# Patient Record
Sex: Female | Born: 1955 | Race: White | Hispanic: No | Marital: Married | Smoking: Former smoker
Health system: Southern US, Community
[De-identification: ages and names within clinical notes are randomized; demographics above are authoritative.]

## PROBLEM LIST (undated history)

## (undated) DIAGNOSIS — N898 Other specified noninflammatory disorders of vagina: Secondary | ICD-10-CM

## (undated) DIAGNOSIS — N941 Unspecified dyspareunia: Secondary | ICD-10-CM

## (undated) DIAGNOSIS — N905 Atrophy of vulva: Secondary | ICD-10-CM

## (undated) DIAGNOSIS — K5792 Diverticulitis of intestine, part unspecified, without perforation or abscess without bleeding: Secondary | ICD-10-CM

## (undated) DIAGNOSIS — M48061 Spinal stenosis, lumbar region without neurogenic claudication: Secondary | ICD-10-CM

## (undated) DIAGNOSIS — M5020 Other cervical disc displacement, unspecified cervical region: Secondary | ICD-10-CM

## (undated) DIAGNOSIS — S0300XA Dislocation of jaw, unspecified side, initial encounter: Secondary | ICD-10-CM

## (undated) HISTORY — DX: Unspecified dyspareunia: N94.10

## (undated) HISTORY — DX: Other specified noninflammatory disorders of vagina: N89.8

## (undated) HISTORY — DX: Atrophy of vulva: N90.5

## (undated) HISTORY — PX: COLONOSCOPY: SHX174

## (undated) HISTORY — DX: Other cervical disc displacement, unspecified cervical region: M50.20

## (undated) HISTORY — PX: MELANOMA EXCISION: SHX5266

## (undated) HISTORY — PX: TUBAL LIGATION: SHX77

## (undated) HISTORY — PX: BLADDER SUSPENSION: SHX72

---
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
## 1992-08-04 DIAGNOSIS — C439 Malignant melanoma of skin, unspecified: Secondary | ICD-10-CM

## 1992-08-04 HISTORY — DX: Malignant melanoma of skin, unspecified: C43.9

## 1992-08-04 HISTORY — PX: FLEXIBLE SIGMOIDOSCOPY: SHX1649

## 1992-08-04 HISTORY — PX: ABDOMINAL HYSTERECTOMY: SHX81

## 1992-08-04 HISTORY — PX: LAPAROSCOPIC OVARIAN CYSTECTOMY: SHX6248

## 1999-05-06 ENCOUNTER — Encounter: Admission: RE | Admit: 1999-05-06 | Discharge: 1999-05-06 | Payer: Self-pay | Admitting: Sports Medicine

## 1999-06-03 ENCOUNTER — Encounter: Admission: RE | Admit: 1999-06-03 | Discharge: 1999-06-03 | Payer: Self-pay | Admitting: Sports Medicine

## 1999-07-01 ENCOUNTER — Encounter: Admission: RE | Admit: 1999-07-01 | Discharge: 1999-07-01 | Payer: Self-pay | Admitting: Sports Medicine

## 1999-07-26 ENCOUNTER — Encounter: Admission: RE | Admit: 1999-07-26 | Discharge: 1999-07-26 | Payer: Self-pay | Admitting: Sports Medicine

## 2000-04-09 ENCOUNTER — Encounter: Admission: RE | Admit: 2000-04-09 | Discharge: 2000-04-09 | Payer: Self-pay | Admitting: Family Medicine

## 2004-11-21 ENCOUNTER — Ambulatory Visit: Payer: Self-pay

## 2005-07-30 ENCOUNTER — Ambulatory Visit: Payer: Self-pay | Admitting: Dermatology

## 2005-11-28 ENCOUNTER — Ambulatory Visit: Payer: Self-pay

## 2005-12-05 ENCOUNTER — Ambulatory Visit: Payer: Self-pay

## 2006-04-25 ENCOUNTER — Ambulatory Visit: Payer: Self-pay | Admitting: Physician Assistant

## 2006-12-11 ENCOUNTER — Inpatient Hospital Stay: Payer: Self-pay | Admitting: Internal Medicine

## 2006-12-16 ENCOUNTER — Ambulatory Visit: Payer: Self-pay

## 2007-03-19 ENCOUNTER — Ambulatory Visit: Payer: Self-pay | Admitting: Unknown Physician Specialty

## 2007-10-18 ENCOUNTER — Ambulatory Visit: Payer: Self-pay | Admitting: Dermatology

## 2008-03-07 ENCOUNTER — Ambulatory Visit: Payer: Self-pay

## 2008-08-14 ENCOUNTER — Ambulatory Visit: Payer: Self-pay | Admitting: Sports Medicine

## 2008-08-14 DIAGNOSIS — M722 Plantar fascial fibromatosis: Secondary | ICD-10-CM | POA: Insufficient documentation

## 2008-08-14 DIAGNOSIS — M7750 Other enthesopathy of unspecified foot: Secondary | ICD-10-CM | POA: Insufficient documentation

## 2008-08-14 DIAGNOSIS — M79609 Pain in unspecified limb: Secondary | ICD-10-CM | POA: Insufficient documentation

## 2009-03-08 ENCOUNTER — Ambulatory Visit: Payer: Self-pay

## 2010-01-16 ENCOUNTER — Ambulatory Visit: Payer: Self-pay | Admitting: Podiatry

## 2010-02-06 ENCOUNTER — Ambulatory Visit: Payer: Self-pay | Admitting: Internal Medicine

## 2010-03-13 ENCOUNTER — Ambulatory Visit: Payer: Self-pay

## 2010-04-26 ENCOUNTER — Encounter: Admission: RE | Admit: 2010-04-26 | Discharge: 2010-04-26 | Payer: Self-pay | Admitting: Internal Medicine

## 2010-09-09 ENCOUNTER — Other Ambulatory Visit: Payer: Self-pay | Admitting: Internal Medicine

## 2010-09-09 ENCOUNTER — Ambulatory Visit: Payer: Self-pay | Admitting: Internal Medicine

## 2011-04-01 ENCOUNTER — Ambulatory Visit: Payer: Self-pay

## 2012-03-30 ENCOUNTER — Ambulatory Visit: Payer: Self-pay | Admitting: General Practice

## 2012-04-21 ENCOUNTER — Ambulatory Visit: Payer: Self-pay | Admitting: Obstetrics and Gynecology

## 2013-04-28 ENCOUNTER — Ambulatory Visit: Payer: Self-pay | Admitting: Obstetrics and Gynecology

## 2013-05-03 ENCOUNTER — Ambulatory Visit: Payer: Self-pay | Admitting: Obstetrics and Gynecology

## 2013-07-12 ENCOUNTER — Ambulatory Visit: Payer: Self-pay | Admitting: Internal Medicine

## 2013-12-20 ENCOUNTER — Encounter: Payer: Self-pay | Admitting: Internal Medicine

## 2014-01-02 ENCOUNTER — Encounter: Payer: Self-pay | Admitting: Internal Medicine

## 2014-01-02 HISTORY — PX: CYSTOURETHROSCOPY: SHX476

## 2014-02-01 ENCOUNTER — Encounter: Payer: Self-pay | Admitting: Internal Medicine

## 2014-05-12 ENCOUNTER — Ambulatory Visit: Payer: Self-pay | Admitting: Obstetrics and Gynecology

## 2014-08-04 DIAGNOSIS — M8580 Other specified disorders of bone density and structure, unspecified site: Secondary | ICD-10-CM

## 2014-08-04 HISTORY — DX: Other specified disorders of bone density and structure, unspecified site: M85.80

## 2015-05-14 ENCOUNTER — Encounter: Payer: Self-pay | Admitting: *Deleted

## 2015-05-17 ENCOUNTER — Ambulatory Visit (INDEPENDENT_AMBULATORY_CARE_PROVIDER_SITE_OTHER): Payer: Managed Care, Other (non HMO) | Admitting: Obstetrics and Gynecology

## 2015-05-17 ENCOUNTER — Encounter: Payer: Self-pay | Admitting: Obstetrics and Gynecology

## 2015-05-17 VITALS — BP 96/76 | HR 96 | Ht 65.5 in | Wt 150.0 lb

## 2015-05-17 DIAGNOSIS — Z01419 Encounter for gynecological examination (general) (routine) without abnormal findings: Secondary | ICD-10-CM | POA: Diagnosis not present

## 2015-05-17 MED ORDER — PROGESTERONE MICRONIZED 200 MG PO CAPS: 200.0000 mg | ORAL_CAPSULE | Freq: Every day | ORAL | Status: DC

## 2015-05-17 MED ORDER — BUPROPION HCL ER (SR) 150 MG PO TB12: 150.0000 mg | ORAL_TABLET | Freq: Every day | ORAL | Status: DC

## 2015-05-17 MED ORDER — ESTRADIOL 0.5 MG PO TABS: 0.5000 mg | ORAL_TABLET | Freq: Every day | ORAL | Status: DC

## 2015-05-17 MED ORDER — BUPROPION HCL 75 MG PO TABS: 75.0000 mg | ORAL_TABLET | Freq: Two times a day (BID) | ORAL | Status: DC

## 2015-05-17 NOTE — Progress Notes (Signed)
Subjective:   Kathryn Hawkins is a 59 y.o. No obstetric history on file. Caucasian female here for a routine well-woman exam.  No LMP recorded. Patient is postmenopausal.    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  Past Medical History Past Medical History  Diagnosis Date  . Hypothyroidism   . Herniated disc, cervical   . Vaginal dryness   . Dyspareunia in female   . Vulvar atrophy     Past Surgical History Past Surgical History  Procedure Laterality Date  . Abdominal hysterectomy  1994    Gynecologic History No obstetric history on file.  No LMP recorded. Patient is postmenopausal. Contraception: post menopausal status Last Pap: 2015. Results were: normal Last mammogram: 2015. Results were: normal  Obstetric History OB History  No data available    Current Medications No current outpatient prescriptions on file prior to visit.   No current facility-administered medications on file prior to visit.    Review of Systems Patient denies any headaches, blurred vision, shortness of breath, chest pain, abdominal pain, problems with bowel movements, urination, or intercourse.  Objective:  BP 96/76 mmHg  Pulse 96  Ht 5' 5.5" (1.664 m)  Wt 150 lb (68.04 kg)  BMI 24.57 kg/m2 Physical Exam  General:  Well developed, well nourished, no acute distress. She is alert and oriented x3. Skin:  Warm and dry Neck:  Midline trachea, no thyromegaly or nodules Cardiovascular: Regular rate and rhythm, no murmur heard Lungs:   Effort normal, all lung fields clear to auscultation bilaterally Breasts:  No dominant palpable mass, retraction, or nipple discharge Abdomen:  Soft, non tender, no hepatosplenomegaly or masses Pelvic:  External genitalia is normal in appearance.  The vagina is normal in appearance. The cervix is bulbous, no CMT.  Thin prep pap is not done . Uterus is felt to be normal size, shape, and contour.  No adnexal masses or tenderness noted. Extremities:  No swelling or varicosities noted Psych:  She has a normal mood and affect  Assessment:   Healthy well-woman exam postmenopausaul HRT Anxiety under good control with SSRI osteopenia  Plan:  Routine screening labs obtained  F/U 1 year for AE, or sooner if needed Mammogram scheduled Sharhonda Atwood Valene Bors, CNM

## 2015-05-17 NOTE — Patient Instructions (Signed)
  Place annual gynecologic exam patient instructions here.  Thank you for enrolling in [facility]. Please follow the instructions below to securely access your online medical record. MyChart allows you to send messages to your doctor, view your test results, manage appointments, and more.   How Do I Sign Up? 1. In your Internet browser, go to [employer] and enter [email or web address]. 2. Click on the Sign Up Now link in the Sign In box. You will see the New Member Sign Up page. 3. Enter your MyChart Access Code exactly as it appears below. You will not need to use this code after you've completed the sign-up process. If you do not sign up before the expiration date, you must request a new code.  MyChart Access Code: [email or web address] Expires: 07/16/2015  8:46 AM  4. Enter your Social Security Number ([id number]) and Date of Birth (mm/dd/yyyy) as indicated and click Submit. You will be taken to the next sign-up page. 5. Create a MyChart ID. This will be your MyChart login ID and cannot be changed, so think of one that is secure and easy to remember. 6. Create a MyChart password. You can change your password at any time. 7. Enter your Password Reset Question and Answer. This can be used at a later time if you forget your password.  8. Enter your e-mail address. You will receive e-mail notification when new information is available in [place]. 9. Click Sign Up. You can now view your medical record.   Additional Information Remember, MyChart is NOT to be used for urgent needs. For medical emergencies, dial 911.

## 2015-05-18 ENCOUNTER — Other Ambulatory Visit: Payer: Self-pay | Admitting: Obstetrics and Gynecology

## 2015-05-18 DIAGNOSIS — M8580 Other specified disorders of bone density and structure, unspecified site: Secondary | ICD-10-CM | POA: Insufficient documentation

## 2015-05-18 LAB — COMPREHENSIVE METABOLIC PANEL
A/G RATIO: 1.8 (ref 1.1–2.5)
ALBUMIN: 4.2 g/dL (ref 3.6–4.8)
ALK PHOS: 58 IU/L (ref 39–117)
ALT: 11 IU/L (ref 0–32)
AST: 15 IU/L (ref 0–40)
BILIRUBIN TOTAL: 0.4 mg/dL (ref 0.0–1.2)
BUN / CREAT RATIO: 23 (ref 11–26)
BUN: 15 mg/dL (ref 8–27)
CHLORIDE: 102 mmol/L (ref 97–108)
CO2: 25 mmol/L (ref 18–29)
Calcium: 9.6 mg/dL (ref 8.7–10.3)
Creatinine, Ser: 0.65 mg/dL (ref 0.57–1.00)
GFR calc Af Amer: 111 mL/min/{1.73_m2} (ref >59)
GFR calc non Af Amer: 96 mL/min/{1.73_m2} (ref >59)
GLOBULIN, TOTAL: 2.4 g/dL (ref 1.5–4.5)
Glucose: 70 mg/dL (ref 65–99)
POTASSIUM: 4.5 mmol/L (ref 3.5–5.2)
SODIUM: 143 mmol/L (ref 134–144)
Total Protein: 6.6 g/dL (ref 6.0–8.5)

## 2015-05-18 LAB — LIPID PANEL
CHOLESTEROL TOTAL: 199 mg/dL (ref 100–199)
Chol/HDL Ratio: 2.9 ratio units (ref 0.0–4.4)
HDL: 68 mg/dL (ref >39)
LDL Calculated: 116 mg/dL — ABNORMAL HIGH (ref 0–99)
Triglycerides: 76 mg/dL (ref 0–149)
VLDL CHOLESTEROL CAL: 15 mg/dL (ref 5–40)

## 2015-05-18 LAB — VITAMIN D 25 HYDROXY (VIT D DEFICIENCY, FRACTURES): VIT D 25 HYDROXY: 36.2 ng/mL (ref 30.0–100.0)

## 2015-05-18 MED ORDER — CYCLOBENZAPRINE HCL 5 MG PO TABS: 5.0000 mg | ORAL_TABLET | Freq: Three times a day (TID) | ORAL | Status: DC | PRN

## 2015-05-22 ENCOUNTER — Telehealth: Payer: Self-pay | Admitting: *Deleted

## 2015-05-22 NOTE — Telephone Encounter (Signed)
Called pt notified of lab results, pt voiced understanding

## 2015-05-22 NOTE — Telephone Encounter (Signed)
-----   Message from Evonnie Pat, [place] sent at 05/18/2015  8:04 AM EDT ----- Please let her know all labs were normal except LDL was a little elevated- but she was not fasting, so no worries, also let her know I sent in her prescription for B-flexeril. Also reviewed her bone density and do not feel we need to repeat it until next year

## 2015-05-25 ENCOUNTER — Telehealth: Payer: Self-pay | Admitting: Obstetrics and Gynecology

## 2015-05-25 NOTE — Telephone Encounter (Signed)
PT CALLED Friday AND SHE WAS TOLD BY MNB THAT SHE NEEDED TO TAKE THE NEW MEDICATION THE SAME WAY SHE TOOK THE TOPICAL MEDICATION, BUT SHE ISNT SURE OR REMEBER HOW SHE DID THAT AND SHE ASKED THE PHARMACY AND THEY WERE NO HELP SO SHE WOULD LIKE A CALL BACK TO KNOW HOW SHE IS SUPPOSE TO TAKE THIS MEDICATION. CAN LEAVE A MESSAGE ON HER WORK NUMBER

## 2015-05-28 ENCOUNTER — Encounter (INDEPENDENT_AMBULATORY_CARE_PROVIDER_SITE_OTHER): Payer: Self-pay

## 2015-05-29 NOTE — Telephone Encounter (Signed)
MNB- she is confused about her medication When does she take estradiol and progesterone????

## 2015-05-30 NOTE — Telephone Encounter (Signed)
Have her take estrogen daily in am first 3 weeks of the months, then take progesterone at bedtime last three weeks of the month

## 2015-05-31 NOTE — Telephone Encounter (Signed)
Notified pt of directions she voiced understanding

## 2015-07-03 ENCOUNTER — Ambulatory Visit: Payer: Self-pay

## 2015-07-10 ENCOUNTER — Ambulatory Visit
Admission: RE | Admit: 2015-07-10 | Discharge: 2015-07-10 | Disposition: A | Discharge status: Home / Self Care | Payer: Managed Care, Other (non HMO) | Source: Ambulatory Visit | Attending: Obstetrics and Gynecology | Admitting: Obstetrics and Gynecology

## 2015-07-10 DIAGNOSIS — Z01419 Encounter for gynecological examination (general) (routine) without abnormal findings: Secondary | ICD-10-CM

## 2015-07-10 DIAGNOSIS — Z1231 Encounter for screening mammogram for malignant neoplasm of breast: Secondary | ICD-10-CM | POA: Insufficient documentation

## 2015-08-05 DIAGNOSIS — M51369 Other intervertebral disc degeneration, lumbar region without mention of lumbar back pain or lower extremity pain: Secondary | ICD-10-CM

## 2015-08-05 DIAGNOSIS — M5136 Other intervertebral disc degeneration, lumbar region: Secondary | ICD-10-CM

## 2015-08-05 HISTORY — DX: Other intervertebral disc degeneration, lumbar region without mention of lumbar back pain or lower extremity pain: M51.369

## 2015-08-05 HISTORY — DX: Other intervertebral disc degeneration, lumbar region: M51.36

## 2016-02-29 ENCOUNTER — Other Ambulatory Visit: Payer: Self-pay | Admitting: Internal Medicine

## 2016-02-29 DIAGNOSIS — M5116 Intervertebral disc disorders with radiculopathy, lumbar region: Secondary | ICD-10-CM

## 2016-03-13 ENCOUNTER — Ambulatory Visit
Admission: RE | Admit: 2016-03-13 | Discharge: 2016-03-13 | Disposition: A | Discharge status: Home / Self Care | Payer: Managed Care, Other (non HMO) | Source: Ambulatory Visit | Attending: Internal Medicine | Admitting: Internal Medicine

## 2016-03-13 DIAGNOSIS — M5136 Other intervertebral disc degeneration, lumbar region: Secondary | ICD-10-CM | POA: Insufficient documentation

## 2016-03-13 DIAGNOSIS — M4806 Spinal stenosis, lumbar region: Secondary | ICD-10-CM | POA: Diagnosis not present

## 2016-03-13 DIAGNOSIS — K8020 Calculus of gallbladder without cholecystitis without obstruction: Secondary | ICD-10-CM | POA: Insufficient documentation

## 2016-03-13 DIAGNOSIS — M5116 Intervertebral disc disorders with radiculopathy, lumbar region: Secondary | ICD-10-CM | POA: Diagnosis not present

## 2016-05-23 ENCOUNTER — Other Ambulatory Visit: Payer: Self-pay | Admitting: Obstetrics and Gynecology

## 2016-08-05 ENCOUNTER — Other Ambulatory Visit: Payer: Self-pay | Admitting: Obstetrics and Gynecology

## 2016-08-05 ENCOUNTER — Other Ambulatory Visit: Payer: Self-pay | Admitting: Internal Medicine

## 2016-08-05 DIAGNOSIS — Z1231 Encounter for screening mammogram for malignant neoplasm of breast: Secondary | ICD-10-CM

## 2016-09-03 ENCOUNTER — Ambulatory Visit
Admission: RE | Admit: 2016-09-03 | Discharge: 2016-09-03 | Disposition: A | Discharge status: Home / Self Care | Payer: Managed Care, Other (non HMO) | Source: Ambulatory Visit | Attending: Internal Medicine | Admitting: Internal Medicine

## 2016-09-03 DIAGNOSIS — Z1231 Encounter for screening mammogram for malignant neoplasm of breast: Secondary | ICD-10-CM | POA: Insufficient documentation

## 2016-09-04 ENCOUNTER — Telehealth: Payer: Self-pay | Admitting: *Deleted

## 2016-09-04 ENCOUNTER — Other Ambulatory Visit: Payer: Self-pay | Admitting: Obstetrics and Gynecology

## 2016-09-04 DIAGNOSIS — R928 Other abnormal and inconclusive findings on diagnostic imaging of breast: Secondary | ICD-10-CM

## 2016-09-04 NOTE — Telephone Encounter (Signed)
Spoke with pt , pt wants to be contacted on her [phone]

## 2016-09-04 NOTE — Telephone Encounter (Signed)
-----   Message from Joylene Igo, [place] sent at 09/04/2016 12:40 PM EST ----- LM to call about MMG, needs additional imaging on left breast, ? mass

## 2016-09-11 ENCOUNTER — Ambulatory Visit: Payer: Managed Care, Other (non HMO)

## 2016-09-12 ENCOUNTER — Ambulatory Visit
Admission: RE | Admit: 2016-09-12 | Discharge: 2016-09-12 | Disposition: A | Discharge status: Home / Self Care | Payer: Managed Care, Other (non HMO) | Source: Ambulatory Visit | Attending: Obstetrics and Gynecology | Admitting: Obstetrics and Gynecology

## 2016-09-12 DIAGNOSIS — R928 Other abnormal and inconclusive findings on diagnostic imaging of breast: Secondary | ICD-10-CM

## 2016-09-18 ENCOUNTER — Ambulatory Visit: Payer: Managed Care, Other (non HMO)

## 2016-11-11 ENCOUNTER — Encounter: Payer: Managed Care, Other (non HMO) | Admitting: Obstetrics and Gynecology

## 2016-12-25 ENCOUNTER — Ambulatory Visit (INDEPENDENT_AMBULATORY_CARE_PROVIDER_SITE_OTHER): Payer: Managed Care, Other (non HMO) | Admitting: Obstetrics and Gynecology

## 2016-12-25 ENCOUNTER — Encounter: Payer: Self-pay | Admitting: Obstetrics and Gynecology

## 2016-12-25 VITALS — BP 89/63 | HR 102 | Ht 65.5 in | Wt 152.7 lb

## 2016-12-25 DIAGNOSIS — Z01419 Encounter for gynecological examination (general) (routine) without abnormal findings: Secondary | ICD-10-CM | POA: Diagnosis not present

## 2016-12-25 DIAGNOSIS — E559 Vitamin D deficiency, unspecified: Secondary | ICD-10-CM | POA: Diagnosis not present

## 2016-12-25 NOTE — Progress Notes (Signed)
Subjective:   Kathryn Hawkins is a 61 y.o. G36P2 Caucasian female here for a routine well-woman exam.  No LMP recorded. Patient has had a hysterectomy.    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  Past Medical History Past Medical History:  Diagnosis Date  . Dyspareunia in female   . Herniated disc, cervical   . Hypothyroidism   . Vaginal dryness   . Vulvar atrophy     Past Surgical History Past Surgical History:  Procedure Laterality Date  . ABDOMINAL HYSTERECTOMY  1994    Gynecologic History G2P2  No LMP recorded. Patient has had a hysterectomy. Contraception: post menopausal status Last Pap: ?Marland Kitchen Results were: normal Last mammogram: 2018. Results were: normal   Obstetric History OB History  Gravida Para Term Preterm AB Living  2 2          SAB TAB Ectopic Multiple Live Births               # Outcome Date GA Lbr Len/2nd Weight Sex Delivery Anes PTL Lv  2 Para 1982    M Vag-Spont     1 Para 1978    M Vag-Spont         Current Medications Current Outpatient Prescriptions on File Prior to Visit  Medication Sig Dispense Refill  . buPROPion (WELLBUTRIN SR) 150 MG 12 hr tablet Take 1 tablet (150 mg total) by mouth daily. 90 tablet 3  . buPROPion (WELLBUTRIN) 75 MG tablet TAKE ONE TABLET BY MOUTH TWICE DAILY 180 tablet 2  . calcium citrate-vitamin D (CITRACAL+D) 315-200 MG-UNIT tablet Take 1 tablet by mouth 2 (two) times daily.    . cyclobenzaprine (FLEXERIL) 5 MG tablet Take 1 tablet (5 mg total) by mouth 3 (three) times daily as  needed for muscle spasms. 90 tablet 2  . estradiol (ESTRACE) 0.5 MG tablet TAKE ONE TABLET BY MOUTH EVERY DAY 90 tablet 2  . progesterone (PROMETRIUM) 200 MG capsule TAKE 1 CAPSULE  BY MOUTH EVERY DAY 90 capsule 2   No current facility-administered medications on file prior to visit.     Review of Systems Patient denies any headaches, blurred vision, shortness of breath, chest pain, abdominal pain, problems with bowel movements, urination, or intercourse.  Objective:  BP (!) 89/63   Pulse (!) 102   Ht 5' 5.5" (1.664 m)   Wt 152 lb 11.2 oz (69.3 kg)   BMI 25.02 kg/m  Physical Exam  General:  Well developed, well nourished, no acute distress. She is alert and oriented x3. Skin:  Warm and dry Neck:  Midline trachea, no thyromegaly or nodules Cardiovascular: Regular rate and rhythm, no murmur heard Lungs:  Effort normal, all lung fields clear to auscultation bilaterally Breasts:  No dominant palpable mass, retraction, or nipple discharge Abdomen:  Soft, non tender, no hepatosplenomegaly or masses Pelvic:  External genitalia is normal in appearance.  The vagina is normal in appearance with mild atrophy. The cervix is bulbous, no CMT.  Thin prep pap is not done. Uterus is surgically absent.  No adnexal masses or tenderness noted. Extremities:  No swelling  or varicosities noted Psych:  She has a normal mood and affect  Assessment:   Healthy well-woman exam Vitamin d deficiency  Plan:   F/U 1 year for AE, or sooner if needed Mammogram done this year.  [name], CNM

## 2016-12-25 NOTE — Patient Instructions (Signed)
  Place annual gynecologic exam patient instructions here.  Thank you for enrolling in [facility]. Please follow the instructions below to securely access your online medical record. MyChart allows you to send messages to your doctor, view your test results, manage appointments, and more.   How Do I Sign Up? 1. In your Internet browser, go to [employer] and enter [email or web address]. 2. Click on the Sign Up Now link in the Sign In box. You will see the New Member Sign Up page. 3. Enter your MyChart Access Code exactly as it appears below. You will not need to use this code after you've completed the sign-up process. If you do not sign up before the expiration date, you must request a new code.  MyChart Access Code: [email or web address] Expires: 02/23/2017  8:26 AM  4. Enter your Social Security Number ([id number]) and Date of Birth (mm/dd/yyyy) as indicated and click Submit. You will be taken to the next sign-up page. 5. Create a MyChart ID. This will be your MyChart login ID and cannot be changed, so think of one that is secure and easy to remember. 6. Create a MyChart password. You can change your password at any time. 7. Enter your Password Reset Question and Answer. This can be used at a later time if you forget your password.  8. Enter your e-mail address. You will receive e-mail notification when new information is available in [facility]. 9. Click Sign Up. You can now view your medical record.   Additional Information Remember, MyChart is NOT to be used for urgent needs. For medical emergencies, dial 911.

## 2017-03-20 ENCOUNTER — Ambulatory Visit: Admit: 2017-03-20 | Payer: Managed Care, Other (non HMO) | Admitting: Unknown Physician Specialty

## 2017-03-20 SURGERY — COLONOSCOPY WITH PROPOFOL
Anesthesia: General

## 2017-03-31 ENCOUNTER — Ambulatory Visit: Payer: Self-pay | Admitting: Medical

## 2017-03-31 VITALS — BP 124/78 | HR 104 | Temp 98.4°F

## 2017-03-31 DIAGNOSIS — N390 Urinary tract infection, site not specified: Secondary | ICD-10-CM

## 2017-03-31 DIAGNOSIS — R319 Hematuria, unspecified: Principal | ICD-10-CM

## 2017-03-31 LAB — POCT URINALYSIS DIPSTICK
Bilirubin, UA: NEGATIVE
Glucose, UA: NEGATIVE
Ketones, UA: NEGATIVE
NITRITE UA: NEGATIVE
PROTEIN UA: NEGATIVE
SPEC GRAV UA: 1.01 (ref 1.010–1.025)
UROBILINOGEN UA: 0.2 U/dL
pH, UA: 6 (ref 5.0–8.0)

## 2017-03-31 NOTE — Progress Notes (Signed)
   Subjective:    Patient ID: Kathryn Hawkins, female    DOB: 10-30-1955, 61 y.o.   MRN: [id number]  HPI 61 yo female Started Monday or Tuesday of last week, took  AZO x 3-4 days, stopped it and current symptoms are frequency, spasms and a little burning on urination, Took Advil 200 mg at 7:30am. Patient prefers not to take an antibiotic at this time until she knows what the culture and sensitivity will result in.   Review of Systems  Constitutional: Negative for chills, fatigue and fever.  HENT: Negative for congestion and sore throat.   Eyes: Negative for discharge and itching.  Respiratory: Negative for cough and shortness of breath.   Cardiovascular: Negative for chest pain, palpitations and leg swelling.  Gastrointestinal: Negative for abdominal pain.  Endocrine: Negative for polydipsia and polyphagia.  Genitourinary: Positive for decreased urine volume, dysuria, frequency and urgency.  Musculoskeletal: Negative for back pain.  Neurological: Positive for headaches. Negative for dizziness and syncope.  Hematological: Negative for adenopathy.  Psychiatric/Behavioral: Negative for agitation and behavioral problems. The patient is not nervous/anxious.    She feels the headaches are due to school starting up and stress.    Objective:   Physical Exam  Constitutional: She appears well-developed and well-nourished.  HENT:  Head: Normocephalic and atraumatic.  Nose: Rhinorrhea present.  Eyes: Pupils are equal, round, and reactive to light. Conjunctivae and EOM are normal.  Neck: Normal range of motion. Neck supple.  Abdominal: Soft. Bowel sounds are normal. She exhibits no distension. There is no tenderness.  Lymphadenopathy:    She has no cervical adenopathy.  Nursing note and vitals reviewed.    No CVA tenderness   Urine dip mod leuks Mod blood Assessment & Plan:  Urinary tract infection , patient does not want to take an antibiotic at this time. She does not want to start an  antibiotic until she knows what will work. Reviewed risks and benefits of antibiotics and risk of delay treatment possibly leading to a kidney infection. Reviewed with patient back pain or fever to call the office. Drink plenty of water. Call in the morning if you decides to take an antibiotic.Patient verbalizes understanding and has no questions at discharge.  Patient called and would like macrobid.  Progesterone not placed this encounter. Meds ordered this encounter  Medications  . progesterone (PROMETRIUM) 200 MG capsule    Sig: Take by mouth.  . nitrofurantoin, macrocrystal-monohydrate, (MACROBID) 100 MG capsule    Sig: Take 1 capsule (100 mg total) by mouth 2 (two) times daily.    Dispense:  14 capsule    Refill:  0

## 2017-03-31 NOTE — Patient Instructions (Signed)
Drink plenty of water. Call if you would like to start an antibiotic.   Urinary Tract Infection, Adult A urinary tract infection (UTI) is an infection of any part of the urinary tract. The urinary tract includes the:  Kidneys.  Ureters.  Bladder.  Urethra.  These organs make, store, and get rid of pee (urine) in the body. Follow these instructions at home:  Take over-the-counter and prescription medicines only as told by your doctor.  If you were prescribed an antibiotic medicine, take it as told by your doctor. Do not stop taking the antibiotic even if you start to feel better.  Avoid the following drinks: ? Alcohol. ? Caffeine. ? Tea. ? Carbonated drinks.  Drink enough fluid to keep your pee clear or pale yellow.  Keep all follow-up visits as told by your doctor. This is important.  Make sure to: ? Empty your bladder often and completely. Do not to hold pee for long periods of time. ? Empty your bladder before and after sex. ? Wipe from front to back after a bowel movement if you are female. Use each tissue one time when you wipe. Contact a doctor if:  You have back pain.  You have a fever.  You feel sick to your stomach (nauseous).  You throw up (vomit).  Your symptoms do not get better after 3 days.  Your symptoms go away and then come back. Get help right away if:  You have very bad back pain.  You have very bad lower belly (abdominal) pain.  You are throwing up and cannot keep down any medicines or water. This information is not intended to replace advice given to you by your health care provider. Make sure you discuss any questions you have with your health care provider. Document Released: 01/07/2008 Document Revised: 12/27/2015 Document Reviewed: 06/11/2015 Elsevier Interactive Patient Education  [employer].

## 2017-04-01 MED ORDER — NITROFURANTOIN MONOHYD MACRO 100 MG PO CAPS: 100.0000 mg | ORAL_CAPSULE | Freq: Two times a day (BID) | ORAL | 0 refills | Status: DC

## 2017-04-02 LAB — URINE CULTURE

## 2017-04-10 ENCOUNTER — Telehealth: Payer: Self-pay

## 2017-04-10 NOTE — Telephone Encounter (Signed)
Spoke with pt who states that her UTI "seems to be under control."  Informed her that her Urine culture is positive for E. Coli and that the bacteria is sensitive to the current prescription regimen from [facility], PA-C. Pt verbalizes understanding.

## 2017-04-10 NOTE — Telephone Encounter (Signed)
Pt Urine culture shows positive for E. Coli. Pt is taking Macrobid, which bacteria is sensitive to, per Daryll Drown, PA-C's encounter notes.

## 2017-04-17 ENCOUNTER — Ambulatory Visit: Admit: 2017-04-17 | Payer: Managed Care, Other (non HMO) | Admitting: Unknown Physician Specialty

## 2017-04-17 SURGERY — COLONOSCOPY WITH PROPOFOL
Anesthesia: General

## 2017-05-29 ENCOUNTER — Ambulatory Visit: Payer: Self-pay

## 2017-05-29 ENCOUNTER — Other Ambulatory Visit: Payer: Self-pay | Admitting: Obstetrics and Gynecology

## 2017-05-29 DIAGNOSIS — Z23 Encounter for immunization: Secondary | ICD-10-CM

## 2017-08-04 ENCOUNTER — Other Ambulatory Visit: Payer: Self-pay

## 2017-08-04 ENCOUNTER — Encounter: Payer: Self-pay | Admitting: Emergency Medicine

## 2017-08-04 ENCOUNTER — Emergency Department: Payer: Managed Care, Other (non HMO)

## 2017-08-04 ENCOUNTER — Observation Stay
Admission: EM | Admit: 2017-08-04 | Discharge: 2017-08-05 | Disposition: A | Discharge status: Home / Self Care | Payer: Managed Care, Other (non HMO) | Attending: Surgery | Admitting: Surgery

## 2017-08-04 DIAGNOSIS — K8000 Calculus of gallbladder with acute cholecystitis without obstruction: Principal | ICD-10-CM | POA: Insufficient documentation

## 2017-08-04 DIAGNOSIS — E039 Hypothyroidism, unspecified: Secondary | ICD-10-CM | POA: Insufficient documentation

## 2017-08-04 DIAGNOSIS — K660 Peritoneal adhesions (postprocedural) (postinfection): Secondary | ICD-10-CM | POA: Insufficient documentation

## 2017-08-04 DIAGNOSIS — M5020 Other cervical disc displacement, unspecified cervical region: Secondary | ICD-10-CM | POA: Insufficient documentation

## 2017-08-04 DIAGNOSIS — Z79899 Other long term (current) drug therapy: Secondary | ICD-10-CM | POA: Insufficient documentation

## 2017-08-04 DIAGNOSIS — Z803 Family history of malignant neoplasm of breast: Secondary | ICD-10-CM | POA: Diagnosis not present

## 2017-08-04 DIAGNOSIS — M8580 Other specified disorders of bone density and structure, unspecified site: Secondary | ICD-10-CM | POA: Diagnosis not present

## 2017-08-04 DIAGNOSIS — K810 Acute cholecystitis: Secondary | ICD-10-CM | POA: Diagnosis not present

## 2017-08-04 DIAGNOSIS — R109 Unspecified abdominal pain: Secondary | ICD-10-CM

## 2017-08-04 DIAGNOSIS — Z90710 Acquired absence of both cervix and uterus: Secondary | ICD-10-CM | POA: Diagnosis not present

## 2017-08-04 DIAGNOSIS — Z79890 Hormone replacement therapy (postmenopausal): Secondary | ICD-10-CM | POA: Insufficient documentation

## 2017-08-04 DIAGNOSIS — Z880 Allergy status to penicillin: Secondary | ICD-10-CM | POA: Insufficient documentation

## 2017-08-04 HISTORY — DX: Diverticulitis of intestine, part unspecified, without perforation or abscess without bleeding: K57.92

## 2017-08-04 LAB — COMPREHENSIVE METABOLIC PANEL
ALT: 16 U/L (ref 14–54)
ANION GAP: 9 (ref 5–15)
AST: 21 U/L (ref 15–41)
Albumin: 4.2 g/dL (ref 3.5–5.0)
Alkaline Phosphatase: 56 U/L (ref 38–126)
BILIRUBIN TOTAL: 0.7 mg/dL (ref 0.3–1.2)
BUN: 18 mg/dL (ref 6–20)
CO2: 25 mmol/L (ref 22–32)
Calcium: 10.3 mg/dL (ref 8.9–10.3)
Chloride: 104 mmol/L (ref 101–111)
Creatinine, Ser: 0.63 mg/dL (ref 0.44–1.00)
Glucose, Bld: 131 mg/dL — ABNORMAL HIGH (ref 65–99)
POTASSIUM: 3.7 mmol/L (ref 3.5–5.1)
Sodium: 138 mmol/L (ref 135–145)
TOTAL PROTEIN: 7.5 g/dL (ref 6.5–8.1)

## 2017-08-04 LAB — URINALYSIS, COMPLETE (UACMP) WITH MICROSCOPIC
Bacteria, UA: NONE SEEN
Bilirubin Urine: NEGATIVE
GLUCOSE, UA: NEGATIVE mg/dL
Ketones, ur: 5 mg/dL — AB
NITRITE: NEGATIVE
PROTEIN: NEGATIVE mg/dL
SPECIFIC GRAVITY, URINE: 1.019 (ref 1.005–1.030)
pH: 7 (ref 5.0–8.0)

## 2017-08-04 LAB — CBC
HEMATOCRIT: 40.4 % (ref 35.0–47.0)
Hemoglobin: 14 g/dL (ref 12.0–16.0)
MCH: 32.7 pg (ref 26.0–34.0)
MCHC: 34.7 g/dL (ref 32.0–36.0)
MCV: 94.2 fL (ref 80.0–100.0)
Platelets: 338 10*3/uL (ref 150–440)
RBC: 4.28 MIL/uL (ref 3.80–5.20)
RDW: 12.4 % (ref 11.5–14.5)
WBC: 10.2 10*3/uL (ref 3.6–11.0)

## 2017-08-04 LAB — LIPASE, BLOOD: Lipase: 44 U/L (ref 11–51)

## 2017-08-04 LAB — SURGICAL PCR SCREEN
MRSA, PCR: NEGATIVE
STAPHYLOCOCCUS AUREUS: POSITIVE — AB

## 2017-08-04 LAB — TROPONIN I: Troponin I: <0.03 ng/mL (ref <0.03)

## 2017-08-04 MED ORDER — MORPHINE SULFATE (PF) 4 MG/ML IV SOLN: 4.0000 mg | Freq: Once | INTRAVENOUS | Status: DC

## 2017-08-04 MED ORDER — CHLORHEXIDINE GLUCONATE CLOTH 2 % EX PADS
6.0000 | MEDICATED_PAD | Freq: Once | CUTANEOUS | Status: AC
  Administered 2017-08-04: 6 via TOPICAL

## 2017-08-04 MED ORDER — CIPROFLOXACIN IN D5W 400 MG/200ML IV SOLN
400.0000 mg | Freq: Two times a day (BID) | INTRAVENOUS | Status: DC
  Administered 2017-08-04 – 2017-08-05 (×2): 400 mg via INTRAVENOUS
  Filled 2017-08-04 (×3): qty 200

## 2017-08-04 MED ORDER — OXYCODONE-ACETAMINOPHEN 5-325 MG PO TABS: 1.0000 | ORAL_TABLET | ORAL | Status: DC | PRN

## 2017-08-04 MED ORDER — MORPHINE SULFATE (PF) 4 MG/ML IV SOLN
INTRAVENOUS | Status: AC
  Administered 2017-08-04: 4 mg via INTRAVENOUS
  Filled 2017-08-04: qty 1

## 2017-08-04 MED ORDER — CEFTRIAXONE SODIUM IN DEXTROSE 20 MG/ML IV SOLN
1.0000 g | Freq: Once | INTRAVENOUS | Status: AC
  Administered 2017-08-04: 1 g via INTRAVENOUS
  Filled 2017-08-04: qty 50

## 2017-08-04 MED ORDER — KETOROLAC TROMETHAMINE 30 MG/ML IJ SOLN
30.0000 mg | Freq: Four times a day (QID) | INTRAMUSCULAR | Status: DC
  Administered 2017-08-04 – 2017-08-05 (×4): 30 mg via INTRAVENOUS
  Filled 2017-08-04 (×4): qty 1

## 2017-08-04 MED ORDER — KCL-LACTATED RINGERS-D5W 20 MEQ/L IV SOLN
INTRAVENOUS | Status: DC
  Administered 2017-08-04 – 2017-08-05 (×3): via INTRAVENOUS
  Filled 2017-08-04 (×5): qty 1000

## 2017-08-04 MED ORDER — ONDANSETRON HCL 4 MG/2ML IJ SOLN: 4.0000 mg | Freq: Four times a day (QID) | INTRAMUSCULAR | Status: DC | PRN

## 2017-08-04 MED ORDER — MORPHINE SULFATE (PF) 4 MG/ML IV SOLN
4.0000 mg | Freq: Once | INTRAVENOUS | Status: AC
  Administered 2017-08-04: 4 mg via INTRAVENOUS

## 2017-08-04 MED ORDER — ONDANSETRON 4 MG PO TBDP: 4.0000 mg | ORAL_TABLET | Freq: Four times a day (QID) | ORAL | Status: DC | PRN

## 2017-08-04 MED ORDER — ONDANSETRON 4 MG PO TBDP: 4.0000 mg | ORAL_TABLET | Freq: Once | ORAL | Status: DC | PRN

## 2017-08-04 MED ORDER — ONDANSETRON HCL 4 MG/2ML IJ SOLN
4.0000 mg | Freq: Once | INTRAMUSCULAR | Status: AC
  Administered 2017-08-04: 4 mg via INTRAVENOUS
  Filled 2017-08-04: qty 2

## 2017-08-04 MED ORDER — CHLORHEXIDINE GLUCONATE CLOTH 2 % EX PADS
6.0000 | MEDICATED_PAD | Freq: Once | CUTANEOUS | Status: AC
  Administered 2017-08-05: 6 via TOPICAL

## 2017-08-04 MED ORDER — MORPHINE SULFATE (PF) 4 MG/ML IV SOLN
INTRAVENOUS | Status: AC
  Filled 2017-08-04: qty 1

## 2017-08-04 MED ORDER — MORPHINE SULFATE (PF) 4 MG/ML IV SOLN
4.0000 mg | Freq: Once | INTRAVENOUS | Status: AC
  Administered 2017-08-04: 4 mg via INTRAVENOUS
  Filled 2017-08-04: qty 1

## 2017-08-04 MED ORDER — CIPROFLOXACIN IN D5W 400 MG/200ML IV SOLN
INTRAVENOUS | Status: AC
  Filled 2017-08-04: qty 200

## 2017-08-04 MED ORDER — FENTANYL CITRATE (PF) 100 MCG/2ML IJ SOLN
50.0000 ug | INTRAMUSCULAR | Status: DC | PRN
  Administered 2017-08-05: 50 ug via INTRAVENOUS
  Filled 2017-08-04: qty 2

## 2017-08-04 NOTE — ED Triage Notes (Signed)
Pt in via POV with complaints of sudden onset epigastric pain, radiating around to back.  Pt reports some N/V, pt appears uncomfortable in triage.  Vitals WDL.

## 2017-08-04 NOTE — ED Notes (Addendum)
Returned from [place] scan

## 2017-08-04 NOTE — ED Provider Notes (Addendum)
[facility] Emergency Department Provider Note  ____________________________________________   I have reviewed the triage vital signs and the nursing notes. Where available I have reviewed prior notes and, if possible and indicated, outside hospital notes.    HISTORY  Chief Complaint Abdominal Pain    HPI Kathryn Hawkins is a 62 y.o. female who presents today complaining of right upper quadrant abdominal pain and nausea, started this morning, woke her from bed.  Patient states she has had other episodes like this but not to this degree or extent.  Radiates towards her back.  Denies any fever chills.  Denies any chest pain shortness of breath.  Not pleuritic.  Has not tried to eat anything.  Has had decreased appetite.     Location: Sharp right upper quadrant Radiation: Towards the back Quality: Sharp Duration: This morning Timing: Persistent Severity: Significant Associated sxs: Nausea PriorTreatment : None   Past Medical History:  Diagnosis Date  . Diverticulitis   . Dyspareunia in female   . Herniated disc, cervical   . Hypothyroidism   . Vaginal dryness   . Vulvar atrophy     Patient Active Problem List   Diagnosis Date Noted  . Osteopenia 05/18/2015  . METATARSALGIA 08/14/2008  . PLANTAR FASCIITIS, RIGHT 08/14/2008  . FOOT PAIN, RIGHT 08/14/2008    Past Surgical History:  Procedure Laterality Date  . ABDOMINAL HYSTERECTOMY  1994    Prior to Admission medications   Medication Sig Start Date End Date Taking? Authorizing Provider  buPROPion (WELLBUTRIN SR) 150 MG 12 hr tablet Take 1 tablet (150 mg total) by mouth daily. 05/17/15   Shambley, Melody N, CNM  buPROPion (WELLBUTRIN) 75 MG tablet TAKE ONE TABLET BY MOUTH TWICE DAILY 05/23/16   Shambley, Melody N, CNM  calcium citrate-vitamin D (CITRACAL+D) 315-200 MG-UNIT tablet Take 1 tablet by mouth 2 (two) times daily.    [provider]  cyclobenzaprine (FLEXERIL) 5 MG tablet  Take 1 tablet (5 mg total) by mouth 3 (three) times daily as needed for muscle spasms. 05/18/15   Shambley, Melody N, CNM  estradiol (ESTRACE) 0.5 MG tablet TAKE ONE TABLET BY MOUTH EVERY DAY 05/29/17   Shambley, Melody N, CNM  nitrofurantoin, macrocrystal-monohydrate, (MACROBID) 100 MG capsule Take 1 capsule (100 mg total) by mouth 2 (two) times daily. 04/01/17   Ratcliffe, Heather R, PA-C  progesterone (PROMETRIUM) 200 MG capsule Take by mouth.    [provider]  progesterone (PROMETRIUM) 200 MG capsule TAKE 1 CAPSULE BY MOUTH EVERY DAY 05/29/17   Shambley, Melody N, CNM    Allergies Penicillins  Family History  Problem Relation Age of Onset  . Breast cancer Paternal Grandmother 54    Social History Social History   Tobacco Use  . Smoking status: Never Smoker  . Smokeless tobacco: Never Used  Substance Use Topics  . Alcohol use: Yes  . Drug use: No    Review of Systems Constitutional: No fever/chills Eyes: No visual changes. ENT: No sore throat. No stiff neck no neck pain Cardiovascular: Denies chest pain. Respiratory: Denies shortness of breath. Gastrointestinal:   no vomiting.  No diarrhea.  No constipation. Genitourinary: Negative for dysuria. Musculoskeletal: Negative lower extremity swelling Skin: Negative for rash. Neurological: Negative for severe headaches, focal weakness or numbness.   ____________________________________________   PHYSICAL EXAM:  VITAL SIGNS: ED Triage Vitals  Enc Vitals Group     BP 08/04/17 1219 128/87     Pulse Rate 08/04/17 1219 83  Resp 08/04/17 1219 16     Temp 08/04/17 1219 (!) 97.1 F (36.2 C)     Temp Source 08/04/17 1219 Oral     SpO2 08/04/17 1219 100 %     Weight 08/04/17 1220 150 lb (68 kg)     Height 08/04/17 1220 5\' 5"  (1.651 m)     Head Circumference --      Peak Flow --      Pain Score 08/04/17 1231 10     Pain Loc --      Pain Edu? --      Excl. in [place]? --     Constitutional: Alert and oriented.  Well appearing and in no acute distress. Eyes: Conjunctivae are normal Head: Atraumatic HEENT: No congestion/rhinnorhea. Mucous membranes are moist.  Oropharynx non-erythematous Neck:   Nontender with no meningismus, no masses, no stridor Cardiovascular: Normal rate, regular rhythm. Grossly normal heart sounds.  Good peripheral circulation. Respiratory: Normal respiratory effort.  No retractions. Lungs CTAB. Abdominal: Soft and focal tenderness to palpation of the right upper quadrant voluntary guarding no distention. no rebound Back:  There is no focal tenderness or step off.  there is no midline tenderness there are no lesions noted. there is no CVA tenderness Musculoskeletal: No lower extremity tenderness, no upper extremity tenderness. No joint effusions, no DVT signs strong distal pulses no edema Neurologic:  Normal speech and language. No gross focal neurologic deficits are appreciated.  Skin:  Skin is warm, dry and intact. No rash noted. Psychiatric: Mood and affect are normal. Speech and behavior are normal.  ____________________________________________   LABS (all labs ordered are listed, but only abnormal results are displayed)  Labs Reviewed  COMPREHENSIVE METABOLIC PANEL - Abnormal; Notable for the following components:      Result Value   Glucose, Bld 131 (*)    All other components within normal limits  URINALYSIS, COMPLETE (UACMP) WITH MICROSCOPIC - Abnormal; Notable for the following components:   Color, Urine YELLOW (*)    APPearance TURBID (*)    Hgb urine dipstick SMALL (*)    Ketones, ur 5 (*)    Leukocytes, UA TRACE (*)    Squamous Epithelial / LPF 6-30 (*)    All other components within normal limits  LIPASE, BLOOD  CBC  TROPONIN I    Pertinent labs  results that were available during my care of the patient were reviewed by me and considered in my medical decision making (see chart for details). ____________________________________________  EKG  I  personally interpreted any EKGs ordered by me or triage Sinus rhythm rate 77 bpm no acute ST elevation no acute ST depression normal axis, nonspecific ST changes ____________________________________________  RADIOLOGY  Pertinent labs & imaging results that were available during my care of the patient were reviewed by me and considered in my medical decision making (see chart for details). If possible, patient and/or family made aware of any abnormal findings.  No results found. ____________________________________________    PROCEDURES  Procedure(s) performed: None  Procedures  Critical Care performed: None  ____________________________________________   INITIAL IMPRESSION / ASSESSMENT AND PLAN / ED COURSE  Pertinent labs & imaging results that were available during my care of the patient were reviewed by me and considered in my medical decision making (see chart for details).  Here with right upper quadrant abdominal pain which is focal, has had similar in the past.  Is likely gallbladder disease diverticulitis is a pathology she has also suffered from in the past  but this is a typically placed for that.  Low suspicion for AAA I do not palpate one but that certainly a consideration, low   suspicion for appendicitis.  We will obtain ultrasound blood work and reassess him feeling better after fluid and antiemetics anti-pain medication  ----------------------------------------- 2:29 PM on 08/04/2017 -----------------------------------------  Patient has required 2 different doses of pain medication, ultrasound is consistent with acute gallbladder disease, I have discussed with Dr. Rosana Hoes, who agrees with management and will admit patient he did request Rocephin.   ____________________________________________   FINAL CLINICAL IMPRESSION(S) / ED DIAGNOSES  Final diagnoses:  Abdominal pain      This chart was dictated using voice recognition software.  Despite best efforts to  proofread,  errors can occur which can change meaning.      Schuyler Amor, MD 08/04/17 1331    Schuyler Amor, MD 08/04/17 1430

## 2017-08-04 NOTE — H&P (Signed)
SURGICAL HISTORY & PHYSICAL (cpt [phone])  HISTORY OF PRESENT ILLNESS (HPI):  62 y.o. female presented to [facility] ED today for abdominal pain since early this morning. Patient reports she first noted RUQ and epigastric abdominal pain radiating to her back immediately upon awaking this morning. Thinking this could be what heartburn feels like, she tried taking Tums without any relief. Following onset of her abdominal pain, she experienced non-bloody emesis once at home and a second episode upon presenting to [facility] ED. She describes her pain has been somewhat controlled by morphine while in the ED, but returns a short time after she's received narcotic pain medication. She otherwise denies fever/chills, CP, or SOB and denies any prior similar episodes. Patient has previously experienced 2 - 3 episodes of sigmoid colonic diverticulitis, the last being 8 - 10 years ago, each managed with outpatient antibiotics, and patient recently underwent reportedly unremarkable colonoscopy. She also states she can walk up/down steps and several blocks without CP or SOB.  PAST MEDICAL HISTORY (PMH):  Past Medical History:  Diagnosis Date  . Diverticulitis   . Dyspareunia in female   . Herniated disc, cervical   . Hypothyroidism   . Vaginal dryness   . Vulvar atrophy     Reviewed. Otherwise negative.   PAST SURGICAL HISTORY ([place]):  Past Surgical History:  Procedure Laterality Date  . ABDOMINAL HYSTERECTOMY  1994    Reviewed. Otherwise negative.   MEDICATIONS:  Prior to Admission medications   Medication Sig Start Date End Date Taking? Authorizing Provider  buPROPion (WELLBUTRIN SR) 150 MG 12 hr tablet Take 1 tablet (150 mg total) by mouth daily. Patient taking differently: Take 150 mg by mouth daily. Alternate 150mg  and 75mg  daily 05/17/15  Yes Shambley, Melody N, CNM  buPROPion (WELLBUTRIN) 75 MG tablet TAKE ONE TABLET BY MOUTH TWICE DAILY Patient taking differently: Take 75MG  by mouth daily - alternate  with 150mg  every other day 05/23/16  Yes Shambley, Melody N, CNM  calcium carbonate (TUMS - DOSED IN MG ELEMENTAL CALCIUM) 500 MG chewable tablet Chew 2-4 tablets by mouth as needed for indigestion or heartburn.   Yes [provider]  estradiol (ESTRACE) 0.5 MG tablet TAKE ONE TABLET BY MOUTH EVERY DAY Patient taking differently: TAKE ONE TABLET BY MOUTH EVERY DAY for 21 days then take none for 7 days 05/29/17  Yes Shambley, Melody N, CNM  ibuprofen (ADVIL,MOTRIN) 200 MG tablet Take 200-400 mg by mouth every 6 (six) hours as needed.   Yes [provider]  progesterone (PROMETRIUM) 200 MG capsule TAKE 1 CAPSULE BY MOUTH EVERY DAY Patient taking differently: TAKE 1 CAPSULE BY MOUTH EVERY DAY for 21 days then none for 7 days 05/29/17  Yes Shambley, Melody N, CNM     ALLERGIES:  Allergies  Allergen Reactions  . Penicillins Other (See Comments)    Yeast Infection     SOCIAL HISTORY:  Social History   Socioeconomic History  . Marital status: Married    Spouse name: Not on file  . Number of children: Not on file  . Years of education: Not on file  . Highest education level: Not on file  Social Needs  . Financial resource strain: Not on file  . Food insecurity - worry: Not on file  . Food insecurity - inability: Not on file  . Transportation needs - medical: Not on file  . Transportation needs - non-medical: Not on file  Occupational History  . Not on file  Tobacco Use  . Smoking  status: Never Smoker  . Smokeless tobacco: Never Used  Substance and Sexual Activity  . Alcohol use: Yes  . Drug use: No  . Sexual activity: Yes  Other Topics Concern  . Not on file  Social History Narrative  . Not on file    The patient currently resides (home / rehab facility / nursing home): Home The patient normally is (ambulatory / bedbound): Ambulatory  FAMILY HISTORY:  Family History  Problem Relation Age of Onset  . Breast cancer Paternal Grandmother 5    Otherwise  negative.   REVIEW OF SYSTEMS:  Constitutional: denies any other weight loss, fever, chills, or sweats  Eyes: denies any other vision changes, history of eye injury  ENT: denies sore throat, hearing problems  Respiratory: denies shortness of breath, wheezing  Cardiovascular: denies chest pain, palpitations  Gastrointestinal: abdominal pain, N/V, and bowel function as per HPI  Genitourinary: denies burning with urination or urinary frequency Musculoskeletal: denies any other joint pains or cramps  Skin: Denies any other rashes or skin discolorations  Neurological: denies any other headache, dizziness, weakness  Psychiatric: denies any other depression, anxiety   All other review of systems were otherwise negative.  VITAL SIGNS:  Temp:  [97.1 F (36.2 C)] 97.1 F (36.2 C) (01/01 1219) Pulse Rate:  [58-102] 97 (01/01 1700) Resp:  [11-26] 26 (01/01 1700) BP: (102-131)/(64-103) 119/103 (01/01 1700) SpO2:  [95 %-100 %] 98 % (01/01 1700) Weight:  [150 lb (68 kg)] 150 lb (68 kg) (01/01 1220)     Height: 5\' 5"  (165.1 cm) Weight: 150 lb (68 kg) BMI (Calculated): 24.96   INTAKE/OUTPUT:  This shift: Total I/O In: 50 [IV Piggyback:50] Out: -   Last 2 shifts: @IOLAST2SHIFTS @  PHYSICAL EXAM:  Constitutional:  -- Normal body habitus  -- Awake, alert, and oriented x3, no apparent distress Eyes:  -- Pupils equally round and reactive to light  -- No scleral icterus, B/L no occular discharge Ear, nose, throat: -- Neck is FROM WNL -- No jugular venous distension  Pulmonary:  -- No wheezes or rhales -- Equal breath sounds bilaterally -- Breathing non-labored at rest Cardiovascular:  -- S1, S2 present  -- No pericardial rubs  Gastrointestinal:  -- Abdomen soft and non-distended with moderate RUQ and epigastric abdominal tenderness to palpation, no guarding or rebound tenderness -- No abdominal masses appreciated, pulsatile or otherwise  Musculoskeletal and Integumentary:  -- Wounds or  skin discoloration: None appreciated -- Extremities: B/L UE and LE FROM, hands and feet warm, no edema  Neurologic:  -- Motor function: Intact and symmetric -- Sensation: Intact and symmetric Psychiatric:  -- Mood and affect WNL  Labs:  CBC Latest Ref Rng & Units 08/04/2017  WBC 3.6 - 11.0 K/uL 10.2  Hemoglobin 12.0 - 16.0 g/dL 14.0  Hematocrit 35.0 - 47.0 % 40.4  Platelets 150 - 440 K/uL 338   CMP Latest Ref Rng & Units 08/04/2017 05/17/2015  Glucose 65 - 99 mg/dL 131(H) 70  BUN 6 - 20 mg/dL 18 15  Creatinine 0.44 - 1.00 mg/dL 0.63 0.65  Sodium 135 - 145 mmol/L 138 143  Potassium 3.5 - 5.1 mmol/L 3.7 4.5  Chloride 101 - 111 mmol/L 104 102  CO2 22 - 32 mmol/L 25 25  Calcium 8.9 - 10.3 mg/dL 10.3 9.6  Total Protein 6.5 - 8.1 g/dL 7.5 6.6  Total Bilirubin 0.3 - 1.2 mg/dL 0.7 0.4  Alkaline Phos 38 - 126 U/L 56 58  AST 15 - 41 U/L 21  15  ALT 14 - 54 U/L 16 11    Imaging studies:  Limited RUQ Abdominal Ultrasound (08/04/2017) - personally reviewed and discussed with patient and her family Cholelithiasis with mild gallbladder wall thickening  and pericholecystic fluid, suggestive of acute cholecystitis.  Common bile duct diameter measures 4 mm.  Assessment/Plan: (ICD-10's: K81.0) 62 y.o. female with acute calculous cholecystitis, complicated by pertinent comorbidities including hypothyroidism, a history of sigmoid colonic diverticulitis, and prior abdominal hysterectomy.    - pain control prn  - NPO, IVF, ceftriaxone             - CT findings discussed with patient and her family             - all risks, benefits, and alternatives to cholecystectomy were discussed with the patient and her family, all of their questions were answered to their expressed satisfaction, patient expresses she wishes to proceed, and informed consent was obtained.  - will plan for laparoscopic cholecystectomy pending OR availability  - if OR not available until late, will order clear liquids diet and plan  for laparoscopic cholecystectomy tomorrow morning  - medical management of comorbidities (home medications once tolerating PO)  - DVT prophylaxis, ambulation encouraged  All of the above findings and recommendations were discussed with the patient, her husband, and Dr. Burlene Arnt (ED physician), and all of pateint's and her family's questions were answered to their expressed satisfaction.  -- Marilynne Drivers Rosana Hoes, MD, [place]: [facility] - Partnering for exceptional care. Office: [phone]

## 2017-08-05 ENCOUNTER — Encounter
Admission: EM | Disposition: A | Discharge status: Home / Self Care | Payer: Self-pay | Source: Home / Self Care | Attending: Emergency Medicine

## 2017-08-05 ENCOUNTER — Observation Stay: Payer: Managed Care, Other (non HMO) | Admitting: Anesthesiology

## 2017-08-05 ENCOUNTER — Encounter: Payer: Self-pay | Admitting: Anesthesiology

## 2017-08-05 DIAGNOSIS — K8000 Calculus of gallbladder with acute cholecystitis without obstruction: Secondary | ICD-10-CM

## 2017-08-05 HISTORY — PX: CHOLECYSTECTOMY: SHX55

## 2017-08-05 LAB — COMPREHENSIVE METABOLIC PANEL
ALBUMIN: 3 g/dL — AB (ref 3.5–5.0)
ALT: 446 U/L — ABNORMAL HIGH (ref 14–54)
ANION GAP: 8 (ref 5–15)
AST: 535 U/L — AB (ref 15–41)
Alkaline Phosphatase: 75 U/L (ref 38–126)
BILIRUBIN TOTAL: 0.7 mg/dL (ref 0.3–1.2)
BUN: 12 mg/dL (ref 6–20)
CHLORIDE: 107 mmol/L (ref 101–111)
CO2: 25 mmol/L (ref 22–32)
Calcium: 8.4 mg/dL — ABNORMAL LOW (ref 8.9–10.3)
Creatinine, Ser: 0.68 mg/dL (ref 0.44–1.00)
GFR calc Af Amer: >60 mL/min (ref >60)
GFR calc non Af Amer: >60 mL/min (ref >60)
GLUCOSE: 117 mg/dL — AB (ref 65–99)
POTASSIUM: 3.8 mmol/L (ref 3.5–5.1)
Sodium: 140 mmol/L (ref 135–145)
TOTAL PROTEIN: 5.5 g/dL — AB (ref 6.5–8.1)

## 2017-08-05 LAB — CBC
HEMATOCRIT: 35.8 % (ref 35.0–47.0)
Hemoglobin: 12.4 g/dL (ref 12.0–16.0)
MCH: 32.8 pg (ref 26.0–34.0)
MCHC: 34.7 g/dL (ref 32.0–36.0)
MCV: 94.7 fL (ref 80.0–100.0)
Platelets: 258 10*3/uL (ref 150–440)
RBC: 3.78 MIL/uL — ABNORMAL LOW (ref 3.80–5.20)
RDW: 12.5 % (ref 11.5–14.5)
WBC: 9.3 10*3/uL (ref 3.6–11.0)

## 2017-08-05 SURGERY — LAPAROSCOPIC CHOLECYSTECTOMY
Anesthesia: General | Wound class: Clean Contaminated

## 2017-08-05 MED ORDER — FENTANYL CITRATE (PF) 250 MCG/5ML IJ SOLN
INTRAMUSCULAR | Status: AC
  Filled 2017-08-05: qty 5

## 2017-08-05 MED ORDER — DEXAMETHASONE SODIUM PHOSPHATE 10 MG/ML IJ SOLN
INTRAMUSCULAR | Status: DC | PRN
  Administered 2017-08-05: 5 mg via INTRAVENOUS

## 2017-08-05 MED ORDER — KETOROLAC TROMETHAMINE 30 MG/ML IJ SOLN
INTRAMUSCULAR | Status: AC
  Filled 2017-08-05: qty 1

## 2017-08-05 MED ORDER — GLYCOPYRROLATE 0.2 MG/ML IJ SOLN
INTRAMUSCULAR | Status: AC
  Filled 2017-08-05: qty 1

## 2017-08-05 MED ORDER — PROPOFOL 10 MG/ML IV BOLUS
INTRAVENOUS | Status: AC
  Filled 2017-08-05: qty 20

## 2017-08-05 MED ORDER — LACTATED RINGERS IV SOLN
INTRAVENOUS | Status: DC | PRN
  Administered 2017-08-05 (×2): via INTRAVENOUS

## 2017-08-05 MED ORDER — CHLORHEXIDINE GLUCONATE CLOTH 2 % EX PADS: 6.0000 | MEDICATED_PAD | Freq: Every day | CUTANEOUS | Status: DC

## 2017-08-05 MED ORDER — SUGAMMADEX SODIUM 200 MG/2ML IV SOLN
INTRAVENOUS | Status: AC
  Filled 2017-08-05: qty 2

## 2017-08-05 MED ORDER — LIDOCAINE HCL (PF) 2 % IJ SOLN
INTRAMUSCULAR | Status: AC
  Filled 2017-08-05: qty 10

## 2017-08-05 MED ORDER — SUGAMMADEX SODIUM 200 MG/2ML IV SOLN
INTRAVENOUS | Status: DC | PRN
  Administered 2017-08-05: 140 mg via INTRAVENOUS

## 2017-08-05 MED ORDER — ONDANSETRON HCL 4 MG/2ML IJ SOLN
INTRAMUSCULAR | Status: AC
  Filled 2017-08-05: qty 2

## 2017-08-05 MED ORDER — ONDANSETRON HCL 4 MG/2ML IJ SOLN: 4.0000 mg | Freq: Once | INTRAMUSCULAR | Status: DC | PRN

## 2017-08-05 MED ORDER — PROPOFOL 10 MG/ML IV BOLUS
INTRAVENOUS | Status: DC | PRN
  Administered 2017-08-05: 100 mg via INTRAVENOUS

## 2017-08-05 MED ORDER — ESMOLOL HCL 100 MG/10ML IV SOLN
INTRAVENOUS | Status: AC
  Filled 2017-08-05: qty 10

## 2017-08-05 MED ORDER — ACETAMINOPHEN 10 MG/ML IV SOLN
INTRAVENOUS | Status: DC | PRN
  Administered 2017-08-05: 1000 mg via INTRAVENOUS

## 2017-08-05 MED ORDER — FENTANYL CITRATE (PF) 100 MCG/2ML IJ SOLN: 25.0000 ug | INTRAMUSCULAR | Status: DC | PRN

## 2017-08-05 MED ORDER — FENTANYL CITRATE (PF) 100 MCG/2ML IJ SOLN
INTRAMUSCULAR | Status: DC | PRN
  Administered 2017-08-05 (×5): 50 ug via INTRAVENOUS

## 2017-08-05 MED ORDER — LIDOCAINE HCL (CARDIAC) 20 MG/ML IV SOLN
INTRAVENOUS | Status: DC | PRN
  Administered 2017-08-05: 50 mg via INTRAVENOUS

## 2017-08-05 MED ORDER — ROCURONIUM BROMIDE 50 MG/5ML IV SOLN
INTRAVENOUS | Status: AC
  Filled 2017-08-05: qty 1

## 2017-08-05 MED ORDER — MIDAZOLAM HCL 2 MG/2ML IJ SOLN
INTRAMUSCULAR | Status: AC
  Filled 2017-08-05: qty 2

## 2017-08-05 MED ORDER — ACETAMINOPHEN 10 MG/ML IV SOLN
INTRAVENOUS | Status: AC
  Filled 2017-08-05: qty 100

## 2017-08-05 MED ORDER — MUPIROCIN 2 % EX OINT
1.0000 "application " | TOPICAL_OINTMENT | Freq: Two times a day (BID) | CUTANEOUS | Status: DC
  Administered 2017-08-05 (×2): 1 via NASAL
  Filled 2017-08-05: qty 22

## 2017-08-05 MED ORDER — ESMOLOL HCL 100 MG/10ML IV SOLN
INTRAVENOUS | Status: DC | PRN
  Administered 2017-08-05: 30 mg via INTRAVENOUS
  Administered 2017-08-05: 20 mg via INTRAVENOUS

## 2017-08-05 MED ORDER — LIDOCAINE HCL 1 % IJ SOLN
INTRAMUSCULAR | Status: DC | PRN
  Administered 2017-08-05: 20 mL

## 2017-08-05 MED ORDER — ONDANSETRON HCL 4 MG/2ML IJ SOLN
INTRAMUSCULAR | Status: DC | PRN
  Administered 2017-08-05: 4 mg via INTRAVENOUS

## 2017-08-05 MED ORDER — SUCCINYLCHOLINE CHLORIDE 20 MG/ML IJ SOLN
INTRAMUSCULAR | Status: DC | PRN
  Administered 2017-08-05: 100 mg via INTRAVENOUS

## 2017-08-05 MED ORDER — ROCURONIUM BROMIDE 100 MG/10ML IV SOLN
INTRAVENOUS | Status: DC | PRN
  Administered 2017-08-05: 30 mg via INTRAVENOUS
  Administered 2017-08-05: 10 mg via INTRAVENOUS

## 2017-08-05 MED ORDER — BUPIVACAINE HCL (PF) 0.5 % IJ SOLN
INTRAMUSCULAR | Status: AC
  Filled 2017-08-05: qty 30

## 2017-08-05 MED ORDER — OXYCODONE-ACETAMINOPHEN 5-325 MG PO TABS: 1.0000 | ORAL_TABLET | ORAL | 0 refills | Status: DC | PRN

## 2017-08-05 MED ORDER — LIDOCAINE HCL (PF) 1 % IJ SOLN
INTRAMUSCULAR | Status: AC
  Filled 2017-08-05: qty 30

## 2017-08-05 MED ORDER — PHENYLEPHRINE HCL 10 MG/ML IJ SOLN
INTRAMUSCULAR | Status: DC | PRN
  Administered 2017-08-05 (×3): 100 ug via INTRAVENOUS

## 2017-08-05 MED ORDER — MIDAZOLAM HCL 2 MG/2ML IJ SOLN
INTRAMUSCULAR | Status: DC | PRN
  Administered 2017-08-05: 2 mg via INTRAVENOUS

## 2017-08-05 SURGICAL SUPPLY — 37 items
APPLICATOR ARISTA FLEXITIP XL (MISCELLANEOUS) ×2 IMPLANT
APPLIER CLIP ROT 10 11.4 M/L (STAPLE) ×2
CHLORAPREP W/TINT 26ML (MISCELLANEOUS) ×2 IMPLANT
CLIP APPLIE ROT 10 11.4 M/L (STAPLE) ×1 IMPLANT
DECANTER SPIKE VIAL GLASS SM (MISCELLANEOUS) ×4 IMPLANT
DERMABOND ADVANCED (GAUZE/BANDAGES/DRESSINGS) ×1
DERMABOND ADVANCED .7 DNX12 (GAUZE/BANDAGES/DRESSINGS) ×1 IMPLANT
DEVICE PMI PUNCTURE CLOSURE (MISCELLANEOUS) ×2 IMPLANT
DRESSING SURGICEL FIBRLLR 1X2 (HEMOSTASIS) IMPLANT
DRSG SURGICEL FIBRILLAR 1X2 (HEMOSTASIS)
ELECT REM PT RETURN 9FT ADLT (ELECTROSURGICAL) ×2
ELECTRODE REM PT RTRN 9FT ADLT (ELECTROSURGICAL) ×1 IMPLANT
GLOVE BIO SURGEON STRL SZ7 (GLOVE) ×6 IMPLANT
GLOVE BIOGEL PI IND STRL 7.5 (GLOVE) ×3 IMPLANT
GLOVE BIOGEL PI INDICATOR 7.5 (GLOVE) ×3
GOWN STRL REUS W/ TWL LRG LVL3 (GOWN DISPOSABLE) ×3 IMPLANT
GOWN STRL REUS W/TWL LRG LVL3 (GOWN DISPOSABLE) ×3
HEMOSTAT ARISTA ABSORB 3G PWDR (MISCELLANEOUS) ×2 IMPLANT
IRRIGATION STRYKERFLOW (MISCELLANEOUS) ×1 IMPLANT
IRRIGATOR STRYKERFLOW (MISCELLANEOUS) ×2
IV NS 1000ML (IV SOLUTION) ×1
IV NS 1000ML BAXH (IV SOLUTION) ×1 IMPLANT
KIT RM TURNOVER STRD PROC AR (KITS) ×2 IMPLANT
NEEDLE HYPO 22GX1.5 SAFETY (NEEDLE) ×2 IMPLANT
NEEDLE INSUFFLATION 14GA 120MM (NEEDLE) ×2 IMPLANT
NS IRRIG 1000ML POUR BTL (IV SOLUTION) ×2 IMPLANT
PACK LAP CHOLECYSTECTOMY (MISCELLANEOUS) ×2 IMPLANT
PENCIL ELECTRO HAND CTR (MISCELLANEOUS) ×2 IMPLANT
POUCH SPECIMEN RETRIEVAL 10MM (ENDOMECHANICALS) ×2 IMPLANT
SCISSORS METZENBAUM CVD 33 (INSTRUMENTS) ×2 IMPLANT
SLEEVE ENDOPATH XCEL 5M (ENDOMECHANICALS) ×4 IMPLANT
SUT MNCRL AB 4-0 PS2 18 (SUTURE) ×2 IMPLANT
SUT VICRYL 0 UR6 27IN ABS (SUTURE) ×2 IMPLANT
SUT VICRYL AB 3-0 FS1 BRD 27IN (SUTURE) ×2 IMPLANT
TROCAR XCEL NON-BLD 11X100MML (ENDOMECHANICALS) ×2 IMPLANT
TROCAR XCEL NON-BLD 5MMX100MML (ENDOMECHANICALS) ×2 IMPLANT
TUBING INSUFFLATION (TUBING) ×2 IMPLANT

## 2017-08-05 NOTE — Anesthesia Procedure Notes (Signed)
Procedure Name: Intubation Date/Time: 08/05/2017 1:33 PM Performed by: Rolla Plate, CRNA Pre-anesthesia Checklist: Patient identified, Emergency Drugs available, Suction available and Patient being monitored Patient Re-evaluated:Patient Re-evaluated prior to induction Oxygen Delivery Method: Circle system utilized Preoxygenation: Pre-oxygenation with 100% oxygen Induction Type: IV induction Ventilation: Mask ventilation without difficulty Laryngoscope Size: Miller and 2 Grade View: Grade I Tube type: Oral Tube size: 7.0 mm Number of attempts: 1 Airway Equipment and Method: Stylet Placement Confirmation: ETT inserted through vocal cords under direct vision Secured at: 21 cm Tube secured with: Tape Dental Injury: Teeth and Oropharynx as per pre-operative assessment

## 2017-08-05 NOTE — Progress Notes (Signed)
Discharge teaching given to patient, patient verbalized understanding and had no questions. Patient IV removed. Patient will be transported home by family. All patient belongings gathered prior to leaving.  

## 2017-08-05 NOTE — Anesthesia Post-op Follow-up Note (Signed)
Anesthesia QCDR form completed.        

## 2017-08-05 NOTE — Discharge Instructions (Signed)
In addition to included general post-operative instructions for Laparoscopic Cholecystectomy,  Diet: Resume home heart healthy diet (as discussed).   Activity: No heavy lifting >20 pounds (children, pets, laundry, garbage) or strenuous activity until follow-up, but light activity and walking are encouraged. Do not drive or drink alcohol if taking narcotic pain medications.  Wound care: 2 days after surgery (Friday, 1/4), may shower/get incision wet with soapy water and pat dry (do not rub incisions), but no baths or submerging incision underwater until follow-up.   Medications: Resume all home medications. For mild to moderate pain: acetaminophen (Tylenol) or ibuprofen/naproxen (if no kidney disease). Combining Tylenol with alcohol can substantially increase your risk of causing liver disease. Narcotic pain medications, if prescribed, can be used for severe pain, though may cause nausea, constipation, and drowsiness. Do not combine Tylenol and Percocet (or similar) within a 6 hour period as Percocet (and similar) contain(s) Tylenol. If you do not need the narcotic pain medication, you do not need to fill the prescription.  Call office [phone]) at any time if any questions, worsening pain, fevers/chills, bleeding, drainage from incision site, or other concerns.

## 2017-08-05 NOTE — Transfer of Care (Signed)
Immediate Anesthesia Transfer of Care Note  Patient: Kathryn Hawkins  Procedure(s) Performed: LAPAROSCOPIC CHOLECYSTECTOMY (N/A )  Patient Location: PACU  Anesthesia Type:General  Level of Consciousness: sedated  Airway & Oxygen Therapy: Patient Spontanous Breathing and Patient connected to face mask oxygen  Post-op Assessment: Report given to RN and Post -op Vital signs reviewed and stable  Post vital signs: Reviewed  Last Vitals:  Vitals:   08/05/17 1503 08/05/17 1504  BP: (P) 98/63 98/63  Pulse: (P) 90 90  Resp: (P) 12 12  Temp: (!) (P) 36.4 C   SpO2: (P) 100% 98%    Last Pain:  Vitals:   08/05/17 1220  TempSrc: Tympanic  PainSc: 5       Patients Stated Pain Goal: 3 (34/28/76 8115)  Complications: No apparent anesthesia complications

## 2017-08-05 NOTE — Anesthesia Preprocedure Evaluation (Addendum)
Anesthesia Evaluation  Patient identified by MRN, date of birth, ID band Patient awake    Reviewed: Allergy & Precautions, NPO status , Patient's Chart, lab work & pertinent test results, reviewed documented beta blocker date and time   Airway Mallampati: II  TM Distance: >3 FB     Dental  (+) Chipped, Missing   Pulmonary           Cardiovascular      Neuro/Psych    GI/Hepatic   Endo/Other    Renal/GU      Musculoskeletal   Abdominal   Peds  Hematology   Anesthesia Other Findings Neck movement ok. One missing tooth. Has a hx of TMJ. But can open mouth adequately.  Reproductive/Obstetrics                            Anesthesia Physical Anesthesia Plan  ASA: III  Anesthesia Plan: General   Post-op Pain Management:    Induction: Intravenous  PONV Risk Score and Plan:   Airway Management Planned: Oral ETT  Additional Equipment:   Intra-op Plan:   Post-operative Plan:   Informed Consent: I have reviewed the patients History and Physical, chart, labs and discussed the procedure including the risks, benefits and alternatives for the proposed anesthesia with the patient or authorized representative who has indicated his/her understanding and acceptance.     Plan Discussed with: CRNA  Anesthesia Plan Comments:         Anesthesia Quick Evaluation

## 2017-08-05 NOTE — Op Note (Signed)
SURGICAL OPERATIVE REPORT   DATE OF PROCEDURE: 08/05/2017  ATTENDING Surgeon(s): Vickie Epley, MD  ANESTHESIA: GETA  PRE-OPERATIVE DIAGNOSIS: Acute Cholecystitis (K80.00)  POST-OPERATIVE DIAGNOSIS: Acute Cholecystitis (K80.00)  PROCEDURE(S): (cpt's: 84132) 1.) Laparoscopic Cholecystectomy  INTRAOPERATIVE FINDINGS: Acute cholecystitis with moderately severe pericholecystic inflammation and pericholecystic edema surrounding the partially intrahepatic gallbladder  INTRAOPERATIVE FLUIDS: 1000 mL crystalloid   ESTIMATED BLOOD LOSS: Minimal (<30 mL)   URINE OUTPUT: No foley  SPECIMENS: Gallbladder  IMPLANTS: None  DRAINS: None   COMPLICATIONS: None apparent   CONDITION AT COMPLETION: Hemodynamically stable and extubated  DISPOSITION: PACU   INDICATION(S) FOR PROCEDURE:  Patient is a 62 y.o. female who this admission presented with unrelenting RUQ > epigastric abdominal pain. Ultrasound suggested acute calculous cholecystitis. All risks, benefits, and alternatives to above elective procedures were discussed with the patient, who elected to proceed, and informed consent was accordingly obtained at that time.   DETAILS OF PROCEDURE:  Patient was brought to the operating suite and appropriately identified. General anesthesia was administered along with peri-operative prophylactic IV antibiotics, and endotracheal intubation was performed by anesthesiologist, along with NG/OG tube for gastric decompression. In supine position, operative site was prepped and draped in usual sterile fashion, and following a brief time out, initial 5 mm incision was made in a natural skin crease just above the umbilicus. Fascia was then elevated, and a Verress needle was inserted and its proper position confirmed using aspiration and saline meniscus test.  Upon insufflation of the abdominal cavity with carbon dioxide to a well-tolerated pressure of 12-15 mmHg, 5 mm peri-umbilical port followed by  laparoscope were inserted and used to inspect the abdominal cavity and its contents with no injuries from insertion of the first trochar noted. Three additional trocars were inserted, one at the epigastric position (10 mm) and two along the Right costal margin (5 mm). The table was then placed in reverse Trendelenburg position with the Right side up. Filmy adhesions between the gallbladder and omentum/duodenum/transverse colon were lysed using combined blunt dissection and selective electrocautery. The apex/dome of the gallbladder was grasped with an atraumatic grasper passed through the lateral port and retracted apically over the liver. The infundibulum was also grasped and retracted, exposing Calot's triangle. The peritoneum overlying the gallbladder infundibulum was incised and dissected free of surrounding peritoneal attachments, revealing the cystic duct and cystic artery, which were clipped twice on the patient side and once on the gallbladder specimen side close to the gallbladder. The gallbladder was then dissected from its peritoneal attachments to the liver using electrocautery, and the gallbladder was placed into a laparoscopic specimen bag and removed from the abdominal cavity via the epigastric port site. Hemostasis and secure placement of clips were confirmed, and intra-peritoneal cavity was inspected with no additional findings. PMI laparoscopic fascial closure device was then used to re-approximate fascia at the 10 mm epigastric port site.  All ports were then removed under direct visualization, and abdominal cavity was desuflated. All port sites were irrigated/cleaned, additional local anesthetic was injected at each incision, 3-0 Vicryl was used to re-approximate dermis at 10 mm port site(s), and subcuticular 4-0 Monocryl suture was used to re-approximate skin. Skin was then cleaned, dried, and sterile skin glue was applied. Patient was then safely able to be awakened, extubated, and transferred  to PACU for post-operative monitoring and care.   I was present for all aspects of procedure, and there were no intra-operative complications apparent.

## 2017-08-06 ENCOUNTER — Encounter: Payer: Self-pay | Admitting: Surgery

## 2017-08-06 LAB — HIV ANTIBODY (ROUTINE TESTING W REFLEX): HIV SCREEN 4TH GENERATION: NONREACTIVE

## 2017-08-06 NOTE — Anesthesia Postprocedure Evaluation (Signed)
Anesthesia Post Note  Patient: [facility]  Procedure(s) Performed: LAPAROSCOPIC CHOLECYSTECTOMY (N/A )  Patient location during evaluation: PACU Anesthesia Type: General Level of consciousness: awake and alert Pain management: pain level controlled Vital Signs Assessment: post-procedure vital signs reviewed and stable Respiratory status: spontaneous breathing, nonlabored ventilation, respiratory function stable and patient connected to nasal cannula oxygen Cardiovascular status: blood pressure returned to baseline and stable Postop Assessment: no apparent nausea or vomiting Anesthetic complications: no     Last Vitals:  Vitals:   08/05/17 1631 08/05/17 1737  BP: 116/67 108/63  Pulse: 80 84  Resp: 16 14  Temp: 36.8 C 36.7 C  SpO2: 95% 97%    Last Pain:  Vitals:   08/05/17 1737  TempSrc: Oral  PainSc:                  Loriene Taunton S

## 2017-08-07 LAB — SURGICAL PATHOLOGY

## 2017-08-07 NOTE — Discharge Summary (Signed)
Physician Discharge Summary  Patient ID: [facility] MRN: [id number] DOB/AGE: Feb 08, 1956 62 y.o.  Admit date: 08/04/2017 Discharge date: 08/07/2017  Admission Diagnoses:  Discharge Diagnoses:  Active Problems:   Acute cholecystitis   Discharged Condition: good  Hospital Course: 62 y.o. female presented [facility] for abdominal pain. Workup was found to be significant for ultrasound imaging demonstrating acute calculous cholecystitis. Informed consent was obtained and documented, and patient underwent uneventful laparoscopic cholecystectomy [facility], 08/05/2017).  Post-operatively, patient's pain improved/resolved and advancement of patient's diet and ambulation were well-tolerated. The remainder of patient's hospital course was essentially unremarkable, and discharge planning was initiated accordingly with patient safely able to be discharged home with appropriate discharge instructions, pain control, and outpatient surgical follow-up after all of her and family's questions were answered to their expressed satisfaction.  Consults: None  Significant Diagnostic Studies: radiology: Ultrasound: acute cholecystitis  Treatments: IV hydration, antibiotics: ceftriaxone and surgery: laparoscopic cholecystectomy [facility], 08/05/2017)  Discharge Exam: Blood pressure 108/63, pulse 84, temperature 98.1 F (36.7 C), temperature source Oral, resp. rate 14, height 5\' 5"  (1.651 m), weight 150 lb (68 kg), SpO2 97 %. General appearance: alert, cooperative and no distress GI: abdomen soft and non-distended with mild epigastric peri-incisional tenderness to palpation, incisions well-approximated without any surrounding erythema or drainage  Disposition: 01-Home or Self Care   Allergies as of 08/05/2017      Reactions   Penicillins Other (See Comments)   Yeast Infection      Medication List    TAKE these medications   buPROPion 150 MG 12 hr tablet Commonly known as:  WELLBUTRIN SR Take 1 tablet (150 mg  total) by mouth daily. What changed:  additional instructions   buPROPion 75 MG tablet Commonly known as:  WELLBUTRIN TAKE ONE TABLET BY MOUTH TWICE DAILY What changed:    how much to take  how to take this  when to take this   calcium carbonate 500 MG chewable tablet Commonly known as:  TUMS - dosed in mg elemental calcium Chew 2-4 tablets by mouth as needed for indigestion or heartburn.   estradiol 0.5 MG tablet Commonly known as:  ESTRACE TAKE ONE TABLET BY MOUTH EVERY DAY What changed:    how much to take  how to take this  when to take this   ibuprofen 200 MG tablet Commonly known as:  ADVIL,MOTRIN Take 200-400 mg by mouth every 6 (six) hours as needed.   oxyCODONE-acetaminophen 5-325 MG tablet Commonly known as:  ROXICET Take 1-2 tablets by mouth every 4 (four) hours as needed for severe pain.   progesterone 200 MG capsule Commonly known as:  PROMETRIUM TAKE 1 CAPSULE BY MOUTH EVERY DAY What changed:    how much to take  how to take this  when to take this      [place]. Go on 08/19/2017.   Specialty:  General Surgery Why:  Go at 3:30pm with Dr. Dahlia Byes. Contact information: [address] [place] [phone]          Signed: Vickie Epley 08/07/2017, 7:55 AM

## 2017-08-12 ENCOUNTER — Telehealth: Payer: Self-pay

## 2017-08-12 NOTE — Telephone Encounter (Signed)
Patient called wanting to know if it would be okay for her to wait until her post-op visit to decide on returning to work. I advised her that would be perfectly fine and that if she needed a letter in writing to give [place] a call and we could get that for her. She verbalized understanding. I did ask patient how she was feeling and she stated that she has some pain every now and then through her top incision but feels fine otherwise. I advised her that this is normal and that it will take take for that area to heal. She verbalized understanding at this time.

## 2017-08-19 ENCOUNTER — Ambulatory Visit (INDEPENDENT_AMBULATORY_CARE_PROVIDER_SITE_OTHER): Payer: Managed Care, Other (non HMO) | Admitting: Surgery

## 2017-08-19 ENCOUNTER — Encounter: Payer: Self-pay | Admitting: Surgery

## 2017-08-19 VITALS — BP 112/76 | HR 100 | Temp 98.3°F | Ht 65.0 in | Wt 150.2 lb

## 2017-08-19 DIAGNOSIS — Z09 Encounter for follow-up examination after completed treatment for conditions other than malignant neoplasm: Secondary | ICD-10-CM

## 2017-08-19 NOTE — Progress Notes (Signed)
S/ p lap chole by Dr. Rosana Hoes 08/04/17 Doing well Pain subsided, taking PO, no fevers or chills Path d/w pt  PE NAD Abd: soft, incisions c/d/i, no infection  A/p Doing well RTC prn No heavy lifting May see nutritionist for diet modification

## 2017-08-19 NOTE — Patient Instructions (Addendum)
We can send a referral to a Nutrionist of your choice. Please let us know if you have a preference.    GENERAL POST-OPERATIVE PATIENT INSTRUCTIONS   WOUND CARE INSTRUCTIONS:  Keep a dry clean dressing on the wound if there is drainage. The initial bandage may be removed after 24 hours.  Once the wound has quit draining you may leave it open to air.  If clothing rubs against the wound or causes irritation and the wound is not draining you may cover it with a dry dressing during the daytime.  Try to keep the wound dry and avoid ointments on the wound unless directed to do so.  If the wound becomes bright red and painful or starts to drain infected material that is not clear, please contact your physician immediately.  If the wound is mildly pink and has a thick firm ridge underneath it, this is normal, and is referred to as a healing ridge.  This will resolve over the next 4-6 weeks.  BATHING: You may shower if you have been informed of this by your surgeon. However, Please do not submerge in a tub, hot tub, or pool until incisions are completely sealed or have been told by your surgeon that you may do so.  DIET:  You may eat any foods that you can tolerate.  It is a good idea to eat a high fiber diet and take in plenty of fluids to prevent constipation.  If you do become constipated you may want to take a mild laxative or take ducolax tablets on a daily basis until your bowel habits are regular.  Constipation can be very uncomfortable, along with straining, after recent surgery.  ACTIVITY:  You are encouraged to cough and deep breath or use your incentive spirometer if you were given one, every 15-30 minutes when awake.  This will help prevent respiratory complications and low grade fevers post-operatively if you had a general anesthetic.  You may want to hug a pillow when coughing and sneezing to add additional support to the surgical area, if you had abdominal or chest surgery, which will decrease pain  during these times.  You are encouraged to walk and engage in light activity for the next two weeks.  You should not lift more than 20 pounds, until 09/16/2017 as it could put you at increased risk for complications.  Twenty pounds is roughly equivalent to a plastic bag of groceries. At that time- Listen to your body when lifting, if you have pain when lifting, stop and then try again in a few days. Soreness after doing exercises or activities of daily living is normal as you get back in to your normal routine.  MEDICATIONS:  Try to take narcotic medications and anti-inflammatory medications, such as tylenol, ibuprofen, naprosyn, etc., with food.  This will minimize stomach upset from the medication.  Should you develop nausea and vomiting from the pain medication, or develop a rash, please discontinue the medication and contact your physician.  You should not drive, make important decisions, or operate machinery when taking narcotic pain medication.  SUNBLOCK Use sun block to incision area over the next year if this area will be exposed to sun. This helps decrease scarring and will allow you avoid a permanent darkened area over your incision.  QUESTIONS:  Please feel free to call our office if you have any questions, and we will be glad to assist you. [phone]

## 2017-09-15 ENCOUNTER — Ambulatory Visit: Payer: Self-pay | Admitting: Dietician

## 2017-09-15 NOTE — Progress Notes (Unsigned)
Nutrition: 09/15/2017  CC: Wants assistance with nutritional assistance that will help with eating and maintaining health after gall bladder surgery.  Also has questions regarding how she and her husband (that has been dx with prediabetes) can eat for staying healthy.  Assessment: Has decrease fat in foods and frying. Has started walking 30-40 minutes as many days as possible.  About 2 miles. Does yoga and lifting with Kathlee Nations.  B: Oatmeal with milk, walnuts, cinnamon and raisins.  OR egg with whole grain bread.  Using fat free milk. Break: yogurt and nuts or mixed nuts with raisins and dark chocolate chips. Lunch: usually leftovers such as the corn beef rubin casserole.  Cooked at home and using lean and low fat. Snack: similar to morning Dinner. Home cooked tonight a dish of lean pork chops with mushrooms, rice and mushrooms, along with mixed greens.   Snack: apple or slice of part-skim milk mozzarella cheese and whole grain cracker.  Hx diverticulitis and TMJ and eats no raw vegetables. Has had some constipation, but no diarrhea past surgery.  Stools are soft.   Will have 1 glass red wine some days.  Recommendations; Continue to bake, broil, grill and roast. Continue to do current exercise regimen. Monitor the fat in nuts and cheese and not over do at one time. Use non-starchy veggies steamed roasted or grilled. Use whole grain starches and protein in as many foods as possible. Aim for 3 gm of fiber per serving in bread or cereal.  Goals: Be healthy. Stay active Have no diarrhea episodes.  F/Y:  As desired.  Call [facility] for appointment.  Teaching Materials.: Food group handout Food label Pre-diabetes handout containing healthy eating recommendations  Maggie Yovani Cogburn, RN, RD, LDN

## 2017-10-19 ENCOUNTER — Other Ambulatory Visit: Payer: Self-pay | Admitting: Internal Medicine

## 2017-10-19 DIAGNOSIS — Z1231 Encounter for screening mammogram for malignant neoplasm of breast: Secondary | ICD-10-CM

## 2017-11-05 ENCOUNTER — Ambulatory Visit
Admission: RE | Admit: 2017-11-05 | Discharge: 2017-11-05 | Disposition: A | Discharge status: Home / Self Care | Payer: Managed Care, Other (non HMO) | Source: Ambulatory Visit | Attending: Internal Medicine | Admitting: Internal Medicine

## 2017-11-05 DIAGNOSIS — R928 Other abnormal and inconclusive findings on diagnostic imaging of breast: Secondary | ICD-10-CM | POA: Insufficient documentation

## 2017-11-05 DIAGNOSIS — Z1231 Encounter for screening mammogram for malignant neoplasm of breast: Secondary | ICD-10-CM | POA: Diagnosis present

## 2017-11-09 ENCOUNTER — Other Ambulatory Visit: Payer: Self-pay | Admitting: Internal Medicine

## 2017-11-09 DIAGNOSIS — R928 Other abnormal and inconclusive findings on diagnostic imaging of breast: Secondary | ICD-10-CM

## 2017-11-09 DIAGNOSIS — N6489 Other specified disorders of breast: Secondary | ICD-10-CM

## 2017-11-16 ENCOUNTER — Ambulatory Visit
Admission: RE | Admit: 2017-11-16 | Discharge: 2017-11-16 | Disposition: A | Discharge status: Home / Self Care | Payer: Managed Care, Other (non HMO) | Source: Ambulatory Visit | Attending: Internal Medicine | Admitting: Internal Medicine

## 2017-11-16 DIAGNOSIS — R928 Other abnormal and inconclusive findings on diagnostic imaging of breast: Secondary | ICD-10-CM

## 2017-11-16 DIAGNOSIS — N6489 Other specified disorders of breast: Secondary | ICD-10-CM | POA: Diagnosis present

## 2017-12-31 ENCOUNTER — Encounter: Payer: Self-pay | Admitting: Obstetrics and Gynecology

## 2017-12-31 ENCOUNTER — Other Ambulatory Visit: Payer: Self-pay | Admitting: Obstetrics and Gynecology

## 2017-12-31 ENCOUNTER — Ambulatory Visit (INDEPENDENT_AMBULATORY_CARE_PROVIDER_SITE_OTHER): Payer: BLUE CROSS/BLUE SHIELD | Admitting: Obstetrics and Gynecology

## 2017-12-31 VITALS — BP 101/76 | HR 115 | Ht 65.0 in | Wt 149.0 lb

## 2017-12-31 DIAGNOSIS — M25551 Pain in right hip: Secondary | ICD-10-CM | POA: Diagnosis not present

## 2017-12-31 DIAGNOSIS — Z01419 Encounter for gynecological examination (general) (routine) without abnormal findings: Secondary | ICD-10-CM

## 2017-12-31 NOTE — Progress Notes (Signed)
Subjective:   [facility] is a 62 y.o. G4P2 Caucasian female here for a routine well-woman exam.  No LMP recorded. Patient has had a hysterectomy.    Current complaints: reports worsening low back pain with new onset deep left hip pain, worse when laying down and sometimes painful during sex. Has h/o low back pain and bursitis of hip- sees Dr wells, chiropractor periodically. Feels like she is walking funny since hip pain started. PCP: me       doesn't desire labs  Social History: Sexual: heterosexual Marital Status: married Living situation: with spouse Occupation: Warehouse manager at College: no tobacco use Illicit drugs: no history of illicit drug use  The following portions of the patient's history were reviewed and updated as appropriate: allergies, current medications, past family history, past medical history, past social history, past surgical history and problem list.  Past Medical History Past Medical History:  Diagnosis Date  . Diverticulitis   . Dyspareunia in female   . Herniated disc, cervical   . Vaginal dryness   . Vulvar atrophy     Past Surgical History Past Surgical History:  Procedure Laterality Date  . ABDOMINAL HYSTERECTOMY  1994  . CHOLECYSTECTOMY N/A 08/05/2017   Procedure: LAPAROSCOPIC CHOLECYSTECTOMY;  Surgeon: Vickie Epley, MD;  Location: [facility] ORS;  Service: General;  Laterality: N/A;    Gynecologic History G2P2  No LMP recorded. Patient has had a hysterectomy. Contraception: status post hysterectomy Last Pap: ?Marland Kitchen Results were: normal Last mammogram: 11/2017. Results were: normal   Obstetric History OB History  Gravida Para Term Preterm AB Living  2 2          SAB TAB Ectopic Multiple Live Births               # Outcome Date GA Lbr Len/2nd Weight Sex Delivery Anes PTL Lv  2 Para 1982    M Vag-Spont     1 Para 1978    M Vag-Spont       Current Medications Current Outpatient Medications on File Prior to Visit  Medication  Sig Dispense Refill  . buPROPion (WELLBUTRIN SR) 150 MG 12 hr tablet Take 1 tablet (150 mg total) by mouth daily. (Patient taking differently: Take 150 mg by mouth daily. Alternate 150mg  and 75mg  daily) 90 tablet 3  . buPROPion (WELLBUTRIN) 75 MG tablet TAKE ONE TABLET BY MOUTH TWICE DAILY (Patient taking differently: Take 75MG  by mouth daily - alternate with 150mg  every other day) 180 tablet 2  . estradiol (ESTRACE) 0.5 MG tablet TAKE ONE TABLET BY MOUTH EVERY DAY (Patient taking differently: TAKE ONE TABLET BY MOUTH EVERY DAY for 21 days then take none for 7 days) 90 tablet 3  . ibuprofen (ADVIL,MOTRIN) 200 MG tablet Take 200-400 mg by mouth every 6 (six) hours as needed.    . progesterone (PROMETRIUM) 200 MG capsule TAKE 1 CAPSULE BY MOUTH EVERY DAY (Patient taking differently: TAKE 1 CAPSULE BY MOUTH EVERY DAY for 21 days then none for 7 days) 90 capsule 2  . oxyCODONE-acetaminophen (ROXICET) 5-325 MG tablet Take 1-2 tablets by mouth every 4 (four) hours as needed for severe pain. (Patient not taking: Reported on 12/31/2017) 20 tablet 0   No current facility-administered medications on file prior to visit.     Review of Systems Patient denies any headaches, blurred vision, shortness of breath, chest pain, abdominal pain, problems with bowel movements, urination, or intercourse.  Objective:  BP 101/76   Pulse (!) 115  Ht 5\' 5"  (1.651 m)   Wt 149 lb (67.6 kg)   BMI 24.79 kg/m  Physical Exam  General:  Well developed, well nourished, no acute distress. She is alert and oriented x3. Skin:  Warm and dry Neck:  Midline trachea, no thyromegaly or nodules Cardiovascular: Regular rate and rhythm, no murmur heard Lungs:  Effort normal, all lung fields clear to auscultation bilaterally Breasts:  No dominant palpable mass, retraction, or nipple discharge Abdomen:  Soft, non tender, no hepatosplenomegaly or masses Pelvic:  External genitalia is normal in appearance.  The vagina is normal in  appearance. The cervix is bulbous, no CMT.  Thin prep pap is done with HR HPV cotesting. Uterus is surgically absent.  No adnexal masses or tenderness noted. Extremities:  No swelling or varicosities noted Psych:  She has a normal mood and affect  Assessment:   Healthy well-woman exam S/p total hysterectomy Left hip pain Low back pain.  Plan:  BDS ordered Referred to Dr Hulan Saas for hip pain. F/U 1 year for AE, or sooner mammogram already done this year Colonoscopy done this year. BDS ordered(last one 2014)  [place], CNM

## 2018-01-01 LAB — CYTOLOGY - PAP

## 2018-01-26 NOTE — Progress Notes (Signed)
[facility] [address] [place], [place] [address] Phone: [phone] Subjective:    I'm seeing this patient by the request  of:  Shambley, Melody N, CNM   CC: right hip pain   XTG:GYIRSWNIOE  [name] is a 62 y.o. female coming in with complaint of right hip pain. States she has pain in the lower back on the left side. The right sided hip pain is different from the left sided pain. Nerve pain in the thigh. States that the hip flexors are very tight. States that her gait has changed due to the tightness. External rotation of the right hip is limited. Left sided numbness and tingling in the left foot. States that she does restorative yoga that is painful. States that the right side feels like it is going to give out on her. Has had injections in the hip for bursitis about 10-15 years ago. Also states her tailbone is painful. States it feels like she has fallen and bruised her tailbone really bad.   Onset- 6 months  Location- Right hip, left lower back Character- Sharp, achy, nerve pain Aggravating factors- Laying on her sides, sitting, standing, or lying down too long Therapies tried- Massage, Advil   Severity-8 out of 10  Patient did have an MRI from 2017 of the lumbar spine.  Independently visualized by me showing mild spinal stenosis at L3-L4 central disc protrusion at L4-L5 but no significant bony impingement noted.    Past Medical History:  Diagnosis Date  . Diverticulitis   . Dyspareunia in female   . Herniated disc, cervical   . Vaginal dryness   . Vulvar atrophy    Past Surgical History:  Procedure Laterality Date  . ABDOMINAL HYSTERECTOMY  1994  . CHOLECYSTECTOMY N/A 08/05/2017   Procedure: LAPAROSCOPIC CHOLECYSTECTOMY;  Surgeon: Vickie Epley, MD;  Location: [facility] ORS;  Service: General;  Laterality: N/A;   Social History   Socioeconomic History  . Marital status: Married    Spouse name: Not on file  . Number of children: Not  on file  . Years of education: Not on file  . Highest education level: Not on file  Occupational History  . Not on file  Social Needs  . Financial resource strain: Not on file  . Food insecurity:    Worry: Not on file    Inability: Not on file  . Transportation needs:    Medical: Not on file    Non-medical: Not on file  Tobacco Use  . Smoking status: Never Smoker  . Smokeless tobacco: Never Used  Substance and Sexual Activity  . Alcohol use: Yes    Comment: occasional  . Drug use: No  . Sexual activity: Yes  Lifestyle  . Physical activity:    Days per week: Not on file    Minutes per session: Not on file  . Stress: Not on file  Relationships  . Social connections:    Talks on phone: Not on file    Gets together: Not on file    Attends religious service: Not on file    Active member of club or organization: Not on file    Attends meetings of clubs or organizations: Not on file    Relationship status: Not on file  Other Topics Concern  . Not on file  Social History Narrative  . Not on file   Allergies  Allergen Reactions  . Penicillins Other (See Comments)    Yeast Infection  Family History  Problem Relation Age of Onset  . Breast cancer Paternal Grandmother 71     Past medical history, social, surgical and family history all reviewed in electronic medical record.  No pertanent information unless stated regarding to the chief complaint.   Review of Systems:Review of systems updated and as accurate as of 01/26/18  No headache, visual changes, nausea, vomiting, diarrhea, constipation, dizziness, abdominal pain, skin rash, fevers, chills, night sweats, weight loss, swollen lymph nodes, body aches, joint swelling, muscle aches, chest pain, shortness of breath, mood changes.   Objective  There were no vitals taken for this visit. Systems examined below as of 01/26/18   General: No apparent distress alert and oriented x3 mood and affect normal, dressed  appropriately.  HEENT: Pupils equal, extraocular movements intact  Respiratory: Patient's speak in full sentences and does not appear short of breath  Cardiovascular: No lower extremity edema, non tender, no erythema  Skin: Warm dry intact with no signs of infection or rash on extremities or on axial skeleton.  Abdomen: Soft nontender  Neuro: Cranial nerves II through XII are intact, neurovascularly intact in all extremities with 2+ DTRs and 2+ pulses.  Lymph: No lymphadenopathy of posterior or anterior cervical chain or axillae bilaterally.  Gait normal with good balance and coordination.  MSK:  Non tender with full range of motion and good stability and symmetric strength and tone of shoulders, elbows, wrist,  knee and ankles bilaterally.  Hip: Right ROM IR: 5 Deg with increasing pain ER: 45 Deg, Flexion: 120 Deg, Extension: 100 Deg, Abduction: 35 Deg, Adduction: 15 Deg Strength 45 in all planes compared to the contralateral side Pelvic alignment unremarkable to inspection and palpation. Standing hip rotation and gait without trendelenburg sign / unsteadiness. Mild tenderness over the greater trochanteric area and positive Faber test for more pain over the include medial area. Pain with internal rotation and mild pain over the sacroiliac joint negative straight leg test.  [id number]; 15 additional minutes spent for Therapeutic exercises as stated in above notes.  This included exercises focusing on stretching, strengthening, with significant focus on eccentric aspects.   Long term goals include an improvement in range of motion, strength, endurance as well as avoiding reinjury. Patient's frequency would include in 1-2 times a day, 3-5 times a week for a duration of 6-12 weeks. Hip strengthening exercises which included:  Pelvic tilt/bracing to help with proper recruitment of the lower abs and pelvic floor muscles  Glute strengthening to properly contract glutes without over-engaging low back and  hamstrings - prone hip extension and glute bridge exercises Proper stretching techniques to increase effectiveness for the hip flexors, groin, quads, piriformic and low back when appropriate    Proper technique shown and discussed handout in great detail with ATC.  All questions were discussed and answered.     Impression and Recommendations:     This case required medical decision making of moderate complexity.      Note: This dictation was prepared with Dragon dictation along with smaller phrase technology. Any transcriptional errors that result from this process are unintentional.

## 2018-01-27 ENCOUNTER — Ambulatory Visit: Payer: BLUE CROSS/BLUE SHIELD | Admitting: Family Medicine

## 2018-01-27 ENCOUNTER — Ambulatory Visit (INDEPENDENT_AMBULATORY_CARE_PROVIDER_SITE_OTHER)
Admission: RE | Admit: 2018-01-27 | Discharge: 2018-01-27 | Disposition: A | Discharge status: Home / Self Care | Payer: BLUE CROSS/BLUE SHIELD | Source: Ambulatory Visit | Attending: Family Medicine | Admitting: Family Medicine

## 2018-01-27 ENCOUNTER — Encounter: Payer: Self-pay | Admitting: Family Medicine

## 2018-01-27 VITALS — BP 100/78 | HR 112 | Ht 65.0 in | Wt 152.0 lb

## 2018-01-27 DIAGNOSIS — M25551 Pain in right hip: Secondary | ICD-10-CM

## 2018-01-27 DIAGNOSIS — M7601 Gluteal tendinitis, right hip: Secondary | ICD-10-CM

## 2018-01-27 DIAGNOSIS — M1611 Unilateral primary osteoarthritis, right hip: Secondary | ICD-10-CM | POA: Diagnosis not present

## 2018-01-27 DIAGNOSIS — M48062 Spinal stenosis, lumbar region with neurogenic claudication: Secondary | ICD-10-CM | POA: Diagnosis not present

## 2018-01-27 DIAGNOSIS — M48061 Spinal stenosis, lumbar region without neurogenic claudication: Secondary | ICD-10-CM | POA: Insufficient documentation

## 2018-01-27 DIAGNOSIS — M25552 Pain in left hip: Secondary | ICD-10-CM

## 2018-01-27 MED ORDER — GABAPENTIN 100 MG PO CAPS: 200.0000 mg | ORAL_CAPSULE | Freq: Every day | ORAL | 3 refills | Status: DC

## 2018-01-27 NOTE — Patient Instructions (Signed)
Good to see you  Ice 20 minutes 2 times daily. Usually after activity and before bed. Exercises 3 times a week.  Gabapentin 200mg  nightly could help  Over the counter get  Vitamin D 2000 IU daily  Turmeric 500mg  daily  Tart cherry extract any dose at night OK to do what you want for now.  Xrays downstairs See me again in 4-5 weeks  .

## 2018-01-27 NOTE — Assessment & Plan Note (Signed)
Patient does have more of a right hip pain.  Patient does have some underlying spinal stenosis that is likely contributing.  Patient has been taking a significant number of medications.  Possible lumbar radiculopathy and started gabapentin.  Home exercise for more the hip.  X-rays of the right hip for any underlying arthritis or acetabular impingement that could be contributing.  Patient wants to remain active.  Mild antalgic gait on noticed.  Discussed low impact exercise and at the moment.  Discussed over-the-counter medications as well.  Follow-up again in 4 weeks

## 2018-02-01 ENCOUNTER — Telehealth: Payer: Self-pay | Admitting: Family Medicine

## 2018-02-01 NOTE — Telephone Encounter (Signed)
Copied from [place] [id number]. Topic: General - Other >> Feb 01, 2018  2:31 PM Mcneil, Ja-Kwan wrote: Reason for CRM: Pt called for x-ray results. Pt requests a call back. Cb# [phone]

## 2018-02-01 NOTE — Telephone Encounter (Signed)
Pt given xray results and expressed understanding, will talk further with Tamala Julian at her next appt.

## 2018-03-01 NOTE — Progress Notes (Signed)
[facility] [address] [place], [place] [address] Phone: [phone] Subjective:      CC: Bilateral hip pain.   JOA:[id number]  Kathryn Hawkins is a 62 y.o. female coming in with complaint of bilateral hip pain. She continues to have pain over the greater trochanter on the right side. Pain in the groin on the right side. Is not having tingling and numbness going down left leg into the foot. Patient has symptom when performing exercises. Also has pain in that leg after exercises. Is having a hard time sleeping due to pain.  Patient has been trying the exercises intermittently but has not been making significant improvement.  Patient states that she has been noncompliant in the to the way she feels.  Adamant again that he does not want any significant intervention.   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  Past Medical History:  Diagnosis Date  . Diverticulitis   . Dyspareunia in female   . Herniated disc, cervical   . Vaginal dryness   . Vulvar atrophy    Past Surgical History:  Procedure Laterality Date  . ABDOMINAL HYSTERECTOMY  1994  . CHOLECYSTECTOMY N/A 08/05/2017   Procedure: LAPAROSCOPIC CHOLECYSTECTOMY;  Surgeon: Vickie Epley, MD;  Location: [facility] ORS;  Service: General;  Laterality: N/A;   Social History   Socioeconomic History  . Marital status: Married    Spouse name: Not on file  . Number of children: Not on file  . Years of education: Not on file  . Highest education level: Not on file  Occupational History  . Not on file  Social Needs  . Financial resource strain: Not on file  . Food insecurity:    Worry: Not on file    Inability: Not on file  . Transportation needs:    Medical: Not on file    Non-medical: Not on file  Tobacco Use  . Smoking status: Never Smoker  . Smokeless tobacco: Never Used  Substance and Sexual Activity  . Alcohol  use: Yes    Comment: occasional  . Drug use: No  . Sexual activity: Yes  Lifestyle  . Physical activity:    Days per week: Not on file    Minutes per session: Not on file  . Stress: Not on file  Relationships  . Social connections:    Talks on phone: Not on file    Gets together: Not on file    Attends religious service: Not on file    Active member of club or organization: Not on file    Attends meetings of clubs or organizations: Not on file    Relationship status: Not on file  Other Topics Concern  . Not on file  Social History Narrative  . Not on file   Allergies  Allergen Reactions  . Penicillins Other (See Comments)    Yeast Infection   Family History  Problem Relation Age of Onset  . Breast cancer Paternal Grandmother 17     Past medical history, social, surgical and family history all reviewed in electronic medical record.  No pertanent information unless stated regarding to the chief complaint.   Review of Systems:Review of systems updated and as  No headache, visual changes, nausea, vomiting, diarrhea, constipation, dizziness, abdominal pain, skin rash, fevers, chills, night sweats, weight loss, swollen lymph nodes, body aches, joint swelling, chest pain, shortness of breath, mood changes.  Positive muscle aches  Objective  Blood pressure 118/74, pulse 96, height 5\' 5"  (1.651 m), weight 156 lb (70.8 kg), SpO2 97 %.   General: No apparent distress alert and oriented x3 mood and affect normal, dressed appropriately.  HEENT: Pupils equal, extraocular movements intact  Respiratory: Patient's speak in full sentences and does not appear short of breath  Cardiovascular: No lower extremity edema, non tender, no erythema  Skin: Warm dry intact with no signs of infection or rash on extremities or on axial skeleton.  Abdomen: Soft nontender  Neuro: Cranial nerves II through XII are intact, neurovascularly intact in all extremities with 2+ DTRs and 2+ pulses.  Lymph: No  lymphadenopathy of posterior or anterior cervical chain or axillae bilaterally.  Gait antalgic MSK:  Non tender with full range of motion and good stability and symmetric strength and tone of shoulders, elbows, wrist,  knee and ankles bilaterally.  Mild arthritic changes of multiple joints  Right hip shows 4 out of 5 strength in all planes.  Patient has limited internal range of motion more than previous exam with only 10 degrees.  Patient does have some mild tightness with Corky Sox on the right and left.  Back exam shows loss of lordosis.  Questionable positive straight leg test of the right neurovascularly intact distally.   Impression and Recommendations:     This case required medical decision making of moderate complexity.      Note: This dictation was prepared with Dragon dictation along with smaller phrase technology. Any transcriptional errors that result from this process are unintentional.

## 2018-03-03 ENCOUNTER — Encounter: Payer: Self-pay | Admitting: Family Medicine

## 2018-03-03 ENCOUNTER — Other Ambulatory Visit (INDEPENDENT_AMBULATORY_CARE_PROVIDER_SITE_OTHER): Payer: BLUE CROSS/BLUE SHIELD

## 2018-03-03 ENCOUNTER — Ambulatory Visit: Payer: BLUE CROSS/BLUE SHIELD | Admitting: Family Medicine

## 2018-03-03 VITALS — BP 118/74 | HR 96 | Ht 65.0 in | Wt 156.0 lb

## 2018-03-03 DIAGNOSIS — M2550 Pain in unspecified joint: Secondary | ICD-10-CM

## 2018-03-03 DIAGNOSIS — M25551 Pain in right hip: Secondary | ICD-10-CM

## 2018-03-03 LAB — CBC WITH DIFFERENTIAL/PLATELET
BASOS PCT: 0.8 % (ref 0.0–3.0)
Basophils Absolute: 0.1 10*3/uL (ref 0.0–0.1)
Eosinophils Absolute: 0.1 10*3/uL (ref 0.0–0.7)
Eosinophils Relative: 1.2 % (ref 0.0–5.0)
HEMATOCRIT: 37.8 % (ref 36.0–46.0)
HEMOGLOBIN: 12.8 g/dL (ref 12.0–15.0)
Lymphocytes Relative: 12 % (ref 12.0–46.0)
Lymphs Abs: 1.3 10*3/uL (ref 0.7–4.0)
MCHC: 34 g/dL (ref 30.0–36.0)
MCV: 94.8 fl (ref 78.0–100.0)
MONOS PCT: 6.7 % (ref 3.0–12.0)
Monocytes Absolute: 0.7 10*3/uL (ref 0.1–1.0)
Neutro Abs: 8.3 10*3/uL — ABNORMAL HIGH (ref 1.4–7.7)
Neutrophils Relative %: 79.3 % — ABNORMAL HIGH (ref 43.0–77.0)
Platelets: 337 10*3/uL (ref 150.0–400.0)
RBC: 3.98 Mil/uL (ref 3.87–5.11)
RDW: 12.6 % (ref 11.5–15.5)
WBC: 10.5 10*3/uL (ref 4.0–10.5)

## 2018-03-03 LAB — TSH: TSH: 2.41 u[IU]/mL (ref 0.35–4.50)

## 2018-03-03 LAB — VITAMIN D 25 HYDROXY (VIT D DEFICIENCY, FRACTURES): VITD: 44.57 ng/mL (ref 30.00–100.00)

## 2018-03-03 LAB — URIC ACID: URIC ACID, SERUM: 4.5 mg/dL (ref 2.4–7.0)

## 2018-03-03 LAB — C-REACTIVE PROTEIN: CRP: 0.2 mg/dL — ABNORMAL LOW (ref 0.5–20.0)

## 2018-03-03 LAB — IBC PANEL
Iron: 74 ug/dL (ref 42–145)
Saturation Ratios: 20.1 % (ref 20.0–50.0)
Transferrin: 263 mg/dL (ref 212.0–360.0)

## 2018-03-03 LAB — SEDIMENTATION RATE: Sed Rate: 13 mm/hr (ref 0–30)

## 2018-03-03 NOTE — Patient Instructions (Signed)
Good to see you  We will refer you to PT as you suggested Most of this is the right hip and the arthritis is worsening.  We will get you in with nutrition  Fish oil only 2 grams daily  Bromelain then do 3 times daily with meals.  Magnesium if doing it would be 400mg  at night Choline 500mg  daily for cognitive.  Lets see how you do with PT and see me again then in 6-8 weeks

## 2018-03-04 ENCOUNTER — Encounter: Payer: Self-pay | Admitting: Family Medicine

## 2018-03-04 NOTE — Assessment & Plan Note (Signed)
No known arthritic changes.  Patient has been somewhat noncompliant.  Not doing the exercises.  Patient came in with a list of more than 30 questions which we were trying to answer in detail.  Because of patient's pain discussed that because it seems to be out of proportion will get laboratory work-up.  Patient is encouraged to do physical therapy but she would like to use her own physical therapist and will get [place] the name so we can refer accordingly.  Patient is adamant she feels nutrition is also playing a role and will refer her to her nutritionist of choice.  We discussed over-the-counter medications that I think will be potentially beneficial.  Patient gave me a list of multiple supplements we discussed in great length as well.  Patient will follow-up again in 6 to 8 weeks.  Patient did make a comment that the dose of the office visit that we did not take it at the time when I pointed out that I had been in the room for greater than 45 minutes.  Spent  45 minutes with patient face-to-face and had greater than 50% of counseling including as described above in assessment and plan.

## 2018-03-05 LAB — PTH, INTACT AND CALCIUM
Calcium: 9.3 mg/dL (ref 8.6–10.4)
PTH: 37 pg/mL (ref 14–64)

## 2018-03-05 LAB — CYCLIC CITRUL PEPTIDE ANTIBODY, IGG: Cyclic Citrullin Peptide Ab: <16 UNITS

## 2018-03-05 LAB — ANA: Anti Nuclear Antibody(ANA): NEGATIVE

## 2018-03-05 LAB — ANGIOTENSIN CONVERTING ENZYME: Angiotensin-Converting Enzyme: 21 U/L (ref 9–67)

## 2018-03-05 LAB — CALCIUM, IONIZED: Calcium, Ion: 5.09 mg/dL (ref 4.8–5.6)

## 2018-03-05 LAB — RHEUMATOID FACTOR

## 2018-03-10 ENCOUNTER — Telehealth: Payer: Self-pay | Admitting: Obstetrics and Gynecology

## 2018-03-10 NOTE — Telephone Encounter (Signed)
The patient called and stated that she needs to specifically speak with Melody in regards to her experience at the office she was recently referred to.  Please advise.

## 2018-03-10 NOTE — Telephone Encounter (Signed)
pls advise

## 2018-03-11 DIAGNOSIS — M25551 Pain in right hip: Secondary | ICD-10-CM | POA: Diagnosis not present

## 2018-03-11 DIAGNOSIS — M24151 Other articular cartilage disorders, right hip: Secondary | ICD-10-CM | POA: Diagnosis not present

## 2018-03-11 DIAGNOSIS — R262 Difficulty in walking, not elsewhere classified: Secondary | ICD-10-CM | POA: Diagnosis not present

## 2018-03-18 DIAGNOSIS — R262 Difficulty in walking, not elsewhere classified: Secondary | ICD-10-CM | POA: Diagnosis not present

## 2018-03-18 DIAGNOSIS — M24151 Other articular cartilage disorders, right hip: Secondary | ICD-10-CM | POA: Diagnosis not present

## 2018-03-18 DIAGNOSIS — M25551 Pain in right hip: Secondary | ICD-10-CM | POA: Diagnosis not present

## 2018-03-25 DIAGNOSIS — M545 Low back pain: Secondary | ICD-10-CM | POA: Diagnosis not present

## 2018-03-25 DIAGNOSIS — M4807 Spinal stenosis, lumbosacral region: Secondary | ICD-10-CM | POA: Diagnosis not present

## 2018-03-25 DIAGNOSIS — M24151 Other articular cartilage disorders, right hip: Secondary | ICD-10-CM | POA: Diagnosis not present

## 2018-03-25 DIAGNOSIS — M25551 Pain in right hip: Secondary | ICD-10-CM | POA: Diagnosis not present

## 2018-03-30 DIAGNOSIS — R262 Difficulty in walking, not elsewhere classified: Secondary | ICD-10-CM | POA: Diagnosis not present

## 2018-03-30 DIAGNOSIS — M25551 Pain in right hip: Secondary | ICD-10-CM | POA: Diagnosis not present

## 2018-03-30 DIAGNOSIS — M24151 Other articular cartilage disorders, right hip: Secondary | ICD-10-CM | POA: Diagnosis not present

## 2018-04-08 DIAGNOSIS — M25551 Pain in right hip: Secondary | ICD-10-CM | POA: Diagnosis not present

## 2018-04-08 DIAGNOSIS — M545 Low back pain: Secondary | ICD-10-CM | POA: Diagnosis not present

## 2018-04-08 DIAGNOSIS — M24151 Other articular cartilage disorders, right hip: Secondary | ICD-10-CM | POA: Diagnosis not present

## 2018-04-08 DIAGNOSIS — M4807 Spinal stenosis, lumbosacral region: Secondary | ICD-10-CM | POA: Diagnosis not present

## 2018-04-15 DIAGNOSIS — M24151 Other articular cartilage disorders, right hip: Secondary | ICD-10-CM | POA: Diagnosis not present

## 2018-04-15 DIAGNOSIS — M4807 Spinal stenosis, lumbosacral region: Secondary | ICD-10-CM | POA: Diagnosis not present

## 2018-04-15 DIAGNOSIS — M25551 Pain in right hip: Secondary | ICD-10-CM | POA: Diagnosis not present

## 2018-04-15 DIAGNOSIS — M545 Low back pain: Secondary | ICD-10-CM | POA: Diagnosis not present

## 2018-04-27 ENCOUNTER — Other Ambulatory Visit: Payer: Self-pay | Admitting: Obstetrics and Gynecology

## 2018-06-05 ENCOUNTER — Other Ambulatory Visit: Payer: Self-pay | Admitting: Obstetrics and Gynecology

## 2018-07-16 DIAGNOSIS — M25551 Pain in right hip: Secondary | ICD-10-CM | POA: Diagnosis not present

## 2018-07-16 DIAGNOSIS — M1611 Unilateral primary osteoarthritis, right hip: Secondary | ICD-10-CM | POA: Diagnosis not present

## 2018-07-16 DIAGNOSIS — M25552 Pain in left hip: Secondary | ICD-10-CM | POA: Diagnosis not present

## 2018-09-01 DIAGNOSIS — Z85820 Personal history of malignant melanoma of skin: Secondary | ICD-10-CM | POA: Diagnosis not present

## 2018-09-01 DIAGNOSIS — L821 Other seborrheic keratosis: Secondary | ICD-10-CM | POA: Diagnosis not present

## 2018-09-01 DIAGNOSIS — L812 Freckles: Secondary | ICD-10-CM | POA: Diagnosis not present

## 2018-09-01 DIAGNOSIS — D485 Neoplasm of uncertain behavior of skin: Secondary | ICD-10-CM | POA: Diagnosis not present

## 2018-10-04 DIAGNOSIS — Z0000 Encounter for general adult medical examination without abnormal findings: Secondary | ICD-10-CM | POA: Diagnosis not present

## 2018-10-04 DIAGNOSIS — Z1389 Encounter for screening for other disorder: Secondary | ICD-10-CM | POA: Diagnosis not present

## 2018-10-04 DIAGNOSIS — J4540 Moderate persistent asthma, uncomplicated: Secondary | ICD-10-CM | POA: Diagnosis not present

## 2018-10-11 DIAGNOSIS — Z0000 Encounter for general adult medical examination without abnormal findings: Secondary | ICD-10-CM | POA: Diagnosis not present

## 2018-10-11 DIAGNOSIS — M5414 Radiculopathy, thoracic region: Secondary | ICD-10-CM | POA: Diagnosis not present

## 2018-10-20 ENCOUNTER — Other Ambulatory Visit: Payer: Self-pay | Admitting: Obstetrics and Gynecology

## 2018-10-20 DIAGNOSIS — Z1231 Encounter for screening mammogram for malignant neoplasm of breast: Secondary | ICD-10-CM

## 2019-01-04 ENCOUNTER — Encounter: Payer: BLUE CROSS/BLUE SHIELD | Admitting: Obstetrics and Gynecology

## 2019-01-05 ENCOUNTER — Telehealth: Payer: Self-pay | Admitting: *Deleted

## 2019-01-05 NOTE — Telephone Encounter (Signed)
Coronavirus (COVID-19) Are you at risk?  Are you at risk for the Coronavirus (COVID-19)?  To be considered HIGH RISK for Coronavirus (COVID-19), you have to meet the following criteria:  . Traveled to [place], [place], [place], [place] or [place]; or in the [place] to [place], [place], [place], or [place]; and have fever, cough, and shortness of breath within the last 2 weeks of travel OR . Been in close contact with a person diagnosed with COVID-19 within the last 2 weeks and have fever, cough, and shortness of breath . IF YOU DO NOT MEET THESE CRITERIA, YOU ARE CONSIDERED LOW RISK FOR COVID-19.  What to do if you are HIGH RISK for COVID-19?  . If you are having a medical emergency, call 911. . Seek medical care right away. Before you go to a doctor's office, urgent care or emergency department, call ahead and tell them about your recent travel, contact with someone diagnosed with COVID-19, and your symptoms. You should receive instructions from your physician's office regarding next steps of care.  . When you arrive at healthcare provider, tell the healthcare staff immediately you have returned from visiting [place], [place], [place], [place] or [place]; or traveled in the [place] to [place], [place], [place], or [place]; in the last two weeks or you have been in close contact with a person diagnosed with COVID-19 in the last 2 weeks.   . Tell the health care staff about your symptoms: fever, cough and shortness of breath. . After you have been seen by a medical provider, you will be either: o Tested for (COVID-19) and discharged home on quarantine except to seek medical care if symptoms worsen, and asked to  - Stay home and avoid contact with others until you get your results (4-5 days)  - Avoid travel on public transportation if possible (such as bus, train, or airplane) or o Sent to the Emergency Department by EMS for evaluation, COVID-19 testing, and possible  admission depending on your condition and test results.  What to do if you are LOW RISK for COVID-19?  Reduce your risk of any infection by using the same precautions used for avoiding the common cold or flu:  . Wash your hands often with soap and warm water for at least 20 seconds.  If soap and water are not readily available, use an alcohol-based hand sanitizer with at least 60% alcohol.  . If coughing or sneezing, cover your mouth and nose by coughing or sneezing into the elbow areas of your shirt or coat, into a tissue or into your sleeve (not your hands). . Avoid shaking hands with others and consider head nods or verbal greetings only. . Avoid touching your eyes, nose, or mouth with unwashed hands.  . Avoid close contact with people who are sick. . Avoid places or events with large numbers of people in one location, like concerts or sporting events. . Carefully consider travel plans you have or are making. . If you are planning any travel outside or inside the [place], visit the CDC's Travelers' Health webpage for the latest health notices. . If you have some symptoms but not all symptoms, continue to monitor at home and seek medical attention if your symptoms worsen. . If you are having a medical emergency, call 911.   ADDITIONAL HEALTHCARE OPTIONS FOR PATIENTS  [facility] / e-Visit: [email or web address]         [facility]: [phone]  [facility]: [phone]                   [facility]: [phone]   Spoke with pt denies any sx.  Amy Clontz, CMA 

## 2019-01-06 ENCOUNTER — Other Ambulatory Visit: Payer: Self-pay

## 2019-01-06 ENCOUNTER — Encounter: Payer: Self-pay | Admitting: Obstetrics and Gynecology

## 2019-01-06 ENCOUNTER — Telehealth: Payer: Self-pay | Admitting: Obstetrics and Gynecology

## 2019-01-06 ENCOUNTER — Other Ambulatory Visit: Payer: Self-pay | Admitting: Obstetrics and Gynecology

## 2019-01-06 ENCOUNTER — Ambulatory Visit (INDEPENDENT_AMBULATORY_CARE_PROVIDER_SITE_OTHER): Payer: BC Managed Care – PPO | Admitting: Obstetrics and Gynecology

## 2019-01-06 VITALS — BP 84/58 | HR 99 | Ht 65.5 in | Wt 135.2 lb

## 2019-01-06 DIAGNOSIS — Z01419 Encounter for gynecological examination (general) (routine) without abnormal findings: Secondary | ICD-10-CM | POA: Diagnosis not present

## 2019-01-06 MED ORDER — ESTRADIOL 0.1 MG/GM VA CREA: TOPICAL_CREAM | VAGINAL | 12 refills | Status: DC

## 2019-01-06 NOTE — Progress Notes (Signed)
Subjective:   Kathryn Hawkins is a 63 y.o. G95P2 Caucasian female here for a routine well-woman exam.  No LMP recorded. Patient has had a hysterectomy.    Current complaints: none PCP: me       doesn't desire labs  Social History: Sexual: heterosexual Marital Status: married Living situation: with spouse Occupation: Warehouse manager at [employer]: no tobacco use Illicit drugs: no history of illicit drug use  The following portions of the patient's history were reviewed and updated as appropriate: allergies, current medications, past family history, past medical history, past social history, past surgical history and problem list.  Past Medical History Past Medical History:  Diagnosis Date  . Diverticulitis   . Dyspareunia in female   . Herniated disc, cervical   . Vaginal dryness   . Vulvar atrophy     Past Surgical History Past Surgical History:  Procedure Laterality Date  . ABDOMINAL HYSTERECTOMY  1994  . CHOLECYSTECTOMY N/A 08/05/2017   Procedure: LAPAROSCOPIC CHOLECYSTECTOMY;  Surgeon: Vickie Epley, MD;  Location: [facility] ORS;  Service: General;  Laterality: N/A;    Gynecologic History G2P2  No LMP recorded. Patient has had a hysterectomy. Contraception: post menopausal status Last Pap: 2019. Results were: normal Last mammogram: 11/2017. Results were: normal   Obstetric History OB History  Gravida Para Term Preterm AB Living  2 2          SAB TAB Ectopic Multiple Live Births               # Outcome Date GA Lbr Len/2nd Weight Sex Delivery Anes PTL Lv  2 Para 1982    M Vag-Spont     1 Para 1978    M Vag-Spont       Current Medications Current Outpatient Medications on File Prior to Visit  Medication Sig Dispense Refill  . buPROPion (WELLBUTRIN SR) 150 MG 12 hr tablet Take 1 tablet (150 mg total) by mouth daily. (Patient taking differently: Take 150 mg by mouth daily. Alternate 150mg  and 75mg  daily) 90 tablet 3  . buPROPion (WELLBUTRIN) 75 MG tablet TAKE  ONE TABLET BY MOUTH TWICE DAILY (Patient taking differently: Take 75MG  by mouth daily - alternate with 150mg  every other day) 180 tablet 2  . cyclobenzaprine (FLEXERIL) 5 MG tablet Take 5 mg by mouth 3 (three) times daily as needed for muscle spasms.    Marland Kitchen estradiol (ESTRACE) 0.5 MG tablet TAKE ONE TABLET BY MOUTH EVERY DAY for 21 days then take none for 7 days 90 tablet 3  . ibuprofen (ADVIL,MOTRIN) 200 MG tablet Take 200-400 mg by mouth every 6 (six) hours as needed.    . meloxicam (MOBIC) 7.5 MG tablet Take 7.5 mg by mouth daily.    . progesterone (PROMETRIUM) 200 MG capsule TAKE 1 CAPSULE EVERY DAY 90 capsule 2   No current facility-administered medications on file prior to visit.     Review of Systems Patient denies any headaches, blurred vision, shortness of breath, chest pain, abdominal pain, problems with bowel movements, urination, or intercourse.  Objective:  BP (!) 84/58   Pulse 99   Ht 5' 5.5" (1.664 m)   Wt 135 lb 3.2 oz (61.3 kg)   BMI 22.16 kg/m  Physical Exam  General:  Well developed, well nourished, no acute distress. She is alert and oriented x3. Skin:  Warm and dry Neck:  Midline trachea, no thyromegaly or nodules Cardiovascular: Regular rate and rhythm, no murmur heard Lungs:  Effort normal, all lung fields  clear to auscultation bilaterally Breasts:  No dominant palpable mass, retraction, or nipple discharge Abdomen:  Soft, non tender, no hepatosplenomegaly or masses Pelvic:  External genitalia is normal in appearance.  The vagina is normal in appearance. The cervix is bulbous, no CMT.  Thin prep pap is not done. Uterus is felt to be normal size, shape, and contour.  No adnexal masses or tenderness noted. Extremities:  No swelling or varicosities noted Psych:  She has a normal mood and affect  Assessment:   Healthy well-woman exam  Plan:   F/U 1 year for AE, or sooner if needed Mammogram ordered  Nino Amano Rockney Ghee, CNM

## 2019-01-06 NOTE — Telephone Encounter (Signed)
The patient called and stated that she needs a list of all of her medications including the one that was to be sent in today, to qualify for medicare. The patient is requesting a call back if possible today. Please advise.

## 2019-01-07 NOTE — Telephone Encounter (Signed)
Called pt.

## 2019-01-10 ENCOUNTER — Telehealth: Payer: Self-pay

## 2019-01-10 ENCOUNTER — Other Ambulatory Visit: Payer: Self-pay

## 2019-01-10 ENCOUNTER — Telehealth: Payer: Self-pay | Admitting: Obstetrics and Gynecology

## 2019-01-10 MED ORDER — ESTRADIOL 0.1 MG/GM VA CREA: TOPICAL_CREAM | VAGINAL | 12 refills | Status: DC

## 2019-01-10 MED ORDER — ESTRADIOL 0.1 MG/GM VA CREA: 1.0000 | TOPICAL_CREAM | Freq: Every day | VAGINAL | 12 refills | Status: AC

## 2019-01-10 NOTE — Telephone Encounter (Signed)
Script sent and confirmation received. Pt informed through mychart.

## 2019-01-10 NOTE — Telephone Encounter (Signed)
[facility] called stating [facility] does not have her script. In her chart it shows it was called in on 6/4. Could you get that sent please.Thanks

## 2019-03-04 ENCOUNTER — Ambulatory Visit
Admission: RE | Admit: 2019-03-04 | Discharge: 2019-03-04 | Disposition: A | Discharge status: Home / Self Care | Payer: PPO | Source: Ambulatory Visit | Attending: Obstetrics and Gynecology | Admitting: Obstetrics and Gynecology

## 2019-03-04 DIAGNOSIS — Z1231 Encounter for screening mammogram for malignant neoplasm of breast: Secondary | ICD-10-CM | POA: Diagnosis not present

## 2019-03-07 ENCOUNTER — Other Ambulatory Visit: Payer: Self-pay | Admitting: Obstetrics and Gynecology

## 2019-03-07 DIAGNOSIS — N6489 Other specified disorders of breast: Secondary | ICD-10-CM

## 2019-03-07 DIAGNOSIS — R921 Mammographic calcification found on diagnostic imaging of breast: Secondary | ICD-10-CM

## 2019-03-07 DIAGNOSIS — R928 Other abnormal and inconclusive findings on diagnostic imaging of breast: Secondary | ICD-10-CM

## 2019-03-18 ENCOUNTER — Ambulatory Visit
Admission: RE | Admit: 2019-03-18 | Discharge: 2019-03-18 | Disposition: A | Discharge status: Home / Self Care | Payer: PPO | Source: Ambulatory Visit | Attending: Obstetrics and Gynecology | Admitting: Obstetrics and Gynecology

## 2019-03-18 ENCOUNTER — Other Ambulatory Visit: Payer: Self-pay

## 2019-03-18 DIAGNOSIS — N6311 Unspecified lump in the right breast, upper outer quadrant: Secondary | ICD-10-CM | POA: Diagnosis not present

## 2019-03-18 DIAGNOSIS — N6489 Other specified disorders of breast: Secondary | ICD-10-CM

## 2019-03-18 DIAGNOSIS — R921 Mammographic calcification found on diagnostic imaging of breast: Secondary | ICD-10-CM | POA: Diagnosis not present

## 2019-03-18 DIAGNOSIS — R928 Other abnormal and inconclusive findings on diagnostic imaging of breast: Secondary | ICD-10-CM

## 2019-03-21 ENCOUNTER — Other Ambulatory Visit: Payer: Self-pay | Admitting: Obstetrics and Gynecology

## 2019-03-21 DIAGNOSIS — N6310 Unspecified lump in the right breast, unspecified quadrant: Secondary | ICD-10-CM

## 2019-03-21 DIAGNOSIS — R928 Other abnormal and inconclusive findings on diagnostic imaging of breast: Secondary | ICD-10-CM

## 2019-03-24 ENCOUNTER — Other Ambulatory Visit: Payer: Self-pay | Admitting: Obstetrics and Gynecology

## 2019-03-24 ENCOUNTER — Ambulatory Visit
Admission: RE | Admit: 2019-03-24 | Discharge: 2019-03-24 | Disposition: A | Discharge status: Home / Self Care | Payer: PPO | Source: Ambulatory Visit | Attending: Obstetrics and Gynecology | Admitting: Obstetrics and Gynecology

## 2019-03-24 ENCOUNTER — Other Ambulatory Visit: Payer: Self-pay

## 2019-03-24 DIAGNOSIS — N6310 Unspecified lump in the right breast, unspecified quadrant: Secondary | ICD-10-CM | POA: Insufficient documentation

## 2019-03-24 DIAGNOSIS — J95811 Postprocedural pneumothorax: Secondary | ICD-10-CM | POA: Insufficient documentation

## 2019-03-24 DIAGNOSIS — R0602 Shortness of breath: Secondary | ICD-10-CM | POA: Diagnosis not present

## 2019-03-24 DIAGNOSIS — R928 Other abnormal and inconclusive findings on diagnostic imaging of breast: Secondary | ICD-10-CM | POA: Diagnosis not present

## 2019-03-24 DIAGNOSIS — N6011 Diffuse cystic mastopathy of right breast: Secondary | ICD-10-CM | POA: Diagnosis not present

## 2019-03-24 DIAGNOSIS — N6311 Unspecified lump in the right breast, upper outer quadrant: Secondary | ICD-10-CM | POA: Diagnosis not present

## 2019-03-24 HISTORY — PX: BREAST BIOPSY: SHX20

## 2019-03-25 LAB — SURGICAL PATHOLOGY

## 2019-03-29 ENCOUNTER — Telehealth: Payer: Self-pay | Admitting: Obstetrics and Gynecology

## 2019-03-29 NOTE — Telephone Encounter (Signed)
pls advise

## 2019-03-29 NOTE — Telephone Encounter (Signed)
The patient called and stated that she needs to speak with Amy in regards to asking if she should stop taking her medication Estrogen and progesterone due to her developing breast issues. Please advise.

## 2019-03-31 NOTE — Telephone Encounter (Signed)
The patient called and stated that she has not heard anything back from the message that was sent back earlier this week, The pt was informed provider has family emergency. Pt voiced understanding and mentioned a prescription refill as well. Please advise.

## 2019-04-04 ENCOUNTER — Other Ambulatory Visit: Payer: Self-pay | Admitting: Obstetrics and Gynecology

## 2019-04-04 DIAGNOSIS — N6310 Unspecified lump in the right breast, unspecified quadrant: Secondary | ICD-10-CM

## 2019-04-05 NOTE — Telephone Encounter (Signed)
pls advise

## 2019-04-06 ENCOUNTER — Telehealth: Payer: Self-pay | Admitting: Obstetrics and Gynecology

## 2019-04-06 NOTE — Telephone Encounter (Signed)
Pt called wanted to know if any of her hormone replacement could cause breast changes?? pls advise

## 2019-04-06 NOTE — Telephone Encounter (Signed)
The patient called and stated she would like a call back from Amy if possible today in regards to some medication concerns and results that will coincide with the decision of stopping the  Use of the medication. Please advise.

## 2019-04-06 NOTE — Telephone Encounter (Signed)
Can we set up a televisit so I can discuss this with her?

## 2019-04-13 ENCOUNTER — Ambulatory Visit (INDEPENDENT_AMBULATORY_CARE_PROVIDER_SITE_OTHER): Payer: PPO | Admitting: Obstetrics and Gynecology

## 2019-04-13 ENCOUNTER — Other Ambulatory Visit: Payer: Self-pay

## 2019-04-13 ENCOUNTER — Encounter: Payer: Self-pay | Admitting: Obstetrics and Gynecology

## 2019-04-13 VITALS — BP 128/82 | HR 88 | Ht 65.0 in | Wt 135.0 lb

## 2019-04-13 DIAGNOSIS — N6099 Unspecified benign mammary dysplasia of unspecified breast: Secondary | ICD-10-CM | POA: Diagnosis not present

## 2019-04-13 NOTE — Telephone Encounter (Signed)
televisit done

## 2019-04-13 NOTE — Progress Notes (Signed)
Virtual Visit via Telephone Note  I connected with Kathryn Hawkins on 04/13/19 at  8:00 AM EDT by telephone and verified that I am speaking with the correct person using two identifiers.  Location: Patient: work Provider: office   I discussed the limitations, risks, security and privacy concerns of performing an evaluation and management service by telephone and the availability of in person appointments. I also discussed with the patient that there may be a patient responsible charge related to this service. The patient expressed understanding and agreed to proceed.   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 NEGATIVE FOR ATYPIA AND MALIGNANCY.  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 NEGATIVE FOR ATYPIA AND MALIGNANCY.  HRT management  Reviewed current research findings in relationship to HRT( specifically the type she is on) and no shown increased risks for breast cancer development in the future.  Did discuss reducing dose for overall less exposure, but she is happy with current dose and desires waiting to see how follow up films in 6 months look.   Follow Up Instructions: Desires to continue with current estradiol and prometrium dose at this time. Will let me know if she desires reduction in dose or cessation in near future.    I discussed the assessment and treatment plan with the patient. The patient was provided an opportunity to ask questions and all were answered. The patient agreed with the plan and demonstrated an understanding of the instructions.   The patient was advised to call back or  seek an in-person evaluation if the symptoms worsen or if the condition fails to improve as anticipated.  I provided 14 minutes of non-face-to-face time during this encounter.   Chace Bisch Rockney Ghee, CNM

## 2019-04-28 ENCOUNTER — Other Ambulatory Visit: Payer: Self-pay | Admitting: Obstetrics and Gynecology

## 2019-04-28 DIAGNOSIS — N6310 Unspecified lump in the right breast, unspecified quadrant: Secondary | ICD-10-CM

## 2019-05-11 ENCOUNTER — Ambulatory Visit: Payer: Self-pay

## 2019-05-14 ENCOUNTER — Other Ambulatory Visit: Payer: Self-pay | Admitting: Obstetrics and Gynecology

## 2019-05-18 ENCOUNTER — Ambulatory Visit: Payer: Self-pay

## 2019-05-18 DIAGNOSIS — Z23 Encounter for immunization: Secondary | ICD-10-CM

## 2019-06-10 ENCOUNTER — Other Ambulatory Visit: Payer: Self-pay | Admitting: Obstetrics and Gynecology

## 2019-06-13 ENCOUNTER — Other Ambulatory Visit: Payer: Self-pay

## 2019-06-13 ENCOUNTER — Telehealth: Payer: Self-pay

## 2019-06-13 MED ORDER — ESTRADIOL 0.5 MG PO TABS: ORAL_TABLET | ORAL | 3 refills | Status: AC

## 2019-06-13 NOTE — Telephone Encounter (Signed)
Refill sent.

## 2019-06-13 NOTE — Telephone Encounter (Signed)
ESTRIDOL pt pharmacy states they sent the request to provider and have not heard back. Pt does not have a dose for tomorrow. Please refill for pt. Pharmacy of choice confirmed with pt.

## 2019-06-14 DIAGNOSIS — M5116 Intervertebral disc disorders with radiculopathy, lumbar region: Secondary | ICD-10-CM | POA: Diagnosis not present

## 2019-06-14 DIAGNOSIS — H6121 Impacted cerumen, right ear: Secondary | ICD-10-CM | POA: Diagnosis not present

## 2019-09-14 DIAGNOSIS — L821 Other seborrheic keratosis: Secondary | ICD-10-CM | POA: Diagnosis not present

## 2019-09-14 DIAGNOSIS — D225 Melanocytic nevi of trunk: Secondary | ICD-10-CM | POA: Diagnosis not present

## 2019-09-14 DIAGNOSIS — D229 Melanocytic nevi, unspecified: Secondary | ICD-10-CM | POA: Diagnosis not present

## 2019-09-14 DIAGNOSIS — L820 Inflamed seborrheic keratosis: Secondary | ICD-10-CM | POA: Diagnosis not present

## 2019-09-14 DIAGNOSIS — L578 Other skin changes due to chronic exposure to nonionizing radiation: Secondary | ICD-10-CM | POA: Diagnosis not present

## 2019-09-14 DIAGNOSIS — Z1283 Encounter for screening for malignant neoplasm of skin: Secondary | ICD-10-CM | POA: Diagnosis not present

## 2019-09-14 DIAGNOSIS — D1801 Hemangioma of skin and subcutaneous tissue: Secondary | ICD-10-CM | POA: Diagnosis not present

## 2019-09-14 DIAGNOSIS — D2230 Melanocytic nevi of unspecified part of face: Secondary | ICD-10-CM | POA: Diagnosis not present

## 2019-09-14 DIAGNOSIS — Z85820 Personal history of malignant melanoma of skin: Secondary | ICD-10-CM | POA: Diagnosis not present

## 2019-09-14 DIAGNOSIS — B353 Tinea pedis: Secondary | ICD-10-CM | POA: Diagnosis not present

## 2019-09-14 DIAGNOSIS — L720 Epidermal cyst: Secondary | ICD-10-CM | POA: Diagnosis not present

## 2019-09-26 ENCOUNTER — Ambulatory Visit
Admission: RE | Admit: 2019-09-26 | Discharge: 2019-09-26 | Disposition: A | Discharge status: Home / Self Care | Payer: PPO | Source: Ambulatory Visit | Attending: Obstetrics and Gynecology | Admitting: Obstetrics and Gynecology

## 2019-09-26 DIAGNOSIS — N6310 Unspecified lump in the right breast, unspecified quadrant: Secondary | ICD-10-CM | POA: Diagnosis present

## 2019-09-26 DIAGNOSIS — N6311 Unspecified lump in the right breast, upper outer quadrant: Secondary | ICD-10-CM | POA: Diagnosis not present

## 2019-09-26 DIAGNOSIS — R928 Other abnormal and inconclusive findings on diagnostic imaging of breast: Secondary | ICD-10-CM | POA: Diagnosis not present

## 2019-10-06 DIAGNOSIS — Z79899 Other long term (current) drug therapy: Secondary | ICD-10-CM | POA: Diagnosis not present

## 2019-10-06 DIAGNOSIS — Z0000 Encounter for general adult medical examination without abnormal findings: Secondary | ICD-10-CM | POA: Diagnosis not present

## 2019-10-13 DIAGNOSIS — Z0000 Encounter for general adult medical examination without abnormal findings: Secondary | ICD-10-CM | POA: Diagnosis not present

## 2019-10-13 DIAGNOSIS — Z79899 Other long term (current) drug therapy: Secondary | ICD-10-CM | POA: Diagnosis not present

## 2019-10-13 DIAGNOSIS — E538 Deficiency of other specified B group vitamins: Secondary | ICD-10-CM | POA: Diagnosis not present

## 2019-10-26 ENCOUNTER — Other Ambulatory Visit: Payer: Self-pay | Admitting: Internal Medicine

## 2019-10-26 DIAGNOSIS — R928 Other abnormal and inconclusive findings on diagnostic imaging of breast: Secondary | ICD-10-CM

## 2019-11-02 ENCOUNTER — Other Ambulatory Visit: Payer: Self-pay

## 2019-11-02 ENCOUNTER — Ambulatory Visit: Payer: PPO | Admitting: Dermatology

## 2019-11-02 DIAGNOSIS — B353 Tinea pedis: Secondary | ICD-10-CM | POA: Diagnosis not present

## 2019-11-02 DIAGNOSIS — L720 Epidermal cyst: Secondary | ICD-10-CM | POA: Diagnosis not present

## 2019-11-02 NOTE — Progress Notes (Signed)
   Follow-Up Visit   Subjective  Kathryn Hawkins is a 64 y.o. female who presents for the following: Follow-up (Tinea pedis ).  She is improved but discontinued the Lamisil pills after 1 month.  She tried to take again 2 weeks after giving a break but had some additional GI symptoms again.  She also complains of a spot on the back of the neck for which she would like evaluation.  It seems to be getting larger.  The following portions of the chart were reviewed this encounter and updated as appropriate:     Review of Systems: No other skin or systemic complaints.  Objective  Well appearing patient in no apparent distress; mood and affect are within normal limits.  A focused examination was performed including bilateral feet, neck. Relevant physical exam findings are noted in the Assessment and Plan.  Objective  Posterior base of neck: 0.6 cm cystic papule  Objective  Right Middle Plantar Surface: Some peeling   Assessment & Plan  Epidermal inclusion cyst Posterior base of neck  Discussed excision due to symptoms and recent growth. She will check with insurance to see if procedure will be covered and decide if she wants to schedule.  Codes given to patient today: [id number]  Tinea pedis of right foot Right Middle Plantar Surface  D/C Terbinafine due to GI side effects. Continue Ketoconazole 2% cream.  Return in about 10 months (around 09/03/2020) for TBSE.   I, Ashok Cordia, CMA, am acting as scribe for Sarina Ser, MD .

## 2019-11-21 DIAGNOSIS — M5116 Intervertebral disc disorders with radiculopathy, lumbar region: Secondary | ICD-10-CM | POA: Diagnosis not present

## 2020-01-31 ENCOUNTER — Ambulatory Visit: Payer: PPO | Admitting: Dermatology

## 2020-01-31 ENCOUNTER — Other Ambulatory Visit: Payer: Self-pay

## 2020-01-31 DIAGNOSIS — L659 Nonscarring hair loss, unspecified: Secondary | ICD-10-CM | POA: Diagnosis not present

## 2020-01-31 DIAGNOSIS — L821 Other seborrheic keratosis: Secondary | ICD-10-CM | POA: Diagnosis not present

## 2020-01-31 DIAGNOSIS — L720 Epidermal cyst: Secondary | ICD-10-CM

## 2020-01-31 MED ORDER — MUPIROCIN 2 % EX OINT: 1.0000 "application " | TOPICAL_OINTMENT | Freq: Every day | CUTANEOUS | 0 refills | Status: DC

## 2020-01-31 NOTE — Patient Instructions (Addendum)
Wound Care Instructions  1. Cleanse wound gently with soap and water once a day then pat dry with clean gauze. Apply a thing coat of Petrolatum (petroleum jelly, "Vaseline") over the wound (unless you have an allergy to this). We recommend that you use a new, sterile tube of Vaseline. Do not pick or remove scabs. Do not remove the yellow or white "healing tissue" from the base of the wound.  2. Cover the wound with fresh, clean, nonstick gauze and secure with paper tape. You may use Band-Aids in place of gauze and tape if the would is small enough, but would recommend trimming much of the tape off as there is often too much. Sometimes Band-Aids can irritate the skin.  3. You should call the office for your biopsy report after 1 week if you have not already been contacted.  4. If you experience any problems, such as abnormal amounts of bleeding, swelling, significant bruising, significant pain, or evidence of infection, please call the office immediately.  5. FOR ADULT SURGERY PATIENTS: If you need something for pain relief you may take 1 extra strength Tylenol (acetaminophen) AND 2 Ibuprofen (200mg each) together every 4 hours as needed for pain. (do not take these if you are allergic to them or if you have a reason you should not take them.) Typically, you may only need pain medication for 1 to 3 days.      Seborrheic Keratosis  What causes seborrheic keratoses? Seborrheic keratoses are harmless, common skin growths that first appear during adult life.  As time goes by, more growths appear.  Some people may develop a large number of them.  Seborrheic keratoses appear on both covered and uncovered body parts.  They are not caused by sunlight.  The tendency to develop seborrheic keratoses can be inherited.  They vary in color from skin-colored to gray, brown, or even black.  They can be either smooth or have a rough, warty surface.   Seborrheic keratoses are superficial and look as if they were  stuck on the skin.  Under the microscope this type of keratosis looks like layers upon layers of skin.  That is why at times the top layer may seem to fall off, but the rest of the growth remains and re-grows.    Treatment Seborrheic keratoses do not need to be treated, but can easily be removed in the office.  Seborrheic keratoses often cause symptoms when they rub on clothing or jewelry.  Lesions can be in the way of shaving.  If they become inflamed, they can cause itching, soreness, or burning.  Removal of a seborrheic keratosis can be accomplished by freezing, burning, or surgery. If any spot bleeds, scabs, or grows rapidly, please return to have it checked, as these can be an indication of a skin cancer.   

## 2020-01-31 NOTE — Progress Notes (Signed)
Follow-Up Visit   Subjective  Kathryn Hawkins is a 64 y.o. female who presents for the following: Cyst (post base of neck, pt presents for excision), check spot (R breast, just noticed, no symptoms), and hair thinning (scalp, 62yrs, no recent illness, stress, pt taking biotin).  The following portions of the chart were reviewed this encounter and updated as appropriate:  Tobacco  Allergies  Meds  Problems  Med Hx  Surg Hx  Fam Hx      Review of Systems:  No other skin or systemic complaints except as noted in HPI or Assessment and Plan.  Objective  Well appearing patient in no apparent distress; mood and affect are within normal limits.  A focused examination was performed including posterior base of neck. Relevant physical exam findings are noted in the Assessment and Plan.  Objective  posterior base of neck: Cystic pap 1.0cm  Objective  Right Breast: Stuck-on, waxy, tan-brown papule or plaque --Discussed benign etiology and prognosis.   Objective  Scalp: Hair thinning   Assessment & Plan    Epidermal cyst posterior base of neck  Skin excision - posterior base of neck  Lesion length (cm):  1 Lesion width (cm):  1 Margin per side (cm):  0.1 Total excision diameter (cm):  1.2 Informed consent: discussed and consent obtained   Timeout: patient name, date of birth, surgical site, and procedure verified   Procedure prep:  Patient was prepped and draped in usual sterile fashion Prep type:  Isopropyl alcohol and povidone-iodine Anesthesia: the lesion was anesthetized in a standard fashion   Anesthetic:  1% lidocaine w/ epinephrine 1-100,000 buffered w/ 8.4% NaHCO3 (4cc) Instrument used comment:  15c blade Hemostasis achieved with: pressure   Hemostasis achieved with comment:  Electrocautery Outcome: patient tolerated procedure well with no complications   Post-procedure details: sterile dressing applied and wound care instructions given   Dressing type: bandage  and pressure dressing (Mupirocin)    Skin repair - posterior base of neck Complexity:  Complex Final length (cm):  1.2 Reason for type of repair: reduce tension to allow closure, reduce the risk of dehiscence, infection, and necrosis, reduce subcutaneous dead space and avoid a hematoma, allow closure of the large defect, preserve normal anatomy, preserve normal anatomical and functional relationships and enhance both functionality and cosmetic results   Undermining: area extensively undermined   Undermining comment:  Undermining Defect 1.2 cm Subcutaneous layers (deep stitches):  Suture size:  4-0 Suture type: Vicryl (polyglactin 910)   Subcutaneous suture technique: Inverted Dermal. Fine/surface layer approximation (top stitches):  Suture size:  4-0 Suture type: nylon   Stitches: simple running   Stitches comment:  Nylon Suture removal (days):  7 Hemostasis achieved with: suture and pressure Outcome: patient tolerated procedure well with no complications   Post-procedure details: sterile dressing applied and wound care instructions given   Dressing type: bandage and pressure dressing (Mupirocin)    mupirocin ointment (BACTROBAN) 2 % - posterior base of neck  Specimen 1 - Surgical pathology Differential Diagnosis: D48.5 Cyst vs other Check Margins: No Cystic pap 1.0cm  Seborrheic keratosis Right Breast  Benign, observe  Alopecia Scalp  Androgenetic Alopecia vs other  Pt has had recent labs (TSH) and per pt WNL  Discussed may be r/t hormone changes, pandemic  Return in about 1 week (around 02/07/2020) for suture removal.   I, Sonya Hupman, RMA, am acting as scribe for Sarina Ser, MD .  Documentation: I have reviewed the above documentation for  accuracy and completeness, and I agree with the above.  Sarina Ser, MD

## 2020-02-02 ENCOUNTER — Telehealth: Payer: Self-pay

## 2020-02-02 NOTE — Telephone Encounter (Signed)
Lft pt msg to call if any problems after Tuesday's surgery.Kathryn Hawkins

## 2020-02-07 ENCOUNTER — Ambulatory Visit: Payer: PPO

## 2020-02-07 ENCOUNTER — Other Ambulatory Visit: Payer: Self-pay

## 2020-02-07 DIAGNOSIS — L720 Epidermal cyst: Secondary | ICD-10-CM

## 2020-02-07 NOTE — Progress Notes (Signed)
   Follow-Up Visit   Subjective  Kathryn Hawkins is a 64 y.o. female who presents for the following: Cyst (patient is here today for suture removal).  The following portions of the chart were reviewed this encounter and updated as appropriate:     Review of Systems:  No other skin or systemic complaints except as noted in HPI or Assessment and Plan.  Objective  Well appearing patient in no apparent distress; mood and affect are within normal limits.  A focused examination was performed including the post neck. Relevant physical exam findings are noted in the Assessment and Plan.  Objective  Neck - Posterior: Healing excision site  Assessment & Plan  Epidermal inclusion cyst Neck - Posterior  Encounter for Removal of Sutures - Incision site at the post neck is clean, dry and intact - Wound cleansed, sutures removed, wound cleansed and steri strips applied.  - Discussed pathology results showing benign cyst - Patient advised to keep steri-strips dry until they fall off. - Scars remodel for a full year. - Once steri-strips fall off, patient can apply over-the-counter silicone scar cream each night to help with scar remodeling if desired. - Patient advised to call with any concerns or if they notice any new or changing lesions.   Return for appointment as scheduled.

## 2020-02-13 ENCOUNTER — Encounter: Payer: Self-pay | Admitting: Dermatology

## 2020-03-06 ENCOUNTER — Other Ambulatory Visit: Payer: PPO

## 2020-03-13 ENCOUNTER — Other Ambulatory Visit: Payer: Self-pay

## 2020-03-13 ENCOUNTER — Ambulatory Visit
Admission: RE | Admit: 2020-03-13 | Discharge: 2020-03-13 | Disposition: A | Discharge status: Home / Self Care | Payer: PPO | Source: Ambulatory Visit | Attending: Internal Medicine | Admitting: Internal Medicine

## 2020-03-13 DIAGNOSIS — R928 Other abnormal and inconclusive findings on diagnostic imaging of breast: Secondary | ICD-10-CM

## 2020-03-13 DIAGNOSIS — N6311 Unspecified lump in the right breast, upper outer quadrant: Secondary | ICD-10-CM | POA: Diagnosis not present

## 2020-03-16 IMAGING — DX DG HIP (WITH OR WITHOUT PELVIS) 2-3V*R*
2 series · 2 of 2 positions shown · non-contrast
Comparison: None.

CLINICAL DATA: Pt c/o lateral and posterior right hip pain for 6
months. Pain radiates down the leg. Pt states the hip joint locks up
and she has groin tightness.

EXAM:
DG HIP (WITH OR WITHOUT PELVIS) 2-3V RIGHT

[hip ap]
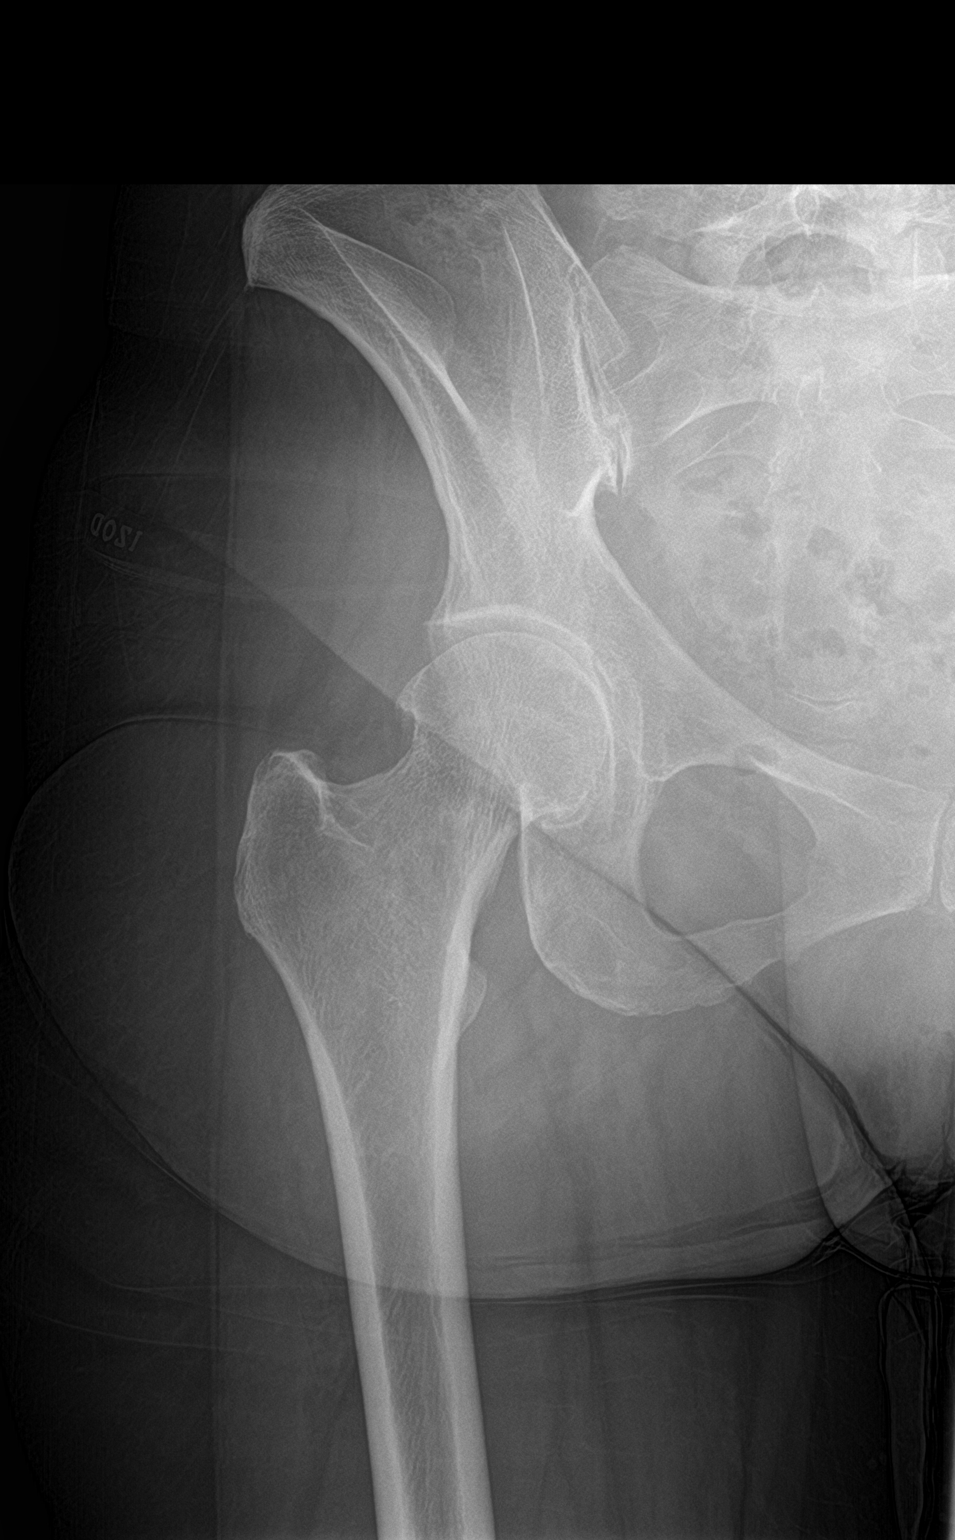

[hip lat]
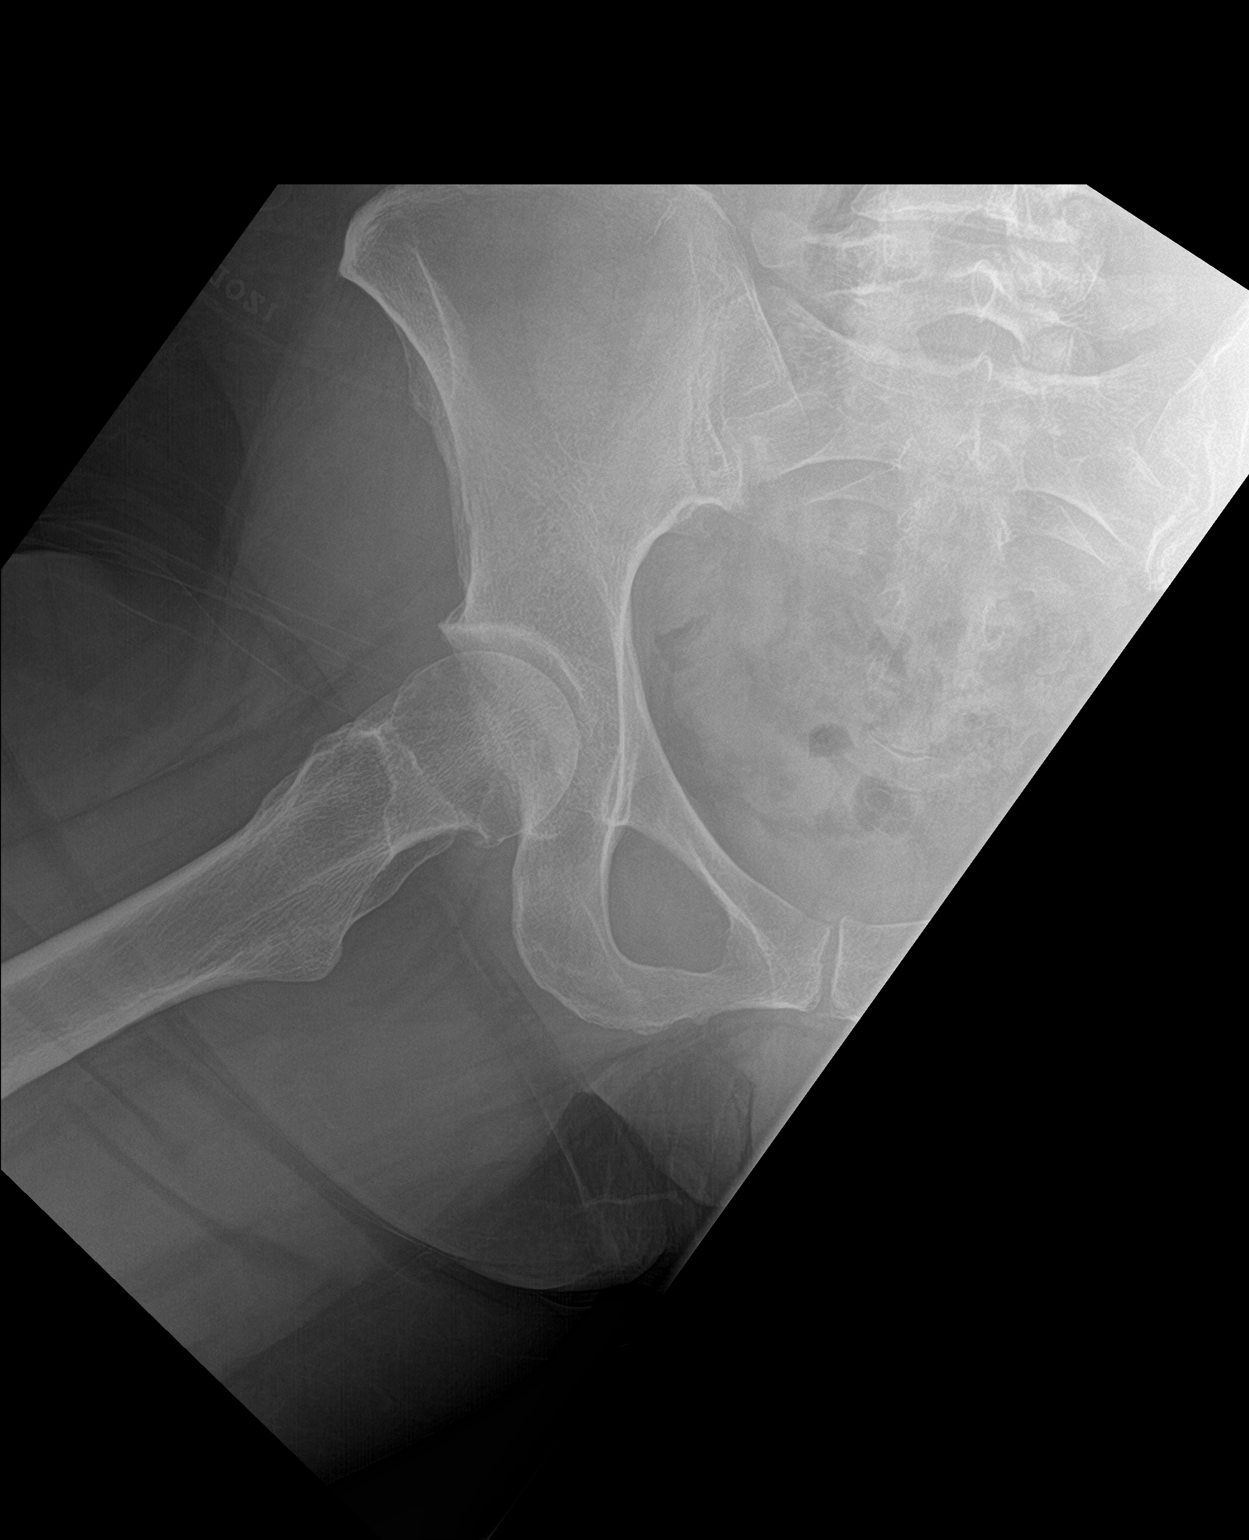

[2 of 2 positions shown; findings below may reference images not displayed]

FINDINGS: There is no evidence of hip fracture or dislocation. Small marginal
spurs from the femoral head and superior acetabulum. Normal
mineralization and alignment.
IMPRESSION: 1. No fracture or dislocation.
2. Early right hip degenerative spurring.

## 2020-04-23 ENCOUNTER — Other Ambulatory Visit: Payer: Self-pay

## 2020-04-23 ENCOUNTER — Ambulatory Visit: Payer: PPO | Admitting: Dermatology

## 2020-04-23 DIAGNOSIS — L578 Other skin changes due to chronic exposure to nonionizing radiation: Secondary | ICD-10-CM

## 2020-04-23 DIAGNOSIS — L821 Other seborrheic keratosis: Secondary | ICD-10-CM | POA: Diagnosis not present

## 2020-04-23 DIAGNOSIS — L820 Inflamed seborrheic keratosis: Secondary | ICD-10-CM

## 2020-04-23 NOTE — Patient Instructions (Signed)
Cryotherapy Aftercare  . Wash gently with soap and water everyday.   . Apply Vaseline and Band-Aid daily until healed.  

## 2020-04-23 NOTE — Progress Notes (Signed)
   Follow-Up Visit   Subjective  Kathryn Hawkins is a 64 y.o. female who presents for the following: Lesions (Spot at left side that has been there for months but is now irritated and itching. ).  The following portions of the chart were reviewed this encounter and updated as appropriate:  Tobacco  Allergies  Meds  Problems  Med Hx  Surg Hx  Fam Hx     Review of Systems:  No other skin or systemic complaints except as noted in HPI or Assessment and Plan.  Objective  Well appearing patient in no apparent distress; mood and affect are within normal limits.  A focused examination was performed including trunk. Relevant physical exam findings are noted in the Assessment and Plan.  Objective  Left Flank x 2, right breast x 1 (3): Erythematous keratotic or waxy stuck-on papule or plaque.    Assessment & Plan  Inflamed seborrheic keratosis (3) Left Flank x 2, right breast x 1  Destruction of lesion - Left Flank x 2, right breast x 1 Complexity: simple   Destruction method: cryotherapy   Informed consent: discussed and consent obtained   Timeout:  patient name, date of birth, surgical site, and procedure verified Lesion destroyed using liquid nitrogen: Yes   Region frozen until ice ball extended beyond lesion: Yes   Outcome: patient tolerated procedure well with no complications   Post-procedure details: wound care instructions given    Seborrheic Keratoses - Stuck-on, waxy, tan-brown papules and plaques  - Discussed benign etiology and prognosis. - Observe - Call for any changes  Actinic Damage - diffuse scaly erythematous macules with underlying dyspigmentation - Recommend daily broad spectrum sunscreen SPF 30+ to sun-exposed areas, reapply every 2 hours as needed.  - Call for new or changing lesions.  Return as scheduled, for TBSE.  Graciella Belton, RMA, am acting as scribe for Sarina Ser, MD . Documentation: I have reviewed the above documentation for  accuracy and completeness, and I agree with the above.  Sarina Ser, MD

## 2020-04-24 ENCOUNTER — Encounter: Payer: Self-pay | Admitting: Dermatology

## 2020-06-13 ENCOUNTER — Ambulatory Visit: Payer: PPO | Admitting: Nurse Practitioner

## 2020-06-13 ENCOUNTER — Encounter: Payer: Self-pay | Admitting: Nurse Practitioner

## 2020-06-13 ENCOUNTER — Other Ambulatory Visit: Payer: Self-pay

## 2020-06-13 VITALS — BP 115/64 | HR 87 | Temp 97.5°F | Ht 65.0 in | Wt 122.4 lb

## 2020-06-13 DIAGNOSIS — H00011 Hordeolum externum right upper eyelid: Secondary | ICD-10-CM

## 2020-06-13 MED ORDER — BACITRACIN-POLYMYXIN B 500-10000 UNIT/GM OP OINT: 1.0000 "application " | TOPICAL_OINTMENT | Freq: Four times a day (QID) | OPHTHALMIC | 0 refills | Status: AC

## 2020-06-13 NOTE — Progress Notes (Signed)
   Subjective:    Patient ID: Kathryn Hawkins, female    DOB: 10-Jan-1956, 64 y.o.   MRN: [id number]  HPI   64 year old female c/o right upper lid redness and swelling for the past few days. She denies eyelash makeup use, denies wearing contacts. No known other irritant.   She has been using warm cotton balls for some relief.   Today's Vitals   06/13/20 1116  BP: 115/64  Pulse: 87  Temp: (!) 97.5 F (36.4 C)  TempSrc: Temporal  SpO2: 100%  Weight: 122 lb 6.4 oz (55.5 kg)  Height: 5\' 5"  (1.651 m)   Body mass index is 20.37 kg/m.      Review of Systems  Constitutional: Negative.   HENT: Negative.   Eyes: Positive for pain and redness. Negative for visual disturbance.   Past Medical History:  Diagnosis Date  . Diverticulitis   . Dyspareunia in female   . Herniated disc, cervical   . 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 Vaginal dryness   . Vulvar atrophy      Objective:   Physical Exam Constitutional:      Appearance: Normal appearance.  HENT:     Head: Normocephalic.  Eyes:     General:        Right eye: Hordeolum present.     Conjunctiva/sclera: Conjunctivae normal.     Pupils: Pupils are equal, round, and reactive to light.      Comments: Hordeolum to right upper outer lid as above  Musculoskeletal:     Cervical back: Neck supple.  Neurological:     Mental Status: She is alert and oriented to person, place, and time.  Psychiatric:        Mood and Affect: Mood normal.           Assessment & Plan:  Patient advised to continue warm compress, after warm compress apply topical antibiotic ointment to eyelashes. Continue this until symptoms resolve. RTC if symptoms persist or with worsening symptoms as discussed.   Meds ordered this encounter  Medications  . bacitracin-polymyxin b (POLYSPORIN) ophthalmic ointment    Sig: Place 1 application into the right eye 4 (four) times daily for 7 days. Apply warm compress prior to ointment use      Dispense:  9.8 g    Refill:  0

## 2020-08-02 ENCOUNTER — Other Ambulatory Visit: Payer: Self-pay

## 2020-08-02 ENCOUNTER — Telehealth: Payer: PPO | Admitting: Nurse Practitioner

## 2020-08-02 ENCOUNTER — Ambulatory Visit: Payer: PPO | Admitting: Nurse Practitioner

## 2020-08-02 DIAGNOSIS — Z20822 Contact with and (suspected) exposure to covid-19: Secondary | ICD-10-CM

## 2020-08-02 LAB — POC COVID19 BINAXNOW: SARS Coronavirus 2 Ag: NEGATIVE

## 2020-08-02 NOTE — Progress Notes (Signed)
poc

## 2020-08-02 NOTE — Progress Notes (Signed)
   Subjective:    Patient ID: Kathryn Hawkins, female    DOB: October 07, 1955, 64 y.o.   MRN: [id number]  HPI  64 year old female here for virtual appointment after nurse visit for COVID testing  She has had a cough and congestion since 07/30/20.   She has not been using anything OTC.  Denies any close contacts for COVID  She has been fully vaccinated for COVID-19  Denies a history of asthma   Review of Systems  Constitutional: Negative.   HENT: Positive for congestion and rhinorrhea.   Respiratory: Positive for cough.   Genitourinary: Negative.   Musculoskeletal: Negative.   Neurological: Negative.         Objective:   Physical Exam  This is a telehealth appointment with patient after nurse visit for COVID testing  No acute distress assessed during phone conversation   Recent Results (from the past 2160 hour(s))  POC COVID-19     Status: Normal   Collection Time: 08/02/20  9:56 AM  Result Value Ref Range   SARS Coronavirus 2 Ag Negative Negative    Comment: Patient vaccinated. Patient aware of negative result. Virtual follow up with S.Emelie Newsom       Assessment & Plan:  Risk is low, will defer PCR at this time.   Patient may start Mucinex OTC for cough support Push fluids and rest over the weekend, support immune system.   RTC if symptoms persist with new concerns or worsening symptoms as discussed

## 2020-09-19 ENCOUNTER — Ambulatory Visit: Payer: PPO | Admitting: Dermatology

## 2020-09-19 ENCOUNTER — Other Ambulatory Visit: Payer: Self-pay

## 2020-09-19 ENCOUNTER — Encounter: Payer: Self-pay | Admitting: Dermatology

## 2020-09-19 DIAGNOSIS — D229 Melanocytic nevi, unspecified: Secondary | ICD-10-CM | POA: Diagnosis not present

## 2020-09-19 DIAGNOSIS — Z1283 Encounter for screening for malignant neoplasm of skin: Secondary | ICD-10-CM | POA: Diagnosis not present

## 2020-09-19 DIAGNOSIS — L821 Other seborrheic keratosis: Secondary | ICD-10-CM | POA: Diagnosis not present

## 2020-09-19 DIAGNOSIS — L609 Nail disorder, unspecified: Secondary | ICD-10-CM | POA: Diagnosis not present

## 2020-09-19 DIAGNOSIS — Z85820 Personal history of malignant melanoma of skin: Secondary | ICD-10-CM | POA: Diagnosis not present

## 2020-09-19 DIAGNOSIS — L814 Other melanin hyperpigmentation: Secondary | ICD-10-CM | POA: Diagnosis not present

## 2020-09-19 DIAGNOSIS — L578 Other skin changes due to chronic exposure to nonionizing radiation: Secondary | ICD-10-CM

## 2020-09-19 DIAGNOSIS — D1800 Hemangioma unspecified site: Secondary | ICD-10-CM | POA: Diagnosis not present

## 2020-09-19 NOTE — Progress Notes (Signed)
   Follow-Up Visit   Subjective  Kathryn Hawkins is a 65 y.o. female who presents for the following: total body skin exam (Hx of Melanoma L leg). The patient presents for Total-Body Skin Exam (TBSE) for skin cancer screening and mole check.  The following portions of the chart were reviewed this encounter and updated as appropriate:   Tobacco  Allergies  Meds  Problems  Med Hx  Surg Hx  Fam Hx     Review of Systems:  No other skin or systemic complaints except as noted in HPI or Assessment and Plan.  Objective  Well appearing patient in no apparent distress; mood and affect are within normal limits.  A full examination was performed including scalp, head, eyes, ears, nose, lips, neck, chest, axillae, abdomen, back, buttocks, bilateral upper extremities, bilateral lower extremities, hands, feet, fingers, toes, fingernails, and toenails. All findings within normal limits unless otherwise noted below.  Objective  L leg: Well healed scar with no evidence of recurrence, no lymphadenopathy.   Objective  Right great toenail: 1.0cm from proximal nail fold to dystrophy   Assessment & Plan  History of melanoma L leg 1994 Clear. No lymphadenopathy. Observe for recurrence. Call clinic for new or changing lesions.  Recommend regular skin exams, daily broad-spectrum spf 30+ sunscreen use, and photoprotection.     Nail problem Right great toenail Trauma vs Recurrent Tinea Discussed treating with Diflucan Pt prefers to observe for now   Lentigines - Scattered tan macules - Discussed due to sun exposure - Benign, observe - Call for any changes  Seborrheic Keratoses - Stuck-on, waxy, tan-brown papules and plaques  - Discussed benign etiology and prognosis. - Observe - Call for any changes  Melanocytic Nevi - Tan-brown and/or pink-flesh-colored symmetric macules and papules - Benign appearing on exam today - Observation - Call clinic for new or changing moles - Recommend  daily use of broad spectrum spf 30+ sunscreen to sun-exposed areas.   Hemangiomas - Red papules - Discussed benign nature - Observe - Call for any changes  Actinic Damage - Chronic, secondary to cumulative UV/sun exposure - diffuse scaly erythematous macules with underlying dyspigmentation - Recommend daily broad spectrum sunscreen SPF 30+ to sun-exposed areas, reapply every 2 hours as needed.  - Call for new or changing lesions.  Skin cancer screening performed today.  Return in about 1 year (around 09/19/2021) for TBSE, Hx of Melanoma.  I, Othelia Pulling, RMA, am acting as scribe for Sarina Ser, MD .  Documentation: I have reviewed the above documentation for accuracy and completeness, and I agree with the above.  Sarina Ser, MD

## 2020-10-03 DIAGNOSIS — M4726 Other spondylosis with radiculopathy, lumbar region: Secondary | ICD-10-CM | POA: Diagnosis not present

## 2020-10-03 DIAGNOSIS — M5116 Intervertebral disc disorders with radiculopathy, lumbar region: Secondary | ICD-10-CM | POA: Diagnosis not present

## 2020-10-03 DIAGNOSIS — M25551 Pain in right hip: Secondary | ICD-10-CM | POA: Diagnosis not present

## 2020-10-03 DIAGNOSIS — M4316 Spondylolisthesis, lumbar region: Secondary | ICD-10-CM | POA: Diagnosis not present

## 2020-10-03 DIAGNOSIS — Z9049 Acquired absence of other specified parts of digestive tract: Secondary | ICD-10-CM | POA: Diagnosis not present

## 2020-10-03 DIAGNOSIS — G8929 Other chronic pain: Secondary | ICD-10-CM | POA: Diagnosis not present

## 2020-10-09 DIAGNOSIS — Z79899 Other long term (current) drug therapy: Secondary | ICD-10-CM | POA: Diagnosis not present

## 2020-10-09 DIAGNOSIS — E538 Deficiency of other specified B group vitamins: Secondary | ICD-10-CM | POA: Diagnosis not present

## 2020-10-16 DIAGNOSIS — M25551 Pain in right hip: Secondary | ICD-10-CM | POA: Diagnosis not present

## 2020-10-16 DIAGNOSIS — Z0000 Encounter for general adult medical examination without abnormal findings: Secondary | ICD-10-CM | POA: Diagnosis not present

## 2020-10-16 DIAGNOSIS — F330 Major depressive disorder, recurrent, mild: Secondary | ICD-10-CM | POA: Diagnosis not present

## 2020-10-16 DIAGNOSIS — G8929 Other chronic pain: Secondary | ICD-10-CM | POA: Diagnosis not present

## 2020-10-16 DIAGNOSIS — Z79899 Other long term (current) drug therapy: Secondary | ICD-10-CM | POA: Diagnosis not present

## 2020-11-14 DIAGNOSIS — M1611 Unilateral primary osteoarthritis, right hip: Secondary | ICD-10-CM | POA: Diagnosis not present

## 2020-12-25 ENCOUNTER — Other Ambulatory Visit: Payer: Self-pay | Admitting: Orthopedic Surgery

## 2021-01-01 DIAGNOSIS — H2513 Age-related nuclear cataract, bilateral: Secondary | ICD-10-CM | POA: Diagnosis not present

## 2021-01-14 ENCOUNTER — Other Ambulatory Visit: Payer: Self-pay

## 2021-01-14 ENCOUNTER — Encounter
Admission: RE | Admit: 2021-01-14 | Discharge: 2021-01-14 | Disposition: A | Discharge status: Home / Self Care | Payer: PPO | Source: Ambulatory Visit | Attending: Orthopedic Surgery | Admitting: Orthopedic Surgery

## 2021-01-14 DIAGNOSIS — Z01818 Encounter for other preprocedural examination: Secondary | ICD-10-CM | POA: Insufficient documentation

## 2021-01-14 HISTORY — DX: Spinal stenosis, lumbar region without neurogenic claudication: M48.061

## 2021-01-14 LAB — COMPREHENSIVE METABOLIC PANEL
ALT: 14 U/L (ref 0–44)
AST: 18 U/L (ref 15–41)
Albumin: 3.6 g/dL (ref 3.5–5.0)
Alkaline Phosphatase: 45 U/L (ref 38–126)
Anion gap: 4 — ABNORMAL LOW (ref 5–15)
BUN: 18 mg/dL (ref 8–23)
CO2: 29 mmol/L (ref 22–32)
Calcium: 9.1 mg/dL (ref 8.9–10.3)
Chloride: 106 mmol/L (ref 98–111)
Creatinine, Ser: 0.62 mg/dL (ref 0.44–1.00)
GFR, Estimated: >60 mL/min (ref >60)
Glucose, Bld: 81 mg/dL (ref 70–99)
Potassium: 4 mmol/L (ref 3.5–5.1)
Sodium: 139 mmol/L (ref 135–145)
Total Bilirubin: 0.8 mg/dL (ref 0.3–1.2)
Total Protein: 6.2 g/dL — ABNORMAL LOW (ref 6.5–8.1)

## 2021-01-14 LAB — TYPE AND SCREEN
ABO/RH(D): O POS
Antibody Screen: NEGATIVE

## 2021-01-14 LAB — CBC WITH DIFFERENTIAL/PLATELET
Abs Immature Granulocytes: 0.02 10*3/uL (ref 0.00–0.07)
Basophils Absolute: 0.1 10*3/uL (ref 0.0–0.1)
Basophils Relative: 1 %
Eosinophils Absolute: 0.1 10*3/uL (ref 0.0–0.5)
Eosinophils Relative: 2 %
HCT: 38.2 % (ref 36.0–46.0)
Hemoglobin: 12.9 g/dL (ref 12.0–15.0)
Immature Granulocytes: 0 %
Lymphocytes Relative: 20 %
Lymphs Abs: 1.4 10*3/uL (ref 0.7–4.0)
MCH: 32.9 pg (ref 26.0–34.0)
MCHC: 33.8 g/dL (ref 30.0–36.0)
MCV: 97.4 fL (ref 80.0–100.0)
Monocytes Absolute: 0.5 10*3/uL (ref 0.1–1.0)
Monocytes Relative: 7 %
Neutro Abs: 4.7 10*3/uL (ref 1.7–7.7)
Neutrophils Relative %: 70 %
Platelets: 315 10*3/uL (ref 150–400)
RBC: 3.92 MIL/uL (ref 3.87–5.11)
RDW: 12.4 % (ref 11.5–15.5)
WBC: 6.8 10*3/uL (ref 4.0–10.5)
nRBC: 0 % (ref 0.0–0.2)

## 2021-01-14 LAB — URINALYSIS, ROUTINE W REFLEX MICROSCOPIC
Glucose, UA: NEGATIVE mg/dL
Hgb urine dipstick: NEGATIVE
Ketones, ur: NEGATIVE mg/dL
Leukocytes,Ua: NEGATIVE
Nitrite: NEGATIVE
Protein, ur: NEGATIVE mg/dL
Specific Gravity, Urine: 1.012 (ref 1.005–1.030)
pH: 5 (ref 5.0–8.0)

## 2021-01-14 LAB — SURGICAL PCR SCREEN
MRSA, PCR: NEGATIVE
Staphylococcus aureus: NEGATIVE

## 2021-01-14 NOTE — Patient Instructions (Addendum)
Your procedure is scheduled on: Tuesday, June 21 Report to the Registration Desk on the 1st floor of the [facility]. To find out your arrival time, please call [phone] between 1PM - 3PM on: Monday, June 20  REMEMBER: Instructions that are not followed completely may result in serious medical risk, up to and including death; or upon the discretion of your surgeon and anesthesiologist your surgery may need to be rescheduled.  Do not eat food after midnight the night before surgery.  No gum chewing, lozengers or hard candies.  You may however, drink CLEAR liquids up to 2 hours before you are scheduled to arrive for your surgery. Do not drink anything within 2 hours of your scheduled arrival time.  Clear liquids include: - water  - apple juice without pulp - gatorade (not RED, PURPLE, OR BLUE) - black coffee or tea (Do NOT add milk or creamers to the coffee or tea) Do NOT drink anything that is not on this list.  In addition, your doctor has ordered for you to drink the provided  Ensure Pre-Surgery Clear Carbohydrate Drink  Drinking this carbohydrate drink up to two hours before surgery helps to reduce insulin resistance and improve patient outcomes. Please complete drinking 2 hours prior to scheduled arrival time.  DO NOT TAKE ANY MEDICATIONS THE MORNING OF SURGERY  One week prior to surgery: Stop etodolac (Lodine), Anti-inflammatories (NSAIDS) such as Advil, Aleve, Ibuprofen, Motrin, Naproxen, Naprosyn and Aspirin based products such as Excedrin, Goodys Powder, BC Powder. Stop ANY OVER THE COUNTER supplements until after surgery. (Probiotic, glucosamine, multi-vitamin, calcium) You may however, continue to take Tylenol if needed for pain up until the day of surgery.  No Alcohol for 24 hours before or after surgery.  On the morning of surgery brush your teeth with toothpaste and water, you may rinse your mouth with mouthwash if you wish. Do not swallow any toothpaste or  mouthwash.  Do not wear jewelry, make-up, hairpins, clips or nail polish.  Do not wear lotions, powders, or perfumes.   Do not shave body from the neck down 48 hours prior to surgery just in case you cut yourself which could leave a site for infection.  Also, freshly shaved skin may become irritated if using the CHG soap.  Do not bring valuables to the hospital. [facility] is not responsible for any missing/lost belongings or valuables.   Use CHG Soap as directed on instruction sheet.  Notify your doctor if there is any change in your medical condition (cold, fever, infection).  Wear comfortable clothing (specific to your surgery type) to the hospital.  After surgery, you can help prevent lung complications by doing breathing exercises.  Take deep breaths and cough every 1-2 hours. Your doctor may order a device called an Incentive Spirometer to help you take deep breaths.  If you are being admitted to the hospital overnight, leave your suitcase in the car. After surgery it may be brought to your room.  If you are being discharged the day of surgery, you will not be allowed to drive home. You will need a responsible adult (18 years or older) to drive you home and stay with you that night.   If you are taking public transportation, you will need to have a responsible adult (18 years or older) with you. Please confirm with your physician that it is acceptable to use public transportation.   Please call the [place] Dept[facility] at [phone] if you have any questions about  these instructions.  Surgery Visitation Policy:  Patients undergoing a surgery or procedure may have one family member or support person with them as long as that person is not COVID-19 positive or experiencing its symptoms.  That person may remain in the waiting area during the procedure.  Inpatient Visitation:    Visiting hours are 7 a.m. to 8 p.m. Inpatients will be allowed two visitors daily.  The visitors may change each day during the patient's stay. No visitors under the age of 59. Any visitor under the age of 38 must be accompanied by an adult. The visitor must pass COVID-19 screenings, use hand sanitizer when entering and exiting the patient's room and wear a mask at all times, including in the patient's room. Patients must also wear a mask when staff or their visitor are in the room. Masking is required regardless of vaccination status.  Total Hip/Knee Replacement Preoperative Educational Video  To better prepare for surgery, please view our videos that explain the physical activity and discharge planning required to have the best surgical recovery at [facility].  [email or web address]      Questions? Call [phone] or email [email or web address]

## 2021-01-18 ENCOUNTER — Other Ambulatory Visit
Admission: RE | Admit: 2021-01-18 | Discharge: 2021-01-18 | Disposition: A | Discharge status: Home / Self Care | Payer: PPO | Source: Ambulatory Visit | Attending: Orthopedic Surgery | Admitting: Orthopedic Surgery

## 2021-01-18 ENCOUNTER — Other Ambulatory Visit: Payer: Self-pay

## 2021-01-18 DIAGNOSIS — Z20822 Contact with and (suspected) exposure to covid-19: Secondary | ICD-10-CM | POA: Diagnosis not present

## 2021-01-18 DIAGNOSIS — Z01812 Encounter for preprocedural laboratory examination: Secondary | ICD-10-CM | POA: Diagnosis not present

## 2021-01-18 LAB — SARS CORONAVIRUS 2 (TAT 6-24 HRS): SARS Coronavirus 2: NEGATIVE

## 2021-01-22 ENCOUNTER — Encounter
Admission: RE | Disposition: A | Discharge status: Home Health | Payer: Self-pay | Source: Home / Self Care | Attending: Orthopedic Surgery

## 2021-01-22 ENCOUNTER — Other Ambulatory Visit: Payer: Self-pay

## 2021-01-22 ENCOUNTER — Inpatient Hospital Stay: Payer: PPO | Admitting: Anesthesiology

## 2021-01-22 ENCOUNTER — Observation Stay: Payer: PPO

## 2021-01-22 ENCOUNTER — Inpatient Hospital Stay
Admission: RE | Admit: 2021-01-22 | Discharge: 2021-01-23 | DRG: 470 | Disposition: A | Discharge status: Home Health | Payer: PPO | Attending: Orthopedic Surgery | Admitting: Orthopedic Surgery

## 2021-01-22 ENCOUNTER — Inpatient Hospital Stay: Payer: PPO

## 2021-01-22 ENCOUNTER — Encounter: Payer: Self-pay | Admitting: Orthopedic Surgery

## 2021-01-22 DIAGNOSIS — K5792 Diverticulitis of intestine, part unspecified, without perforation or abscess without bleeding: Secondary | ICD-10-CM | POA: Diagnosis present

## 2021-01-22 DIAGNOSIS — Z471 Aftercare following joint replacement surgery: Secondary | ICD-10-CM | POA: Diagnosis not present

## 2021-01-22 DIAGNOSIS — Z96641 Presence of right artificial hip joint: Secondary | ICD-10-CM

## 2021-01-22 DIAGNOSIS — M8580 Other specified disorders of bone density and structure, unspecified site: Secondary | ICD-10-CM | POA: Diagnosis present

## 2021-01-22 DIAGNOSIS — Z8349 Family history of other endocrine, nutritional and metabolic diseases: Secondary | ICD-10-CM

## 2021-01-22 DIAGNOSIS — M1611 Unilateral primary osteoarthritis, right hip: Principal | ICD-10-CM | POA: Diagnosis present

## 2021-01-22 DIAGNOSIS — K648 Other hemorrhoids: Secondary | ICD-10-CM | POA: Diagnosis present

## 2021-01-22 DIAGNOSIS — G8929 Other chronic pain: Secondary | ICD-10-CM | POA: Diagnosis present

## 2021-01-22 DIAGNOSIS — Z85820 Personal history of malignant melanoma of skin: Secondary | ICD-10-CM

## 2021-01-22 DIAGNOSIS — Z90710 Acquired absence of both cervix and uterus: Secondary | ICD-10-CM

## 2021-01-22 DIAGNOSIS — M5020 Other cervical disc displacement, unspecified cervical region: Secondary | ICD-10-CM | POA: Diagnosis present

## 2021-01-22 DIAGNOSIS — Z79899 Other long term (current) drug therapy: Secondary | ICD-10-CM

## 2021-01-22 DIAGNOSIS — Z825 Family history of asthma and other chronic lower respiratory diseases: Secondary | ICD-10-CM | POA: Diagnosis not present

## 2021-01-22 DIAGNOSIS — Z8262 Family history of osteoporosis: Secondary | ICD-10-CM | POA: Diagnosis not present

## 2021-01-22 DIAGNOSIS — Z801 Family history of malignant neoplasm of trachea, bronchus and lung: Secondary | ICD-10-CM

## 2021-01-22 DIAGNOSIS — M5136 Other intervertebral disc degeneration, lumbar region: Secondary | ICD-10-CM | POA: Diagnosis present

## 2021-01-22 DIAGNOSIS — Z803 Family history of malignant neoplasm of breast: Secondary | ICD-10-CM | POA: Diagnosis not present

## 2021-01-22 DIAGNOSIS — E559 Vitamin D deficiency, unspecified: Secondary | ICD-10-CM | POA: Diagnosis present

## 2021-01-22 DIAGNOSIS — M48061 Spinal stenosis, lumbar region without neurogenic claudication: Secondary | ICD-10-CM | POA: Diagnosis present

## 2021-01-22 DIAGNOSIS — Z419 Encounter for procedure for purposes other than remedying health state, unspecified: Secondary | ICD-10-CM

## 2021-01-22 DIAGNOSIS — G8918 Other acute postprocedural pain: Secondary | ICD-10-CM

## 2021-01-22 HISTORY — PX: TOTAL HIP ARTHROPLASTY: SHX124

## 2021-01-22 LAB — CBC
HCT: 36.9 % (ref 36.0–46.0)
Hemoglobin: 12.7 g/dL (ref 12.0–15.0)
MCH: 33.3 pg (ref 26.0–34.0)
MCHC: 34.4 g/dL (ref 30.0–36.0)
MCV: 96.9 fL (ref 80.0–100.0)
Platelets: 272 10*3/uL (ref 150–400)
RBC: 3.81 MIL/uL — ABNORMAL LOW (ref 3.87–5.11)
RDW: 12 % (ref 11.5–15.5)
WBC: 12.3 10*3/uL — ABNORMAL HIGH (ref 4.0–10.5)
nRBC: 0 % (ref 0.0–0.2)

## 2021-01-22 LAB — CREATININE, SERUM
Creatinine, Ser: 0.54 mg/dL (ref 0.44–1.00)
GFR, Estimated: >60 mL/min (ref >60)

## 2021-01-22 LAB — ABO/RH: ABO/RH(D): O POS

## 2021-01-22 SURGERY — ARTHROPLASTY, HIP, TOTAL, ANTERIOR APPROACH
Anesthesia: Spinal | Site: Hip | Laterality: Right

## 2021-01-22 MED ORDER — BISACODYL 10 MG RE SUPP
10.0000 mg | Freq: Every day | RECTAL | Status: DC | PRN
  Filled 2021-01-22: qty 1

## 2021-01-22 MED ORDER — SODIUM CHLORIDE 0.9 % IV SOLN: INTRAVENOUS | Status: DC

## 2021-01-22 MED ORDER — LIDOCAINE HCL (PF) 1 % IJ SOLN
INTRAMUSCULAR | Status: DC | PRN
  Administered 2021-01-22: 1 mL

## 2021-01-22 MED ORDER — CYCLOBENZAPRINE HCL 10 MG PO TABS
5.0000 mg | ORAL_TABLET | Freq: Three times a day (TID) | ORAL | Status: DC | PRN
  Filled 2021-01-22: qty 1

## 2021-01-22 MED ORDER — DIPHENHYDRAMINE HCL 12.5 MG/5ML PO ELIX
12.5000 mg | ORAL_SOLUTION | ORAL | Status: DC | PRN
  Filled 2021-01-22: qty 10

## 2021-01-22 MED ORDER — MENTHOL 3 MG MT LOZG
1.0000 | LOZENGE | OROMUCOSAL | Status: DC | PRN
  Filled 2021-01-22: qty 9

## 2021-01-22 MED ORDER — OXYCODONE HCL 5 MG PO TABS: 5.0000 mg | ORAL_TABLET | Freq: Once | ORAL | Status: DC | PRN

## 2021-01-22 MED ORDER — SODIUM CHLORIDE 0.9 % IV SOLN
INTRAVENOUS | Status: DC | PRN
  Administered 2021-01-22: 30 ug/min via INTRAVENOUS

## 2021-01-22 MED ORDER — FAMOTIDINE 20 MG PO TABS: 20.0000 mg | ORAL_TABLET | Freq: Once | ORAL | Status: AC

## 2021-01-22 MED ORDER — ONDANSETRON HCL 4 MG PO TABS: 4.0000 mg | ORAL_TABLET | Freq: Four times a day (QID) | ORAL | Status: DC | PRN

## 2021-01-22 MED ORDER — BUPIVACAINE HCL (PF) 0.5 % IJ SOLN
INTRAMUSCULAR | Status: DC | PRN
  Administered 2021-01-22: 2.4 mL via INTRATHECAL

## 2021-01-22 MED ORDER — MORPHINE SULFATE (PF) 2 MG/ML IV SOLN: 0.5000 mg | INTRAVENOUS | Status: DC | PRN

## 2021-01-22 MED ORDER — ACETAMINOPHEN 325 MG PO TABS: 325.0000 mg | ORAL_TABLET | Freq: Four times a day (QID) | ORAL | Status: DC | PRN

## 2021-01-22 MED ORDER — ESTRADIOL 0.1 MG/GM VA CREA
1.0000 | TOPICAL_CREAM | VAGINAL | Status: DC
  Filled 2021-01-22: qty 42.5

## 2021-01-22 MED ORDER — BUPIVACAINE-EPINEPHRINE 0.25% -1:200000 IJ SOLN
INTRAMUSCULAR | Status: DC | PRN
  Administered 2021-01-22: 30 mL

## 2021-01-22 MED ORDER — PROGESTERONE MICRONIZED 100 MG PO CAPS: 200.0000 mg | ORAL_CAPSULE | Freq: Every day | ORAL | Status: DC

## 2021-01-22 MED ORDER — ACETAMINOPHEN 10 MG/ML IV SOLN
INTRAVENOUS | Status: DC | PRN
  Administered 2021-01-22: 1000 mg via INTRAVENOUS

## 2021-01-22 MED ORDER — MIDAZOLAM HCL 2 MG/2ML IJ SOLN
INTRAMUSCULAR | Status: AC
  Filled 2021-01-22: qty 2

## 2021-01-22 MED ORDER — ADULT MULTIVITAMIN W/MINERALS CH
1.0000 | ORAL_TABLET | Freq: Every day | ORAL | Status: DC
  Administered 2021-01-23: 1 via ORAL
  Filled 2021-01-22: qty 1

## 2021-01-22 MED ORDER — CHLORHEXIDINE GLUCONATE 0.12 % MT SOLN: 15.0000 mL | Freq: Once | OROMUCOSAL | Status: AC

## 2021-01-22 MED ORDER — FENTANYL CITRATE (PF) 100 MCG/2ML IJ SOLN
INTRAMUSCULAR | Status: AC
  Filled 2021-01-22: qty 2

## 2021-01-22 MED ORDER — MIDAZOLAM HCL 5 MG/5ML IJ SOLN
INTRAMUSCULAR | Status: DC | PRN
  Administered 2021-01-22: 2 mg via INTRAVENOUS

## 2021-01-22 MED ORDER — ZOLPIDEM TARTRATE 5 MG PO TABS: 5.0000 mg | ORAL_TABLET | Freq: Every evening | ORAL | Status: DC | PRN

## 2021-01-22 MED ORDER — ENOXAPARIN SODIUM 40 MG/0.4ML IJ SOSY
40.0000 mg | PREFILLED_SYRINGE | INTRAMUSCULAR | Status: DC
  Administered 2021-01-23: 40 mg via SUBCUTANEOUS
  Filled 2021-01-22: qty 0.4

## 2021-01-22 MED ORDER — CEFAZOLIN SODIUM-DEXTROSE 2-4 GM/100ML-% IV SOLN
2.0000 g | INTRAVENOUS | Status: AC
  Administered 2021-01-22: 2 g via INTRAVENOUS

## 2021-01-22 MED ORDER — OXYCODONE HCL 5 MG/5ML PO SOLN: 5.0000 mg | Freq: Once | ORAL | Status: DC | PRN

## 2021-01-22 MED ORDER — METOCLOPRAMIDE HCL 5 MG/ML IJ SOLN: 5.0000 mg | Freq: Three times a day (TID) | INTRAMUSCULAR | Status: DC | PRN

## 2021-01-22 MED ORDER — ONDANSETRON HCL 4 MG/2ML IJ SOLN: 4.0000 mg | Freq: Once | INTRAMUSCULAR | Status: DC | PRN

## 2021-01-22 MED ORDER — PROPOFOL 500 MG/50ML IV EMUL
INTRAVENOUS | Status: DC | PRN
  Administered 2021-01-22: 100 ug/kg/min via INTRAVENOUS

## 2021-01-22 MED ORDER — ESTRADIOL 1 MG PO TABS: 0.5000 mg | ORAL_TABLET | Freq: Every day | ORAL | Status: DC

## 2021-01-22 MED ORDER — METOCLOPRAMIDE HCL 10 MG PO TABS: 5.0000 mg | ORAL_TABLET | Freq: Three times a day (TID) | ORAL | Status: DC | PRN

## 2021-01-22 MED ORDER — ORAL CARE MOUTH RINSE: 15.0000 mL | Freq: Once | OROMUCOSAL | Status: AC

## 2021-01-22 MED ORDER — LACTATED RINGERS IV SOLN: INTRAVENOUS | Status: DC

## 2021-01-22 MED ORDER — CEFAZOLIN SODIUM-DEXTROSE 1-4 GM/50ML-% IV SOLN
1.0000 g | Freq: Four times a day (QID) | INTRAVENOUS | Status: AC
  Administered 2021-01-22 – 2021-01-23 (×2): 1 g via INTRAVENOUS
  Filled 2021-01-22 (×5): qty 50

## 2021-01-22 MED ORDER — PANTOPRAZOLE SODIUM 40 MG PO TBEC
40.0000 mg | DELAYED_RELEASE_TABLET | Freq: Every day | ORAL | Status: DC
  Administered 2021-01-22 – 2021-01-23 (×2): 40 mg via ORAL
  Filled 2021-01-22 (×2): qty 1

## 2021-01-22 MED ORDER — HYDROCODONE-ACETAMINOPHEN 7.5-325 MG PO TABS: 1.0000 | ORAL_TABLET | ORAL | Status: DC | PRN

## 2021-01-22 MED ORDER — ACETAMINOPHEN 10 MG/ML IV SOLN: 1000.0000 mg | Freq: Once | INTRAVENOUS | Status: DC | PRN

## 2021-01-22 MED ORDER — ACETAMINOPHEN 10 MG/ML IV SOLN
INTRAVENOUS | Status: AC
  Filled 2021-01-22: qty 100

## 2021-01-22 MED ORDER — HYDROCODONE-ACETAMINOPHEN 5-325 MG PO TABS
1.0000 | ORAL_TABLET | ORAL | Status: DC | PRN
  Administered 2021-01-22: 1 via ORAL
  Administered 2021-01-23 (×3): 2 via ORAL
  Filled 2021-01-22 (×4): qty 2

## 2021-01-22 MED ORDER — KETAMINE HCL 10 MG/ML IJ SOLN
INTRAMUSCULAR | Status: DC | PRN
  Administered 2021-01-22: 20 mg via INTRAVENOUS
  Administered 2021-01-22: 10 mg via INTRAVENOUS

## 2021-01-22 MED ORDER — TRAMADOL HCL 50 MG PO TABS
50.0000 mg | ORAL_TABLET | Freq: Four times a day (QID) | ORAL | Status: DC
  Administered 2021-01-22 – 2021-01-23 (×3): 50 mg via ORAL
  Filled 2021-01-22 (×3): qty 1

## 2021-01-22 MED ORDER — MAGNESIUM CITRATE PO SOLN
1.0000 | Freq: Once | ORAL | Status: DC | PRN
  Filled 2021-01-22: qty 296

## 2021-01-22 MED ORDER — PHENOL 1.4 % MT LIQD
1.0000 | OROMUCOSAL | Status: DC | PRN
  Filled 2021-01-22: qty 177

## 2021-01-22 MED ORDER — ONDANSETRON HCL 4 MG/2ML IJ SOLN: 4.0000 mg | Freq: Four times a day (QID) | INTRAMUSCULAR | Status: DC | PRN

## 2021-01-22 MED ORDER — BUPROPION HCL ER (SR) 150 MG PO TB12: 150.0000 mg | ORAL_TABLET | Freq: Every day | ORAL | Status: DC

## 2021-01-22 MED ORDER — CEFAZOLIN SODIUM-DEXTROSE 2-4 GM/100ML-% IV SOLN
INTRAVENOUS | Status: AC
  Filled 2021-01-22: qty 100

## 2021-01-22 MED ORDER — SODIUM CHLORIDE 0.9 % IV SOLN
INTRAVENOUS | Status: DC | PRN
  Administered 2021-01-22: 60 mL

## 2021-01-22 MED ORDER — DOCUSATE SODIUM 100 MG PO CAPS
100.0000 mg | ORAL_CAPSULE | Freq: Two times a day (BID) | ORAL | Status: DC
  Administered 2021-01-22 – 2021-01-23 (×2): 100 mg via ORAL
  Filled 2021-01-22 (×2): qty 1

## 2021-01-22 MED ORDER — ALUM & MAG HYDROXIDE-SIMETH 200-200-20 MG/5ML PO SUSP: 30.0000 mL | ORAL | Status: DC | PRN

## 2021-01-22 MED ORDER — PHENYLEPHRINE HCL (PRESSORS) 10 MG/ML IV SOLN
INTRAVENOUS | Status: DC | PRN
  Administered 2021-01-22 (×4): 100 ug via INTRAVENOUS

## 2021-01-22 MED ORDER — FENTANYL CITRATE (PF) 100 MCG/2ML IJ SOLN: 25.0000 ug | INTRAMUSCULAR | Status: DC | PRN

## 2021-01-22 MED ORDER — FAMOTIDINE 20 MG PO TABS
ORAL_TABLET | ORAL | Status: AC
  Administered 2021-01-22: 20 mg via ORAL
  Filled 2021-01-22: qty 1

## 2021-01-22 MED ORDER — CHLORHEXIDINE GLUCONATE 0.12 % MT SOLN
OROMUCOSAL | Status: AC
  Administered 2021-01-22: 15 mL via OROMUCOSAL
  Filled 2021-01-22: qty 15

## 2021-01-22 MED ORDER — POLYETHYLENE GLYCOL 3350 17 G PO PACK
17.0000 g | PACK | Freq: Every day | ORAL | Status: DC | PRN
  Filled 2021-01-22: qty 1

## 2021-01-22 SURGICAL SUPPLY — 61 items
BLADE SAGITTAL AGGR TOOTH XLG (BLADE) ×2 IMPLANT
BNDG COHESIVE 6X5 TAN STRL LF (GAUZE/BANDAGES/DRESSINGS) ×6 IMPLANT
CANISTER SUCT 1200ML W/VALVE (MISCELLANEOUS) ×2 IMPLANT
CANISTER WOUND CARE 500ML ATS (WOUND CARE) ×2 IMPLANT
CHLORAPREP W/TINT 26 (MISCELLANEOUS) ×2 IMPLANT
COVER BACK TABLE REUSABLE LG (DRAPES) ×2 IMPLANT
COVER WAND RF STERILE (DRAPES) ×2 IMPLANT
DRAPE 3/4 80X56 (DRAPES) ×6 IMPLANT
DRAPE C-ARM XRAY 36X54 (DRAPES) ×2 IMPLANT
DRAPE INCISE IOBAN 66X60 STRL (DRAPES) IMPLANT
DRAPE POUCH INSTRU U-SHP 10X18 (DRAPES) ×2 IMPLANT
DRESSING SURGICEL FIBRLLR 1X2 (HEMOSTASIS) ×2 IMPLANT
DRSG MEPILEX SACRM 8.7X9.8 (GAUZE/BANDAGES/DRESSINGS) ×2 IMPLANT
DRSG OPSITE POSTOP 4X8 (GAUZE/BANDAGES/DRESSINGS) ×4 IMPLANT
DRSG SURGICEL FIBRILLAR 1X2 (HEMOSTASIS) ×4
ELECT BLADE 6.5 EXT (BLADE) ×2 IMPLANT
ELECT REM PT RETURN 9FT ADLT (ELECTROSURGICAL) ×2
ELECTRODE REM PT RTRN 9FT ADLT (ELECTROSURGICAL) ×1 IMPLANT
GLOVE SURG SYN 9.0  PF PI (GLOVE) ×4
GLOVE SURG SYN 9.0 PF PI (GLOVE) ×2 IMPLANT
GLOVE SURG UNDER POLY LF SZ9 (GLOVE) ×2 IMPLANT
GOWN SRG 2XL LVL 4 RGLN SLV (GOWNS) ×1 IMPLANT
GOWN STRL NON-REIN 2XL LVL4 (GOWNS) ×2
GOWN STRL REUS W/ TWL LRG LVL3 (GOWN DISPOSABLE) ×1 IMPLANT
GOWN STRL REUS W/TWL LRG LVL3 (GOWN DISPOSABLE) ×2
HEAD FEMORAL 28MM SZ M (Head) ×2 IMPLANT
HEMOVAC 400CC 10FR (MISCELLANEOUS) IMPLANT
HOLDER FOLEY CATH W/STRAP (MISCELLANEOUS) ×2 IMPLANT
HOOD PEEL AWAY FLYTE STAYCOOL (MISCELLANEOUS) ×2 IMPLANT
IRRIGATION SURGIPHOR STRL (IV SOLUTION) IMPLANT
KIT PREVENA INCISION MGT 13 (CANNISTER) ×2 IMPLANT
LINER DBL MOB SZ 0 52MM (Liner) ×2 IMPLANT
MANIFOLD NEPTUNE II (INSTRUMENTS) ×2 IMPLANT
MAT ABSORB  FLUID 56X50 GRAY (MISCELLANEOUS) ×2
MAT ABSORB FLUID 56X50 GRAY (MISCELLANEOUS) ×1 IMPLANT
NDL SAFETY ECLIPSE 18X1.5 (NEEDLE) ×1 IMPLANT
NEEDLE HYPO 18GX1.5 SHARP (NEEDLE) ×2
NEEDLE SPNL 20GX3.5 QUINCKE YW (NEEDLE) ×4 IMPLANT
NS IRRIG 1000ML POUR BTL (IV SOLUTION) ×2 IMPLANT
PACK HIP COMPR (MISCELLANEOUS) ×2 IMPLANT
SCALPEL PROTECTED #10 DISP (BLADE) ×4 IMPLANT
SHELL ACETABULAR SZ 52 DM (Shell) ×2 IMPLANT
SOL PREP PVP 2OZ (MISCELLANEOUS) ×2
SOLUTION PREP PVP 2OZ (MISCELLANEOUS) ×1 IMPLANT
SPONGE DRAIN TRACH 4X4 STRL 2S (GAUZE/BANDAGES/DRESSINGS) ×2 IMPLANT
STAPLER SKIN PROX 35W (STAPLE) ×2 IMPLANT
STEM FEMORAL SZ3  STD COLLARED (Stem) ×2 IMPLANT
STRAP SAFETY 5IN WIDE (MISCELLANEOUS) ×2 IMPLANT
SUT DVC 2 QUILL PDO  T11 36X36 (SUTURE) ×2
SUT DVC 2 QUILL PDO T11 36X36 (SUTURE) ×1 IMPLANT
SUT SILK 0 (SUTURE) ×2
SUT SILK 0 30XBRD TIE 6 (SUTURE) ×1 IMPLANT
SUT V-LOC 90 ABS DVC 3-0 CL (SUTURE) ×2 IMPLANT
SUT VIC AB 1 CT1 36 (SUTURE) ×2 IMPLANT
SYR 20ML LL LF (SYRINGE) ×2 IMPLANT
SYR 30ML LL (SYRINGE) ×2 IMPLANT
SYR 50ML LL SCALE MARK (SYRINGE) ×4 IMPLANT
SYR BULB IRRIG 60ML STRL (SYRINGE) ×2 IMPLANT
TAPE MICROFOAM 4IN (TAPE) ×2 IMPLANT
TOWEL OR 17X26 4PK STRL BLUE (TOWEL DISPOSABLE) ×2 IMPLANT
TRAY FOLEY MTR SLVR 16FR STAT (SET/KITS/TRAYS/PACK) ×2 IMPLANT

## 2021-01-22 NOTE — Anesthesia Procedure Notes (Signed)
Spinal  Patient location during procedure: OR Start time: 01/22/2021 12:50 PM End time: 01/22/2021 12:59 PM Reason for block: surgical anesthesia Staffing Performed: anesthesiologist  Anesthesiologist: Arita Miss, MD Preanesthetic Checklist Completed: patient identified, IV checked, site marked, risks and benefits discussed, surgical consent, monitors and equipment checked, pre-op evaluation and timeout performed Spinal Block Patient position: sitting Prep: ChloraPrep Patient monitoring: heart rate, continuous pulse ox, blood pressure and cardiac monitor Approach: midline Location: L3-4 Injection technique: single-shot Needle Needle type: Whitacre and Introducer  Needle gauge: 24 G Needle length: 9 cm Assessment Sensory level: T10 Events: CSF return Additional Notes Negative paresthesia. Negative blood return. Positive free-flowing CSF. Expiration date of kit checked and confirmed. Patient tolerated procedure well, without complications.

## 2021-01-22 NOTE — Anesthesia Preprocedure Evaluation (Signed)
Anesthesia Evaluation  Patient identified by MRN, date of birth, ID band Patient awake    Reviewed: Allergy & Precautions, NPO status , Patient's Chart, lab work & pertinent test results  History of Anesthesia Complications Negative for: history of anesthetic complications  Airway Mallampati: II  TM Distance: >3 FB Neck ROM: Full    Dental no notable dental hx. (+) Teeth Intact   Pulmonary neg pulmonary ROS, neg sleep apnea, neg COPD, Patient abstained from smoking.Not current smoker,    Pulmonary exam normal breath sounds clear to auscultation       Cardiovascular Exercise Tolerance: Good METS(-) hypertension(-) CAD and (-) Past MI negative cardio ROS  (-) dysrhythmias  Rhythm:Regular Rate:Normal - Systolic murmurs    Neuro/Psych negative neurological ROS  negative psych ROS   GI/Hepatic neg GERD  ,(+)     (-) substance abuse  ,   Endo/Other  neg diabetes  Renal/GU negative Renal ROS     Musculoskeletal  (+) Arthritis ,   Abdominal   Peds  Hematology   Anesthesia Other Findings Past Medical History: 2017: DDD (degenerative disc disease), lumbar No date: Diverticulitis No date: Dyspareunia in female No date: Herniated disc, cervical No date: Lumbar spinal stenosis 1994: Melanoma ([place])     Comment:  Left leg. Level II, per patient 2016: Osteopenia No date: Vaginal dryness No date: Vulvar atrophy  Reproductive/Obstetrics                             Anesthesia Physical Anesthesia Plan  ASA: 2  Anesthesia Plan: Spinal   Post-op Pain Management:    Induction: Intravenous  PONV Risk Score and Plan: 2 and Ondansetron, Dexamethasone, Propofol infusion, TIVA and Midazolam  Airway Management Planned: Natural Airway  Additional Equipment: None  Intra-op Plan:   Post-operative Plan:   Informed Consent: I have reviewed the patients History and Physical, chart, labs and  discussed the procedure including the risks, benefits and alternatives for the proposed anesthesia with the patient or authorized representative who has indicated his/her understanding and acceptance.       Plan Discussed with: CRNA and Surgeon  Anesthesia Plan Comments: (Discussed R/B/A of neuraxial anesthesia technique with patient: - rare risks of spinal/epidural hematoma, nerve damage, infection - Risk of PDPH - Risk of nausea and vomiting - Risk of conversion to general anesthesia and its associated risks, including sore throat, damage to lips/teeth/oropharynx, and rare risks such as cardiac and respiratory events.  Patient voiced understanding.)        Anesthesia Quick Evaluation

## 2021-01-22 NOTE — H&P (Signed)
Chief Complaint: Right hip pain   History of the Present Illness: Kathryn Hawkins is a 65 y.o. female here today for history and physical for right total hip arthroplasty with Dr. Hessie Knows on 01/22/2021. Patient has suffered from chronic hip pain. Pain is progressively increased. X-rays from March 2022 show marked central hip osteoarthritis, circumferential osteophytes around the femoral head and complete loss of medial joint space. Patient has right lateral hip pain radiating down to the knee that she describes as deep radiating electrical pain. Patient has increased pain with standing, walking. She has pain with activities involving hip flexion. Pain is interfering with quality life and activities daily living. Patient is seen Dr. Rudene Christians and discussed total hip arthroplasty, she would like to proceed.  No history of blood clots. She has an allergy to cobalt.  The patient is employed as a Engineer, structural and education.  I have reviewed past medical, surgical, social and family history, and allergies as documented in the EMR.  Past Medical History: Past Medical History:  Diagnosis Date   Calculus of gallbladder with acute cholecystitis without obstruction 08/04/2017   DDD (degenerative disc disease), lumbar 03/25/2016  MRI Progressive spinal stenosis L3/L4   Gluteal tendinitis, right hip 01/27/2018   History of anesthesia reaction  slow to wake up   History of bladder suspension procedure- persistent 10/13/2013   L4-L5 disc bulge  MILD BULGE L2-L3, L5-S1   Lumbar spinal stenosis 01/27/2018   Osteopenia 05/18/2015  Formatting of this note might be different from the original. Last BDS 05/25/14   Pain in limb 08/14/2008  Formatting of this note might be different from the original. 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  REMOVAL OF KELOID RT FOREFINGER   MALIGNANT MELONOMA REMOVAL Left 1994  LEVEL 2-MALIGNANT MELANOMA REMOVAL (LT LEG)   OOPHORECTOMY 1994   REVISION/REMOVAL PROSTHETIC VAGINAL GRAFT N/A 01/06/2014  Procedure: REVISION (INCLUDING REMOVAL) OF PROSTHETIC VAGINAL GRAFT; VAGINAL APPROACH; Surgeon: Delanna Notice, MD; Location: [facility] OR; Service: Gynecology; Laterality: N/A;   TUBAL LIGATION  Prior to [place] W/CYSTOURETHROSCOPY N/A 01/06/2014  Procedure: URETHROLYSIS, TRANSVAGINAL, SECONDARY, OPEN, INCLUDING CYSTOURETHROSCOPY (EG, POSTSURGICAL OBSTRUCTION, SCARRING); Surgeon: Delanna Notice, MD; Location: [facility] OR; Service: Gynecology; Laterality: N/A;   Past Family History: Family History  Problem Relation Age of Onset   Thyroid disease Mother   Osteoporosis (Thinning of bones) Mother   Squamous cell carcinoma Father   Breast cancer Paternal Grandmother   Asthma Brother   Thyroid disease Sister   Prostate cancer Other   Lung cancer Maternal Uncle   Anesthesia problems Neg Hx   Malignant hyperthermia Neg Hx   Medications: Current Outpatient Medications Ordered in Epic  Medication Sig Dispense Refill   CALCIUM ORAL Take by mouth once daily With magnesium & vitamin d   cholecalciferol (VITAMIN D3) 1000 unit tablet Take 1,000 Units by mouth once daily.   cyclobenzaprine (FLEXERIL) 5 MG tablet TAKE ONE TABLET BY MOUTH 3 TIMES DAILY AS NEEDED FOR MUSCLE SPASMS 90 tablet 1  estradioL (ESTRACE) 0.01 % (0.1 mg/gram) vaginal cream Place 0.5 g vaginally twice a week Twice a week 42.5 g 11   estradioL (ESTRACE) 0.5 MG tablet Take 1 tablet (0.5 mg total) by mouth once  daily 3 weeks on and 1 week off 90 tablet 3   etodolac (LODINE) 400 MG tablet Take 1 tablet (400 mg total) by mouth 2 (two) times daily 60 tablet 11   Herbal Supplement Herbal Name: Glucosamine and chondrotin   Lactobacillus acidophilus (PROBIOTIC ORAL) Take by mouth One daily   montelukast (SINGULAIR) 10 mg tablet Take 1 tablet (10 mg total) by mouth every evening 90 tablet 3   progesterone (PROMETRIUM) 200 MG capsule Take 1 capsule (200 mg total) by mouth once daily 90 capsule 3   No current Epic-ordered facility-administered medications on file.   Allergies: Allergies  Allergen Reactions   Cobalt Rash   Nickel Rash   Penicillin V Potassium Unknown   Penicillins Other (See Comments)  Yeast Infection   Thimerosal Rash    Body mass index is 21.13 kg/m.  Review of Systems: A comprehensive 14 point ROS was performed, reviewed, and the pertinent orthopaedic findings are documented in the HPI.  Vitals:  01/14/21 1525  BP: 118/74    General Physical Examination:   General:  Well developed, well nourished, no apparent distress, normal affect, normal gait with no antalgic component.   HEENT: Head normocephalic, atraumatic   Heart: Examination of the heart reveals regular, rate, and rhythm. There is no murmur noted on ascultation. There is a normal apical pulse.  Lungs: Lungs are clear to auscultation. There is no wheeze, rhonchi, or crackles. There is normal expansion of bilateral chest walls.   Musculoskeletal Examination: Right hip internal rotation is 30 degrees, external is 25 degrees. Patient has right lateral hip, thigh pain reproduced with right hip internal rotation. She has no swelling or edema throughout the lower extremities.  Radiographs:  X-rays of the right hip reviewed by me today shows severe degenerative changes throughout the right hip with complete loss of central joint space  Assessment: ICD-10-CM  1. Primary localized osteoarthritis of right hip  M16.11   Plan: 86. 65 year old female with advanced right hip osteoarthritis. Pain has been progressing and interfering with quality of life and activity living. Risks, benefits, complications of a right total hip arthroplasty have been discussed with the patient. Patient has agreed and consented procedure with Dr. Hessie Knows on 01/22/2021.   Electronically signed by Feliberto Gottron, [place] at 01/14/2021 4:59 PM EDT  Reviewed  H+P. No changes noted.

## 2021-01-22 NOTE — Evaluation (Signed)
Physical Therapy Evaluation Patient Details Name: Kathryn Hawkins MRN: [id number] DOB: 06-25-1956 Today's Date: 01/22/2021   History of Present Illness  Pt is s/p R THA with anterior approach on 01/22/21.  Clinical Impression  Pt received in Semi-Fowler's position and agreeable to therapy.  Pt and husband reported that she is still feeling "loopy" upon arrival to room.  Pt notes she has slight feeling in the R LE, but having difficulty moving.  When assessing light touch, her sensation is diminished at this time.  Pt also struggles to perform bed-level exercises, requiring minA-modA for B LE's when performing SLR due to decreased sensation.  Pt reports no pain however and will continue to be monitored throughout further sessions.  Ambulation and mobility deferred due to decreased sensation and pt unstable behavior at this time.  Pt will be re-assessed in the AM and will likely demonstrate increased sensation and mobility function.  Pt will benefit from skilled PT intervention to increase independence and safety with basic mobility in preparation for discharge to the venue listed below.       Follow Up Recommendations Home health PT    Equipment Recommendations  None recommended by PT    Recommendations for Other Services       Precautions / Restrictions Precautions Precautions: Anterior Hip Precaution Booklet Issued: Yes (comment) Restrictions Weight Bearing Restrictions: Yes RLE Weight Bearing: Weight bearing as tolerated      Mobility  Bed Mobility               General bed mobility comments: deferred due to decreased sensation    Transfers                    Ambulation/Gait                Stairs            Wheelchair Mobility    Modified Rankin (Stroke Patients Only)       Balance                                             Pertinent Vitals/Pain Pain Assessment: No/denies pain    Home Living Family/patient expects  to be discharged to:: Private residence Living Arrangements: Spouse/significant other Available Help at Discharge: Family Type of Home: House Home Access: Stairs to enter Entrance Stairs-Rails: Right Entrance Stairs-Number of Steps: 2 Home Layout: One level Home Equipment: Environmental consultant - 2 wheels;Walker - 4 wheels;Toilet riser;Bedside commode      Prior Function Level of Independence: Independent               Hand Dominance   Dominant Hand: Right    Extremity/Trunk Assessment   Upper Extremity Assessment Upper Extremity Assessment: Generalized weakness    Lower Extremity Assessment Lower Extremity Assessment: Generalized weakness    Cervical / Trunk Assessment Cervical / Trunk Assessment: Normal  Communication   Communication: No difficulties  Cognition Arousal/Alertness: Awake/alert Behavior During Therapy: WFL for tasks assessed/performed Overall Cognitive Status: Within Functional Limits for tasks assessed                                        General Comments General comments (skin integrity, edema, etc.): Not assessed.    Exercises Total Joint Exercises Ankle Circles/Pumps:  AROM;Strengthening;Both;10 reps;Supine Quad Sets: AROM;Strengthening;Both;10 reps;Supine Gluteal Sets: AROM;Strengthening;Both;10 reps;Supine Heel Slides: AROM;Strengthening;Both;10 reps;Supine Hip ABduction/ADduction: AROM;Strengthening;Both;10 reps;Supine Straight Leg Raises: AAROM;Strengthening;Both;10 reps;Supine Long Arc Quad: AROM;Strengthening;Both;10 reps;Supine Other Exercises Other Exercises: Pt and husband educated on role of PT and services provided during hospital stay.  Pt also educated on importance of performing exercises in between bouts of exercises in order to increase ROM and decrease atrophy of muscles.   Assessment/Plan    PT Assessment Patient needs continued PT services  PT Problem List Decreased strength;Decreased range of motion;Decreased  activity tolerance;Decreased mobility       PT Treatment Interventions DME instruction;Gait training;Stair training;Functional mobility training;Therapeutic activities;Therapeutic exercise;Balance training;Neuromuscular re-education    PT Goals (Current goals can be found in the Care Plan section)  Acute Rehab PT Goals Patient Stated Goal: To go home. PT Goal Formulation: With patient Time For Goal Achievement: 02/05/21 Potential to Achieve Goals: Good    Frequency BID   Barriers to discharge        Co-evaluation               AM-PAC PT "6 Clicks" Mobility  Outcome Measure Help needed turning from your back to your side while in a flat bed without using bedrails?: A Little Help needed moving from lying on your back to sitting on the side of a flat bed without using bedrails?: A Little Help needed moving to and from a bed to a chair (including a wheelchair)?: A Little Help needed standing up from a chair using your arms (e.g., wheelchair or bedside chair)?: A Little Help needed to walk in hospital room?: A Lot Help needed climbing 3-5 steps with a railing? : A Lot 6 Click Score: 16    End of Session   Activity Tolerance: Patient tolerated treatment well Patient left: in bed;with call bell/phone within reach;with bed alarm set;with family/visitor present Nurse Communication: Mobility status PT Visit Diagnosis: Unsteadiness on feet (R26.81);Other abnormalities of gait and mobility (R26.89);Muscle weakness (generalized) (M62.81);Difficulty in walking, not elsewhere classified (R26.2)    Time: 7124-5809 PT Time Calculation (min) (ACUTE ONLY): 28 min   Charges:   PT Evaluation $PT Eval Low Complexity: 1 Low PT Treatments $Therapeutic Exercise: 23-37 mins        Gwenlyn Saran, PT, DPT 01/22/21, 5:25 PM   Christie Nottingham 01/22/2021, 5:22 PM

## 2021-01-22 NOTE — Op Note (Signed)
01/22/2021  2:23 PM  PATIENT:  Kathryn Hawkins  65 y.o. female  PRE-OPERATIVE DIAGNOSIS:  Primary localized osteoarthritis of right hip M16.11  POST-OPERATIVE DIAGNOSIS:  Primary localized osteoarthritis of right hip M16.11  PROCEDURE:  Procedure(s): TOTAL HIP ARTHROPLASTY ANTERIOR APPROACH (Right)  SURGEON: Laurene Footman, MD  ASSISTANTS: None  ANESTHESIA:   spinal  EBL:  Total I/O In: 1000 [I.V.:1000] Out: 150 [Urine:100; Blood:50]  BLOOD ADMINISTERED:none  DRAINS:  Incisional wound VAC    LOCAL MEDICATIONS USED:  MARCAINE   , XYLOCAINE , and OTHER Exparel  SPECIMEN: Right femoral head  DISPOSITION OF SPECIMEN:  PATHOLOGY  COUNTS:  YES  TOURNIQUET:  * No tourniquets in log *  IMPLANTS: Medacta AMIS 3 standard stem, 52 mm Mpact TM cup and liner with ceramic M 28 mm head  DICTATION: .Dragon Dictation   The patient was brought to the operating room and after spinal anesthesia was obtained patient was placed on the operative table with the ipsilateral foot into the Medacta attachment, contralateral leg on a well-padded table. C-arm was brought in and preop template x-ray taken. After prepping and draping in usual sterile fashion appropriate patient identification and timeout procedures were completed. Anterior approach to the hip was obtained and centered over the greater trochanter and TFL muscle.  10 cc of 1% Xylocaine Willert used at the proximal end of the incision for additional local anesthetic.  The subcutaneous tissue was incised hemostasis being achieved by electrocautery. TFL fascia was incised and the muscle retracted laterally deep retractor placed. The lateral femoral circumflex vessels were identified and ligated. The anterior capsule was exposed and a capsulotomy performed. The neck was identified and a femoral neck cut carried out with a saw. The head was removed without difficulty and showed sclerotic femoral head and acetabulum. Reaming was carried out to 52 mm  and a 52 mm cup trial gave appropriate tightness to the acetabular component a 52 DM cup was impacted into position. The leg was then externally rotated and ischiofemoral and pubofemoral releases carried out. The femur was sequentially broached to a size 3, size 3 standard with M head trials were placed and the final components chosen. The 3 standard stem was inserted along with a ceramic M 28 mm head and 52 mm liner. The hip was reduced and was stable the wound was thoroughly irrigated with fibrillar placed along the posterior capsule and medial neck. The deep fascia ws closed using a heavy Quill after infiltration of 30 cc of quarter percent Sensorcaine with epinephrine.3-0 V-loc to close the skin with skin staples. Xeroform 4 x 4's ABDs and tape patient was sent to recovery in stable condition.   PLAN OF CARE: Admit for overnight observation

## 2021-01-22 NOTE — Transfer of Care (Signed)
Immediate Anesthesia Transfer of Care Note  Patient: Kathryn Hawkins  Procedure(s) Performed: TOTAL HIP ARTHROPLASTY ANTERIOR APPROACH (Right: Hip)  Patient Location: PACU  Anesthesia Type:Spinal  Level of Consciousness: awake  Airway & Oxygen Therapy: Patient Spontanous Breathing  Post-op Assessment: Report given to RN and Post -op Vital signs reviewed and stable  Post vital signs: stable  Last Vitals:  Vitals Value Taken Time  BP 90/58 01/22/21 1416  Temp    Pulse 67 01/22/21 1419  Resp 15 01/22/21 1419  SpO2 100 % 01/22/21 1419  Vitals shown include unvalidated device data.  Last Pain:  Vitals:   01/22/21 0924  PainSc: 3          Complications: No notable events documented.

## 2021-01-23 ENCOUNTER — Encounter: Payer: Self-pay | Admitting: Orthopedic Surgery

## 2021-01-23 DIAGNOSIS — K648 Other hemorrhoids: Secondary | ICD-10-CM | POA: Diagnosis present

## 2021-01-23 DIAGNOSIS — M48061 Spinal stenosis, lumbar region without neurogenic claudication: Secondary | ICD-10-CM | POA: Diagnosis present

## 2021-01-23 DIAGNOSIS — M5136 Other intervertebral disc degeneration, lumbar region: Secondary | ICD-10-CM | POA: Diagnosis present

## 2021-01-23 DIAGNOSIS — K5792 Diverticulitis of intestine, part unspecified, without perforation or abscess without bleeding: Secondary | ICD-10-CM | POA: Diagnosis present

## 2021-01-23 DIAGNOSIS — Z825 Family history of asthma and other chronic lower respiratory diseases: Secondary | ICD-10-CM | POA: Diagnosis not present

## 2021-01-23 DIAGNOSIS — Z801 Family history of malignant neoplasm of trachea, bronchus and lung: Secondary | ICD-10-CM | POA: Diagnosis not present

## 2021-01-23 DIAGNOSIS — M5020 Other cervical disc displacement, unspecified cervical region: Secondary | ICD-10-CM | POA: Diagnosis present

## 2021-01-23 DIAGNOSIS — E559 Vitamin D deficiency, unspecified: Secondary | ICD-10-CM | POA: Diagnosis present

## 2021-01-23 DIAGNOSIS — Z8349 Family history of other endocrine, nutritional and metabolic diseases: Secondary | ICD-10-CM | POA: Diagnosis not present

## 2021-01-23 DIAGNOSIS — Z803 Family history of malignant neoplasm of breast: Secondary | ICD-10-CM | POA: Diagnosis not present

## 2021-01-23 DIAGNOSIS — M8580 Other specified disorders of bone density and structure, unspecified site: Secondary | ICD-10-CM | POA: Diagnosis present

## 2021-01-23 DIAGNOSIS — Z79899 Other long term (current) drug therapy: Secondary | ICD-10-CM | POA: Diagnosis not present

## 2021-01-23 DIAGNOSIS — Z8262 Family history of osteoporosis: Secondary | ICD-10-CM | POA: Diagnosis not present

## 2021-01-23 DIAGNOSIS — Z85820 Personal history of malignant melanoma of skin: Secondary | ICD-10-CM | POA: Diagnosis not present

## 2021-01-23 DIAGNOSIS — Z90710 Acquired absence of both cervix and uterus: Secondary | ICD-10-CM | POA: Diagnosis not present

## 2021-01-23 DIAGNOSIS — M1611 Unilateral primary osteoarthritis, right hip: Secondary | ICD-10-CM | POA: Diagnosis present

## 2021-01-23 DIAGNOSIS — G8929 Other chronic pain: Secondary | ICD-10-CM | POA: Diagnosis present

## 2021-01-23 LAB — CBC
HCT: 31.9 % — ABNORMAL LOW (ref 36.0–46.0)
Hemoglobin: 10.8 g/dL — ABNORMAL LOW (ref 12.0–15.0)
MCH: 33.2 pg (ref 26.0–34.0)
MCHC: 33.9 g/dL (ref 30.0–36.0)
MCV: 98.2 fL (ref 80.0–100.0)
Platelets: 239 10*3/uL (ref 150–400)
RBC: 3.25 MIL/uL — ABNORMAL LOW (ref 3.87–5.11)
RDW: 12.1 % (ref 11.5–15.5)
WBC: 11.5 10*3/uL — ABNORMAL HIGH (ref 4.0–10.5)
nRBC: 0 % (ref 0.0–0.2)

## 2021-01-23 LAB — BASIC METABOLIC PANEL
Anion gap: 1 — ABNORMAL LOW (ref 5–15)
BUN: 12 mg/dL (ref 8–23)
CO2: 28 mmol/L (ref 22–32)
Calcium: 8 mg/dL — ABNORMAL LOW (ref 8.9–10.3)
Chloride: 106 mmol/L (ref 98–111)
Creatinine, Ser: 0.56 mg/dL (ref 0.44–1.00)
GFR, Estimated: >60 mL/min (ref >60)
Glucose, Bld: 128 mg/dL — ABNORMAL HIGH (ref 70–99)
Potassium: 3.8 mmol/L (ref 3.5–5.1)
Sodium: 135 mmol/L (ref 135–145)

## 2021-01-23 MED ORDER — HYDROCODONE-ACETAMINOPHEN 5-325 MG PO TABS: 1.0000 | ORAL_TABLET | ORAL | 0 refills | Status: DC | PRN

## 2021-01-23 MED ORDER — ENOXAPARIN SODIUM 40 MG/0.4ML IJ SOSY: 40.0000 mg | PREFILLED_SYRINGE | INTRAMUSCULAR | 0 refills | Status: DC

## 2021-01-23 MED ORDER — DOCUSATE SODIUM 100 MG PO CAPS: 100.0000 mg | ORAL_CAPSULE | Freq: Two times a day (BID) | ORAL | 0 refills | Status: DC

## 2021-01-23 MED ORDER — TRAMADOL HCL 50 MG PO TABS: 50.0000 mg | ORAL_TABLET | Freq: Four times a day (QID) | ORAL | 0 refills | Status: DC

## 2021-01-23 NOTE — Progress Notes (Signed)
Physical Therapy Treatment Patient Details Name: Kathryn Hawkins MRN: [id number] DOB: 06-17-1956 Today's Date: 01/23/2021    History of Present Illness Pt is s/p R THA with anterior approach on 01/22/21.    PT Comments    Pt was pleasant and motivated to participate during the session.  Recent seated BP noted to be 82/50. Standing BP taken at 77/56, asymptomatic.  Pt then performed standing marches and amb forward/backward near chair for safety x 3 min with standing BP retaken at 96/60, again asymptomatic throughout the session.  Pt was steady with amb which was progressed to the hall and easily ambulated 150 feet with min cues for sequencing.  Will progress pt to stair training next session as appropriate. Pt will benefit from HHPT upon discharge to safely address deficits listed in patient problem list for decreased caregiver assistance and eventual return to PLOF.     Follow Up Recommendations  Home health PT;Supervision - Intermittent     Equipment Recommendations  None recommended by PT    Recommendations for Other Services       Precautions / Restrictions Precautions Precautions: Anterior Hip Precaution Booklet Issued: Yes (comment) Restrictions Weight Bearing Restrictions: Yes RLE Weight Bearing: Weight bearing as tolerated    Mobility  Bed Mobility Overal bed mobility: Modified Independent             General bed mobility comments: Extra time and effort only    Transfers Overall transfer level: Needs assistance Equipment used: Rolling walker (2 wheeled) Transfers: Sit to/from Stand Sit to Stand: Min guard         General transfer comment: Good eccentric and concentric control  Ambulation/Gait Ambulation/Gait assistance: Min guard Gait Distance (Feet): 150 Feet Assistive device: Rolling walker (2 wheeled) Gait Pattern/deviations: Step-through pattern;Antalgic;Decreased step length - right;Decreased step length - left Gait velocity: decreased    General Gait Details: Mildly antalgic on the RLE but steady with good control and stability with min verbal cues for general safety with the RW   Stairs             Wheelchair Mobility    Modified Rankin (Stroke Patients Only)       Balance Overall balance assessment: Needs assistance   Sitting balance-Leahy Scale: Normal     Standing balance support: Bilateral upper extremity supported;During functional activity Standing balance-Leahy Scale: Good                              Cognition Arousal/Alertness: Awake/alert Behavior During Therapy: WFL for tasks assessed/performed Overall Cognitive Status: Within Functional Limits for tasks assessed                                        Exercises Total Joint Exercises Ankle Circles/Pumps: AROM;Strengthening;Both;10 reps Towel Squeeze: Strengthening;Both;10 reps Long Arc Quad: AROM;Strengthening;Both;15 reps;10 reps Knee Flexion: AROM;Strengthening;Both;10 reps;15 reps Marching in Standing: AROM;Strengthening;Both;10 reps;Standing Other Exercises Other Exercises: HEP review per handout    General Comments        Pertinent Vitals/Pain Pain Assessment: 0-10 Pain Score: 5  Pain Location: R hip Pain Descriptors / Indicators: Sore Pain Intervention(s): Monitored during session;Premedicated before session    Home Living                      Prior Function  PT Goals (current goals can now be found in the care plan section) Progress towards PT goals: Progressing toward goals    Frequency    BID      PT Plan Current plan remains appropriate    Co-evaluation              AM-PAC PT "6 Clicks" Mobility   Outcome Measure  Help needed turning from your back to your side while in a flat bed without using bedrails?: A Little Help needed moving from lying on your back to sitting on the side of a flat bed without using bedrails?: A Little Help needed moving  to and from a bed to a chair (including a wheelchair)?: A Little Help needed standing up from a chair using your arms (e.g., wheelchair or bedside chair)?: A Little Help needed to walk in hospital room?: A Little Help needed climbing 3-5 steps with a railing? : A Little 6 Click Score: 18    End of Session Equipment Utilized During Treatment: Gait belt Activity Tolerance: Patient tolerated treatment well Patient left: in bed;with call bell/phone within reach;with bed alarm set;with family/visitor present;with SCD's reapplied Nurse Communication: Mobility status PT Visit Diagnosis: Unsteadiness on feet (R26.81);Other abnormalities of gait and mobility (R26.89);Muscle weakness (generalized) (M62.81);Difficulty in walking, not elsewhere classified (R26.2)     Time: 2633-3545 PT Time Calculation (min) (ACUTE ONLY): 38 min  Charges:  $Gait Training: 8-22 mins $Therapeutic Exercise: 8-22 mins $Therapeutic Activity: 8-22 mins                     D. Scott Carlissa Pesola PT, DPT 01/23/21, 1:07 PM

## 2021-01-23 NOTE — Progress Notes (Signed)
   Subjective: 1 Day Post-Op Procedure(s) (LRB): TOTAL HIP ARTHROPLASTY ANTERIOR APPROACH (Right) Patient reports pain as mild.   Patient is well, and has had no acute complaints or problems Denies any CP, SOB, ABD pain. We will continue therapy today.  Plan is to go Home after hospital stay.  Objective: Vital signs in last 24 hours: Temp:  [97.2 F (36.2 C)-98.4 F (36.9 C)] 98.3 F (36.8 C) (06/22 2707) Pulse Rate:  [67-93] 82 (06/22 0632) Resp:  [11-18] 16 (06/22 8675) BP: (88-109)/(57-73) 93/57 (06/22 4492) SpO2:  [94 %-100 %] 94 % (06/22 0100) Weight:  [57.6 kg] 57.6 kg (06/21 1654)  Intake/Output from previous day: 06/21 0701 - 06/22 0700 In: 1450 [P.O.:50; I.V.:1300; IV Piggyback:100] Out: 1360 [Urine:1310; Blood:50] Intake/Output this shift: No intake/output data recorded.  Recent Labs    01/22/21 1635 01/23/21 0536  HGB 12.7 10.8*   Recent Labs    01/22/21 1635 01/23/21 0536  WBC 12.3* 11.5*  RBC 3.81* 3.25*  HCT 36.9 31.9*  PLT 272 239   Recent Labs    01/22/21 1635 01/23/21 0536  NA  --  135  K  --  3.8  CL  --  106  CO2  --  28  BUN  --  12  CREATININE 0.54 0.56  GLUCOSE  --  128*  CALCIUM  --  8.0*   No results for input(s): LABPT, INR in the last 72 hours.  EXAM General - Patient is Alert, Appropriate, and Oriented Extremity - Neurovascular intact Sensation intact distally Intact pulses distally Dorsiflexion/Plantar flexion intact Dressing - dressing C/D/I and no drainage, provena intact with out drainage Motor Function - intact, moving foot and toes well on exam.   Past Medical History:  Diagnosis Date   DDD (degenerative disc disease), lumbar 2017   Diverticulitis    Dyspareunia in female    Herniated disc, cervical    Lumbar spinal stenosis    Melanoma ([place]) 1994   Left leg. Level II, per patient   Osteopenia 2016   Vaginal dryness    Vulvar atrophy     Assessment/Plan:   1 Day Post-Op Procedure(s) (LRB): TOTAL HIP  ARTHROPLASTY ANTERIOR APPROACH (Right) Active Problems:   Status post total hip replacement, right  Estimated body mass index is 21.13 kg/m as calculated from the following:   Height as of this encounter: 5\' 5"  (1.651 m).   Weight as of this encounter: 57.6 kg. Advance diet Up with therapy Pain well controlled Work on bowel movement Labs are stable Vital signs are stable.  Blood pressure is soft but this is essentially her baseline blood pressure.  She is asymptomatic.  We will monitor vitals/symptoms with PT Care manager to assist with discharge.  Possible discharge home today with home health PT pending progress with PT  DVT Prophylaxis - Lovenox, Foot Pumps, and TED hose Weight-Bearing as tolerated to right leg   T. Rachelle Hora, PA-C [facility] 01/23/2021, 8:04 AM

## 2021-01-23 NOTE — Progress Notes (Signed)
Met with the patient to discuss DC plan and needs She is living at home with her spouse She has a 3 in 1 and a rolling walker at home Her husband provides transportation She can afford her medications She is set up with [facility] from the physicians office prior to surgery No additional needs

## 2021-01-23 NOTE — Evaluation (Signed)
Occupational Therapy Evaluation Patient Details Name: Kathryn Hawkins MRN: [id number] DOB: [birth year] Today's Date: 01/23/2021    History of Present Illness Pt is s/p R THA with anterior approach on 01/22/21.   Clinical Impression   Ms. Husk was seen for OT evaluation this date, POD#1 from above surgery. Pt was independent in all ADLs prior to surgery, however she reports self-limiting activity 2/2 R hip pain. Pt reports she just retired and is eager to return to more physical leisure occupations including playing pickle ball and hiking with her husband with less pain and improved safety and independence. Pt currently requires supervision assist for LB dressing and bathing while in seated position due to pain and limited AROM of R hip. Pt instructed in self care skills, falls prevention strategies, home/routines modifications, DME/AE for LB bathing and dressing tasks, and compression stocking mgt strategies. Pt would benefit from additional instruction in self care skills and techniques to help maintain precautions with or without assistive devices to support recall and carryover prior to discharge. Do not anticipate the need for follow up OT services upon hospital DC.     Follow Up Recommendations  No OT follow up    Equipment Recommendations  Other (comment) (Long Handle Reacher/sock Aid.)    Recommendations for Other Services       Precautions / Restrictions Precautions Precautions: Anterior Hip Precaution Booklet Issued: Yes (comment) Restrictions Weight Bearing Restrictions: Yes RLE Weight Bearing: Weight bearing as tolerated      Mobility Bed Mobility Overal bed mobility: Modified Independent             General bed mobility comments: Extra time and effort only    Transfers Overall transfer level: Needs assistance Equipment used: Rolling walker (2 wheeled) Transfers: Sit to/from Stand Sit to Stand: Supervision         General transfer comment: Good eccentric  and concentric control    Balance Overall balance assessment: Needs assistance   Sitting balance-Leahy Scale: Normal Sitting balance - Comments: Steady static/dynamic sitting, reaching outside BOS.   Standing balance support: During functional activity;No upper extremity supported Standing balance-Leahy Scale: Good Standing balance comment: Steady static standing at sink to wash hands.                           ADL either performed or assessed with clinical judgement   ADL Overall ADL's : Needs assistance/impaired                                       General ADL Comments: Supervision for functional mobility, toileting, LB dressing, and bathing. Min cueing for safe use of AE after demonstration.     Vision Baseline Vision/History: Wears glasses Wears Glasses: At all times Patient Visual Report: No change from baseline       Perception     Praxis      Pertinent Vitals/Pain Pain Assessment: 0-10 Pain Score: 7  Pain Location: R hip Pain Descriptors / Indicators: Sore;Aching Pain Intervention(s): Monitored during session;Repositioned;RN gave pain meds during session     Hand Dominance Right   Extremity/Trunk Assessment Upper Extremity Assessment Upper Extremity Assessment: Overall WFL for tasks assessed   Lower Extremity Assessment Lower Extremity Assessment: RLE deficits/detail RLE Deficits / Details: s/p R Hip IM nail   Cervical / Trunk Assessment Cervical / Trunk Assessment: Normal   Communication Communication Communication:  No difficulties   Cognition Arousal/Alertness: Awake/alert Behavior During Therapy: WFL for tasks assessed/performed Overall Cognitive Status: Within Functional Limits for tasks assessed                                     General Comments       Exercises Total Joint Exercises Ankle Circles/Pumps: AROM;Strengthening;Both;10 reps Towel Squeeze: Strengthening;Both;10 reps Long Arc Quad:  AROM;Strengthening;Both;15 reps;10 reps Knee Flexion: AROM;Strengthening;Both;10 reps;15 reps Marching in Standing: AROM;Strengthening;Both;10 reps;Standing Other Exercises Other Exercises: Pt educated on role of OT in acute setting, safe use of AE/DME for ADL management, anterior hip precautions, and routines modifications to support safety and functional indep. upon hospital DC. Handout provided. Other Exercises: OT facilitates bed/functional mobility, toilet transfer, toileting, LB dressing task. Pt requires supervision assist t/o session.   Shoulder Instructions      Home Living Family/patient expects to be discharged to:: Private residence Living Arrangements: Spouse/significant other Available Help at Discharge: Family Type of Home: House Home Access: Stairs to enter Technical brewer of Steps: 2 Entrance Stairs-Rails: Right Home Layout: One level     Bathroom Shower/Tub: Occupational psychologist: Frederic: Environmental consultant - 2 wheels;Walker - 4 wheels;Toilet riser;Bedside commode          Prior Functioning/Environment Level of Independence: Independent        Comments: Pt reports she was totally independent for ADL/IADL management. +driving, recently retired from position at [employer]. Eager to get back to previous activity including playing pickle ball and hiking.        OT Problem List: Decreased strength;Decreased coordination;Pain;Decreased range of motion;Impaired balance (sitting and/or standing);Decreased knowledge of use of DME or AE;Decreased knowledge of precautions      OT Treatment/Interventions: Self-care/ADL training;Therapeutic exercise;Therapeutic activities;DME and/or AE instruction;Patient/family education;Balance training    OT Goals(Current goals can be found in the care plan section) Acute Rehab OT Goals Patient Stated Goal: To go home. OT Goal Formulation: With patient Time For Goal Achievement: 02/06/21 Potential to  Achieve Goals: Good ADL Goals Pt Will Perform Grooming: with modified independence;standing;with adaptive equipment Pt Will Perform Lower Body Dressing: sit to/from stand;with modified independence;with adaptive equipment Pt Will Transfer to Toilet: bedside commode;with modified independence  OT Frequency: Min 1X/week   Barriers to D/C:            Co-evaluation              AM-PAC OT "6 Clicks" Daily Activity     Outcome Measure Help from another person eating meals?: None Help from another person taking care of personal grooming?: None Help from another person toileting, which includes using toliet, bedpan, or urinal?: A Little Help from another person bathing (including washing, rinsing, drying)?: A Little Help from another person to put on and taking off regular upper body clothing?: None Help from another person to put on and taking off regular lower body clothing?: A Little 6 Click Score: 21   End of Session Equipment Utilized During Treatment: Gait belt;Rolling walker  Activity Tolerance: Patient tolerated treatment well Patient left: in bed;with bed alarm set;with SCD's reapplied;with call bell/phone within reach  OT Visit Diagnosis: Other abnormalities of gait and mobility (R26.89);Pain Pain - Right/Left: Right Pain - part of body: Hip                Time: 1245-1335 OT Time Calculation (min): 50 min Charges:  OT General Charges $OT Visit: 1 Visit OT Evaluation $OT Eval Low Complexity: 1 Low OT Treatments $Self Care/Home Management : 38-52 mins  Shara Blazing, M.S., OTR/L As[employer]: [phone] 01/23/21, 1:55 PM

## 2021-01-23 NOTE — Discharge Summary (Signed)
Physician Discharge Summary  Patient ID: Kathryn Hawkins MRN: [id number] DOB/AGE: Oct 23, 1955 65 y.o.  Admit date: 01/22/2021 Discharge date: 01/23/2021  Admission Diagnoses:  Status post total hip replacement, right [Z96.641]   Discharge Diagnoses: Patient Active Problem List   Diagnosis Date Noted   Status post total hip replacement, right 01/22/2021   Right hip pain 01/27/2018   Lumbar spinal stenosis 01/27/2018   Gluteal tendinitis, right hip 01/27/2018   Calculus of gallbladder with acute cholecystitis without obstruction 08/04/2017   Osteopenia 05/18/2015   METATARSALGIA 08/14/2008   PLANTAR FASCIITIS, RIGHT 08/14/2008   FOOT PAIN, RIGHT 08/14/2008    Past Medical History:  Diagnosis Date   DDD (degenerative disc disease), lumbar 2017   Diverticulitis    Dyspareunia in female    Herniated disc, cervical    Lumbar spinal stenosis    Melanoma ([place]) 1994   Left leg. Level II, per patient   Osteopenia 2016   Vaginal dryness    Vulvar atrophy      Transfusion: None   Consultants (if any):   Discharged Condition: Improved  Hospital Course: JULIETA ROGALSKI is an 65 y.o. female who was admitted 01/22/2021 with a diagnosis of right hip osteoarthritis and went to the operating room on 01/22/2021 and underwent the above named procedures.    Surgeries: Procedure(s): TOTAL HIP ARTHROPLASTY ANTERIOR APPROACH on 01/22/2021 Patient tolerated the surgery well. Taken to PACU where she was stabilized and then transferred to the orthopedic floor.  Started on Lovenox 40 mg q 24 hrs. Foot pumps applied bilaterally at 80 mm. Heels elevated on bed with rolled towels. No evidence of DVT. Negative Homan. Physical therapy started on day #1 for gait training and transfer. OT started day #1 for ADL and assisted devices.  Patient's foley was d/c on day #1. Patient's IV was d/c on day #1. On post op day #1patient was stable and ready for discharge to home with home health PT.  Implants:  Medacta AMIS 3 standard stem, 52 mm Mpact TM cup and liner with ceramic M 28 mm head  She was given perioperative antibiotics:  Anti-infectives (From admission, onward)    Start     Dose/Rate Route Frequency Ordered Stop   01/22/21 1900  ceFAZolin (ANCEF) IVPB 1 g/50 mL premix        1 g 100 mL/hr over 30 Minutes Intravenous Every 6 hours 01/22/21 1555 01/23/21 0120   01/22/21 0920  ceFAZolin (ANCEF) 2-4 GM/100ML-% IVPB       Note to Pharmacy: Trudie Reed   : cabinet override      01/22/21 0920 01/22/21 1306   01/22/21 0600  ceFAZolin (ANCEF) IVPB 2g/100 mL premix        2 g 200 mL/hr over 30 Minutes Intravenous On call to O.R. 01/22/21 0159 01/22/21 1305     .  She was given sequential compression devices, early ambulation, and Lovenox, teds for DVT prophylaxis.  She benefited maximally from the hospital stay and there were no complications.    Recent vital signs:  Vitals:   01/23/21 0632 01/23/21 0805  BP: (!) 93/57 (!) 82/50  Pulse: 82 76  Resp: 16 18  Temp: 98.3 F (36.8 C) 98.4 F (36.9 C)  SpO2: 94% 96%    Recent laboratory studies:  Lab Results  Component Value Date   HGB 10.8 (L) 01/23/2021   HGB 12.7 01/22/2021   HGB 12.9 01/14/2021   Lab Results  Component Value Date   WBC 11.5 (H)  01/23/2021   PLT 239 01/23/2021   No results found for: INR Lab Results  Component Value Date   NA 135 01/23/2021   K 3.8 01/23/2021   CL 106 01/23/2021   CO2 28 01/23/2021   BUN 12 01/23/2021   CREATININE 0.56 01/23/2021   GLUCOSE 128 (H) 01/23/2021    Discharge Medications:   Allergies as of 01/23/2021       Reactions   Cobalt    Allergy test    Nickel    Allergy test    Penicillins Other (See Comments)   Yeast Infection   Thimerosal    Allergy test        Medication List     STOP taking these medications    etodolac 400 MG tablet Commonly known as: LODINE       TAKE these medications    buPROPion 150 MG 12 hr tablet Commonly known as:  WELLBUTRIN SR Take 1 tablet (150 mg total) by mouth daily. What changed: Another medication with the same name was removed. Continue taking this medication, and follow the directions you see here.   CALCIUM-MAGNESIUM-VITAMIN D PO Take 3 tablets by mouth daily.   cyclobenzaprine 5 MG tablet Commonly known as: FLEXERIL Take 5 mg by mouth 3 (three) times daily as needed for muscle spasms.   docusate sodium 100 MG capsule Commonly known as: COLACE Take 1 capsule (100 mg total) by mouth 2 (two) times daily.   enoxaparin 40 MG/0.4ML injection Commonly known as: LOVENOX Inject 0.4 mLs (40 mg total) into the skin daily for 14 days.   GLUCOSAMINE CHONDR 1500 COMPLX PO Take 2 tablets by mouth daily.   HYDROcodone-acetaminophen 5-325 MG tablet Commonly known as: NORCO/VICODIN Take 1-2 tablets by mouth every 4 (four) hours as needed for moderate pain (pain score 4-6).   ketoconazole 2 % cream Commonly known as: NIZORAL Apply 1 application topically daily as needed for irritation.   multivitamin with minerals tablet Take 1 tablet by mouth daily.   PROBIOTIC DAILY PO Take 1 capsule by mouth daily. 50 Billion   traMADol 50 MG tablet Commonly known as: ULTRAM Take 1 tablet (50 mg total) by mouth every 6 (six) hours.       ASK your doctor about these medications    estradiol 0.1 MG/GM vaginal cream Commonly known as: ESTRACE VAGINAL Place 1 Applicatorful vaginally at bedtime. Apply 1 gm per vagina every night for 2 weeks, then apply three times weekly.   estradiol 0.5 MG tablet Commonly known as: ESTRACE TAKE ONE TABLET BY MOUTH EVERY DAY for 21 days then take none for 7 days   progesterone 200 MG capsule Commonly known as: PROMETRIUM TAKE 1 CAPSULE EVERY DAY               Durable Medical Equipment  (From admission, onward)           Start     Ordered   01/22/21 1556  DME Walker rolling  Once       Question Answer Comment  Walker: With 5 Inch Wheels    Patient needs a walker to treat with the following condition Status post total hip replacement, right      01/22/21 1555   01/22/21 1556  DME 3 n 1  Once        01/22/21 1555   01/22/21 1556  DME Bedside commode  Once       Question:  Patient needs a bedside commode to treat with the  following condition  Answer:  Status post total hip replacement, right   01/22/21 1555            Diagnostic Studies: DG HIP OPERATIVE UNILAT W OR W/O PELVIS RIGHT  Result Date: 01/22/2021 CLINICAL DATA:  Right hip replacement. EXAM: OPERATIVE RIGHT HIP (WITH PELVIS IF PERFORMED) TECHNIQUE: Fluoroscopic spot image(s) were submitted for interpretation post-operatively. COMPARISON:  None. FINDINGS: Three fluoroscopic spot views of the right hip in frontal projection submitted during right hip arthroplasty. Fluoroscopy time 12 seconds. IMPRESSION: Fluoroscopic spot views during right hip arthroplasty. Electronically Signed   By: Keith Rake M.D.   On: 01/22/2021 15:07   DG HIP UNILAT W OR W/O PELVIS 2-3 VIEWS RIGHT  Result Date: 01/22/2021 CLINICAL DATA:  Post right hip replacement. EXAM: DG HIP (WITH OR WITHOUT PELVIS) 2-3V RIGHT COMPARISON:  None. FINDINGS: Right hip arthroplasty in expected alignment. No periprosthetic lucency or fracture. Recent postsurgical change includes air and edema in the joint space and soft tissues. Wound VAC and skin staples in place. IMPRESSION: Right hip arthroplasty without immediate postoperative complication. Electronically Signed   By: Keith Rake M.D.   On: 01/22/2021 15:11    Disposition:      Follow-up Information     Duanne Guess, PA-C Follow up in 2 week(s).   Specialties: Orthopedic Surgery, Emergency Medicine Contact information: [place] [address] [phone]                  Signed: Feliberto Gottron 01/23/2021, 8:11 AM

## 2021-01-23 NOTE — Anesthesia Postprocedure Evaluation (Signed)
Anesthesia Post Note  Patient: Kathryn Hawkins  Procedure(s) Performed: TOTAL HIP ARTHROPLASTY ANTERIOR APPROACH (Right: Hip)  Patient location during evaluation: Nursing Unit Anesthesia Type: Spinal Level of consciousness: awake and alert and oriented Pain management: pain level controlled Vital Signs Assessment: post-procedure vital signs reviewed and stable Respiratory status: respiratory function stable Cardiovascular status: stable Postop Assessment: no headache, no backache, patient able to bend at knees, no apparent nausea or vomiting, able to ambulate and adequate PO intake Anesthetic complications: no   No notable events documented.   Last Vitals:  Vitals:   01/22/21 2015 01/23/21 0632  BP: 106/69 (!) 93/57  Pulse: 75 82  Resp: 16 16  Temp: 36.9 C 36.8 C  SpO2: 100% 94%    Last Pain:  Vitals:   01/23/21 0337  TempSrc:   PainSc: 8                  Lanora Manis

## 2021-01-23 NOTE — Progress Notes (Signed)
Physical Therapy Treatment Patient Details Name: Kathryn Hawkins MRN: [id number] DOB: Aug 15, 1955 Today's Date: 01/23/2021    History of Present Illness Pt is s/p R THA with anterior approach on 01/22/21.    PT Comments    Pt was pleasant and motivated to participate during the session and put forth very good effort throughout.  Pt was steady with amb with occasional min cues for general sequencing and demonstrated good stability and safety during stair training.  Pt's seated BP was 93/56 this session with no adverse symptoms reported by the pt other than R hip pain.  Pt will benefit from HHPT upon discharge to safely address deficits listed in patient problem list for decreased caregiver assistance and eventual return to PLOF.     Follow Up Recommendations  Home health PT;Supervision - Intermittent     Equipment Recommendations  None recommended by PT    Recommendations for Other Services       Precautions / Restrictions Precautions Precautions: Anterior Hip Precaution Booklet Issued: Yes (comment) Restrictions Weight Bearing Restrictions: Yes RLE Weight Bearing: Weight bearing as tolerated    Mobility  Bed Mobility Overal bed mobility: Modified Independent             General bed mobility comments: Extra time and effort only    Transfers Overall transfer level: Needs assistance Equipment used: Rolling walker (2 wheeled) Transfers: Sit to/from Stand Sit to Stand: Supervision         General transfer comment: Good eccentric and concentric control  Ambulation/Gait Ambulation/Gait assistance: Min guard Gait Distance (Feet): 200 Feet Assistive device: Rolling walker (2 wheeled) Gait Pattern/deviations: Step-through pattern;Antalgic;Decreased step length - right;Decreased step length - left Gait velocity: decreased   General Gait Details: Mildly to moderately antalgic on the RLE but steady with good control and stability with min verbal cues for general safety  with the RW   Stairs Stairs: Yes Stairs assistance: Min guard Stair Management: One rail Right;Forwards;Backwards;Step to pattern;With walker Number of Stairs: 4 General stair comments: Stair training with pt and spouse including ascending/descending 4 steps with R rail and one step with a RW; pt demonstrated good control, stability, and carryover of proper sequencing   Wheelchair Mobility    Modified Rankin (Stroke Patients Only)       Balance Overall balance assessment: Needs assistance   Sitting balance-Leahy Scale: Normal Sitting balance - Comments: Steady static/dynamic sitting, reaching outside BOS.   Standing balance support: During functional activity;Bilateral upper extremity supported Standing balance-Leahy Scale: Good Standing balance comment: Steady static standing at sink to wash hands.                            Cognition Arousal/Alertness: Awake/alert Behavior During Therapy: WFL for tasks assessed/performed Overall Cognitive Status: Within Functional Limits for tasks assessed                                        Exercises Total Joint Exercises Ankle Circles/Pumps: AROM;Strengthening;Both;10 reps Towel Squeeze: Strengthening;Both;10 reps Long Arc Quad: AROM;Strengthening;Both;15 reps;10 reps Knee Flexion: AROM;Strengthening;Both;10 reps;15 reps Marching in Standing: AROM;Strengthening;Both;10 reps;Standing Other Exercises Other Exercises: Car transfer sequencing with pt and spouse verbally and with visual simulation Other Exercises: Stair training with pt and spouse    General Comments        Pertinent Vitals/Pain Pain Assessment: 0-10 Pain Score: 7  Pain  Location: R hip Pain Descriptors / Indicators: Sore;Aching Pain Intervention(s): Premedicated before session;Monitored during session    Home Living Family/patient expects to be discharged to:: Private residence Living Arrangements: Spouse/significant  other Available Help at Discharge: Family Type of Home: House Home Access: Stairs to enter Entrance Stairs-Rails: Right Home Layout: One level Home Equipment: Environmental consultant - 2 wheels;Walker - 4 wheels;Toilet riser;Bedside commode      Prior Function Level of Independence: Independent      Comments: Pt reports she was totally independent for ADL/IADL management. +driving, recently retired from position at [employer]. Eager to get back to previous activity including playing pickle ball and hiking.   PT Goals (current goals can now be found in the care plan section) Acute Rehab PT Goals Patient Stated Goal: To go home. Progress towards PT goals: Progressing toward goals    Frequency    BID      PT Plan Current plan remains appropriate    Co-evaluation              AM-PAC PT "6 Clicks" Mobility   Outcome Measure  Help needed turning from your back to your side while in a flat bed without using bedrails?: A Little Help needed moving from lying on your back to sitting on the side of a flat bed without using bedrails?: A Little Help needed moving to and from a bed to a chair (including a wheelchair)?: A Little Help needed standing up from a chair using your arms (e.g., wheelchair or bedside chair)?: A Little Help needed to walk in hospital room?: A Little Help needed climbing 3-5 steps with a railing? : A Little 6 Click Score: 18    End of Session Equipment Utilized During Treatment: Gait belt Activity Tolerance: Patient tolerated treatment well Patient left: in bed;with call bell/phone within reach;with bed alarm set;with family/visitor present;with SCD's reapplied Nurse Communication: Mobility status PT Visit Diagnosis: Unsteadiness on feet (R26.81);Other abnormalities of gait and mobility (R26.89);Muscle weakness (generalized) (M62.81);Difficulty in walking, not elsewhere classified (R26.2)     Time: 6415-8309 PT Time Calculation (min) (ACUTE ONLY): 41 min  Charges:  $Gait  Training: 23-37 mins $Therapeutic Exercise: 8-22 mins $Therapeutic Activity: 8-22 mins                    D. Scott Rikki Smestad PT, DPT 01/23/21, 4:52 PM

## 2021-01-23 NOTE — Discharge Instructions (Signed)

## 2021-01-23 NOTE — Plan of Care (Signed)
Ready for discharge

## 2021-01-24 DIAGNOSIS — Z471 Aftercare following joint replacement surgery: Secondary | ICD-10-CM | POA: Diagnosis not present

## 2021-01-24 DIAGNOSIS — M5136 Other intervertebral disc degeneration, lumbar region: Secondary | ICD-10-CM | POA: Diagnosis not present

## 2021-01-24 DIAGNOSIS — M5020 Other cervical disc displacement, unspecified cervical region: Secondary | ICD-10-CM | POA: Diagnosis not present

## 2021-01-24 DIAGNOSIS — Z7901 Long term (current) use of anticoagulants: Secondary | ICD-10-CM | POA: Diagnosis not present

## 2021-01-24 DIAGNOSIS — K5792 Diverticulitis of intestine, part unspecified, without perforation or abscess without bleeding: Secondary | ICD-10-CM | POA: Diagnosis not present

## 2021-01-24 DIAGNOSIS — K8000 Calculus of gallbladder with acute cholecystitis without obstruction: Secondary | ICD-10-CM | POA: Diagnosis not present

## 2021-01-24 DIAGNOSIS — Z85820 Personal history of malignant melanoma of skin: Secondary | ICD-10-CM | POA: Diagnosis not present

## 2021-01-24 DIAGNOSIS — M859 Disorder of bone density and structure, unspecified: Secondary | ICD-10-CM | POA: Diagnosis not present

## 2021-01-24 DIAGNOSIS — Z96641 Presence of right artificial hip joint: Secondary | ICD-10-CM | POA: Diagnosis not present

## 2021-01-24 DIAGNOSIS — E559 Vitamin D deficiency, unspecified: Secondary | ICD-10-CM | POA: Diagnosis not present

## 2021-01-24 DIAGNOSIS — M48061 Spinal stenosis, lumbar region without neurogenic claudication: Secondary | ICD-10-CM | POA: Diagnosis not present

## 2021-01-24 LAB — SURGICAL PATHOLOGY

## 2021-01-30 DIAGNOSIS — Z471 Aftercare following joint replacement surgery: Secondary | ICD-10-CM | POA: Diagnosis not present

## 2021-02-13 ENCOUNTER — Other Ambulatory Visit: Payer: Self-pay | Admitting: Internal Medicine

## 2021-02-13 DIAGNOSIS — Z1231 Encounter for screening mammogram for malignant neoplasm of breast: Secondary | ICD-10-CM

## 2021-02-14 ENCOUNTER — Other Ambulatory Visit: Payer: Self-pay | Admitting: Internal Medicine

## 2021-02-14 DIAGNOSIS — N6310 Unspecified lump in the right breast, unspecified quadrant: Secondary | ICD-10-CM

## 2021-02-27 DIAGNOSIS — M25551 Pain in right hip: Secondary | ICD-10-CM | POA: Diagnosis not present

## 2021-03-06 DIAGNOSIS — M25551 Pain in right hip: Secondary | ICD-10-CM | POA: Diagnosis not present

## 2021-03-06 DIAGNOSIS — Z96641 Presence of right artificial hip joint: Secondary | ICD-10-CM | POA: Diagnosis not present

## 2021-03-11 DIAGNOSIS — M25551 Pain in right hip: Secondary | ICD-10-CM | POA: Diagnosis not present

## 2021-03-14 ENCOUNTER — Ambulatory Visit
Admission: RE | Admit: 2021-03-14 | Discharge: 2021-03-14 | Disposition: A | Discharge status: Home / Self Care | Payer: PPO | Source: Ambulatory Visit | Attending: Internal Medicine | Admitting: Internal Medicine

## 2021-03-14 ENCOUNTER — Other Ambulatory Visit: Payer: Self-pay | Admitting: Internal Medicine

## 2021-03-14 ENCOUNTER — Other Ambulatory Visit: Payer: Self-pay

## 2021-03-14 DIAGNOSIS — N6320 Unspecified lump in the left breast, unspecified quadrant: Secondary | ICD-10-CM | POA: Insufficient documentation

## 2021-03-14 DIAGNOSIS — N6311 Unspecified lump in the right breast, upper outer quadrant: Secondary | ICD-10-CM | POA: Diagnosis not present

## 2021-03-14 DIAGNOSIS — N6310 Unspecified lump in the right breast, unspecified quadrant: Secondary | ICD-10-CM

## 2021-03-14 DIAGNOSIS — R922 Inconclusive mammogram: Secondary | ICD-10-CM | POA: Diagnosis not present

## 2021-03-14 DIAGNOSIS — M25551 Pain in right hip: Secondary | ICD-10-CM | POA: Diagnosis not present

## 2021-03-18 ENCOUNTER — Other Ambulatory Visit: Payer: Self-pay | Admitting: Internal Medicine

## 2021-03-18 DIAGNOSIS — R928 Other abnormal and inconclusive findings on diagnostic imaging of breast: Secondary | ICD-10-CM

## 2021-03-18 DIAGNOSIS — N6489 Other specified disorders of breast: Secondary | ICD-10-CM

## 2021-03-18 DIAGNOSIS — N6310 Unspecified lump in the right breast, unspecified quadrant: Secondary | ICD-10-CM

## 2021-03-18 DIAGNOSIS — M25551 Pain in right hip: Secondary | ICD-10-CM | POA: Diagnosis not present

## 2021-03-20 DIAGNOSIS — M25551 Pain in right hip: Secondary | ICD-10-CM | POA: Diagnosis not present

## 2021-03-21 ENCOUNTER — Ambulatory Visit
Admission: RE | Admit: 2021-03-21 | Discharge: 2021-03-21 | Disposition: A | Discharge status: Home / Self Care | Payer: PPO | Source: Ambulatory Visit | Attending: Internal Medicine | Admitting: Internal Medicine

## 2021-03-21 ENCOUNTER — Other Ambulatory Visit: Payer: Self-pay

## 2021-03-21 ENCOUNTER — Other Ambulatory Visit: Payer: Self-pay | Admitting: Diagnostic Radiology

## 2021-03-21 DIAGNOSIS — N6312 Unspecified lump in the right breast, upper inner quadrant: Secondary | ICD-10-CM | POA: Diagnosis not present

## 2021-03-21 DIAGNOSIS — R928 Other abnormal and inconclusive findings on diagnostic imaging of breast: Secondary | ICD-10-CM

## 2021-03-21 DIAGNOSIS — N6489 Other specified disorders of breast: Secondary | ICD-10-CM | POA: Insufficient documentation

## 2021-03-21 DIAGNOSIS — N6310 Unspecified lump in the right breast, unspecified quadrant: Secondary | ICD-10-CM

## 2021-03-21 DIAGNOSIS — Z7689 Persons encountering health services in other specified circumstances: Secondary | ICD-10-CM | POA: Diagnosis not present

## 2021-03-21 HISTORY — PX: BREAST BIOPSY: SHX20

## 2021-03-22 LAB — SURGICAL PATHOLOGY

## 2021-03-25 DIAGNOSIS — M25551 Pain in right hip: Secondary | ICD-10-CM | POA: Diagnosis not present

## 2021-03-28 DIAGNOSIS — M25551 Pain in right hip: Secondary | ICD-10-CM | POA: Diagnosis not present

## 2021-04-03 DIAGNOSIS — M25551 Pain in right hip: Secondary | ICD-10-CM | POA: Diagnosis not present

## 2021-04-09 DIAGNOSIS — M25551 Pain in right hip: Secondary | ICD-10-CM | POA: Diagnosis not present

## 2021-04-12 DIAGNOSIS — M25551 Pain in right hip: Secondary | ICD-10-CM | POA: Diagnosis not present

## 2021-04-15 DIAGNOSIS — M25551 Pain in right hip: Secondary | ICD-10-CM | POA: Diagnosis not present

## 2021-04-18 DIAGNOSIS — M25551 Pain in right hip: Secondary | ICD-10-CM | POA: Diagnosis not present

## 2021-04-22 DIAGNOSIS — R339 Retention of urine, unspecified: Secondary | ICD-10-CM | POA: Diagnosis not present

## 2021-04-22 DIAGNOSIS — N951 Menopausal and female climacteric states: Secondary | ICD-10-CM | POA: Diagnosis not present

## 2021-04-22 DIAGNOSIS — M25551 Pain in right hip: Secondary | ICD-10-CM | POA: Diagnosis not present

## 2021-04-22 DIAGNOSIS — R351 Nocturia: Secondary | ICD-10-CM | POA: Diagnosis not present

## 2021-04-22 DIAGNOSIS — R350 Frequency of micturition: Secondary | ICD-10-CM | POA: Diagnosis not present

## 2021-04-22 DIAGNOSIS — R6882 Decreased libido: Secondary | ICD-10-CM | POA: Diagnosis not present

## 2021-04-22 DIAGNOSIS — N898 Other specified noninflammatory disorders of vagina: Secondary | ICD-10-CM | POA: Diagnosis not present

## 2021-04-25 DIAGNOSIS — M25551 Pain in right hip: Secondary | ICD-10-CM | POA: Diagnosis not present

## 2021-05-03 DIAGNOSIS — M25551 Pain in right hip: Secondary | ICD-10-CM | POA: Diagnosis not present

## 2021-05-10 DIAGNOSIS — M25551 Pain in right hip: Secondary | ICD-10-CM | POA: Diagnosis not present

## 2021-05-17 DIAGNOSIS — M25551 Pain in right hip: Secondary | ICD-10-CM | POA: Diagnosis not present

## 2021-05-24 DIAGNOSIS — M25551 Pain in right hip: Secondary | ICD-10-CM | POA: Diagnosis not present

## 2021-05-30 DIAGNOSIS — R928 Other abnormal and inconclusive findings on diagnostic imaging of breast: Secondary | ICD-10-CM | POA: Diagnosis not present

## 2021-05-30 DIAGNOSIS — N644 Mastodynia: Secondary | ICD-10-CM | POA: Diagnosis not present

## 2021-06-05 ENCOUNTER — Other Ambulatory Visit: Payer: Self-pay

## 2021-06-05 ENCOUNTER — Ambulatory Visit: Payer: PPO

## 2021-06-05 DIAGNOSIS — Z23 Encounter for immunization: Secondary | ICD-10-CM

## 2021-07-04 ENCOUNTER — Ambulatory Visit: Payer: PPO | Attending: Obstetrics and Gynecology | Admitting: Physical Therapy

## 2021-07-04 ENCOUNTER — Other Ambulatory Visit: Payer: Self-pay

## 2021-07-04 ENCOUNTER — Encounter: Payer: Self-pay | Admitting: Physical Therapy

## 2021-07-04 DIAGNOSIS — M533 Sacrococcygeal disorders, not elsewhere classified: Secondary | ICD-10-CM | POA: Insufficient documentation

## 2021-07-04 DIAGNOSIS — R2689 Other abnormalities of gait and mobility: Secondary | ICD-10-CM | POA: Diagnosis not present

## 2021-07-04 DIAGNOSIS — G8929 Other chronic pain: Secondary | ICD-10-CM | POA: Insufficient documentation

## 2021-07-04 DIAGNOSIS — R278 Other lack of coordination: Secondary | ICD-10-CM | POA: Insufficient documentation

## 2021-07-04 DIAGNOSIS — M25561 Pain in right knee: Secondary | ICD-10-CM | POA: Diagnosis not present

## 2021-07-04 DIAGNOSIS — R339 Retention of urine, unspecified: Secondary | ICD-10-CM | POA: Diagnosis not present

## 2021-07-04 DIAGNOSIS — M25562 Pain in left knee: Secondary | ICD-10-CM | POA: Insufficient documentation

## 2021-07-04 NOTE — Patient Instructions (Signed)
  Avoid straining pelvic floor, abdominal muscles , spine  Use log rolling technique instead of getting out of bed with your neck or the sit-up     Log rolling into and out of bed   Log rolling into and out of bed If getting out of bed on R side, Bent knees, scoot hips/ shoulder to L  Raise R arm completely overhead, rolling onto armpit  Then lower bent knees to bed to get into complete side lying position  Then drop legs off bed, and push up onto R elbow/forearm, and use L hand to push onto the bed   __   Proper body mechanics with getting out of a chair to decrease strain  on back &pelvic floor   Avoid holding your breath when Getting out of the chair:  Scoot to front part of chair chair Heels behind knees, feet are hip width apart, nose over toes  Inhale like you are smelling roses Exhale to stand   __  Feet on ground not crossed   __  Increase water from 16 fl oz to 32 fl oz

## 2021-07-04 NOTE — Therapy (Signed)
[place] MAIN [facility] SERVICES [address] [place], [place], [address] Phone: [phone]   Fax:  [phone]  Physical Therapy Evaluation  Patient Details  Name: Kathryn Hawkins MRN: [id number] Date of Birth: 1955/09/05 Referring Provider (PT): Benjaman Kindler   Encounter Date: 07/04/2021   PT End of Session - 07/04/21 1414     Visit Number 1    Number of Visits 10    Date for PT Re-Evaluation 09/12/21    PT Start Time 2263    PT Stop Time 3354    PT Time Calculation (min) 83 min             Past Medical History:  Diagnosis Date   DDD (degenerative disc disease), lumbar 2017   Diverticulitis    Dyspareunia in female    Herniated disc, cervical    Lumbar spinal stenosis    Melanoma ([place]) 1994   Left leg. Level II, per patient   Osteopenia 2016   Vaginal dryness    Vulvar atrophy     Past Surgical History:  Procedure Laterality Date   ABDOMINAL HYSTERECTOMY  1994   with bladder tack   BLADDER SUSPENSION     BREAST BIOPSY Right 03/24/2019   [place] bx coil clip BENIGN BREAST TISSUE WITH PSEUDO-ANGIOMATOUS STROMAL HYPERPLASIA, USUAL DUCTAL HYPERPLASIA AND FOCAL FIBROADENOMATOID CHANGES   BREAST BIOPSY Right 03/21/2021   [place] bx mass, coil marker, path pending   BREAST BIOPSY Left 03/21/2021   stereo bx asymmetry, x marker, path pending   CHOLECYSTECTOMY N/A 08/05/2017   Procedure: LAPAROSCOPIC CHOLECYSTECTOMY;  Surgeon: Vickie Epley, MD;  Location: [facility] ORS;  Service: General;  Laterality: N/A;   COLONOSCOPY     2008, 2018   CYSTOURETHROSCOPY  01/2014   urethrolysis transvaginal   FLEXIBLE SIGMOIDOSCOPY  1994   KELOID EXCISION Right 1974   LAPAROSCOPIC OVARIAN CYSTECTOMY Right 1994   MELANOMA EXCISION Left    leg   TOTAL HIP ARTHROPLASTY Right 01/22/2021   Procedure: TOTAL HIP ARTHROPLASTY ANTERIOR APPROACH;  Surgeon: Hessie Knows, MD;  Location: [facility] ORS;  Service: Orthopedics;  Laterality: Right;   TUBAL LIGATION       There were no vitals filed for this visit.    Subjective Assessment - 07/04/21 1506     Subjective 1) difficulty with completely emptying urine: Pt had a hysterectomy and bladder tack in 2004 after which she had to get cathetherized for almost a month. Since that time,  she experienced difficulty completely emptying urine for 11 years. Pt feels she has to go back to urinate again because she still feels there is still pee.  Pt had no pelvic pain with the bladder tack all these years.   Sometimes she has to strain. Pt had a procedure w/ the bladder sling in 2015. After this correction procedure, pt was able to pee better but overtime, it reverted to the same problem.  After recent R THA in June 2022, she feels the same level of frequency and difficulty emptying . Patient has to sit for prolonged >5 minutes to completely empty and urine and the middle of the night.  Patient gets up in the middle the night 1-2 times a night now compared to 5 x 5 after R THA in June 2022   . Hx of 2 vaginal deliveries.    2) difficulty emptying bowels/ constipation after hsyterectomy:  [place] Stool Type 1-3 across 75 % of the time, Type 4 across 25 % of the  time. Daily water intake 16 fl of water. Pt has to dig with her finger and strain sometimes to finish emptying bowels.    3) LBP started after delivery of 2nd child. Pt also had Hx of falls onto tailbone. Pt is not able to sit on hard nor too soft of surfaces and carries a cushion to sit on hard sbenches with family at restaurants. Pt has disc bulge at L4-5. Radiating numbness down back of legs to foot when sitting for > 30 min.    4) B knee pain after R THA  occurs with squats.  P has a walking 1-2 miles daily.   5) vaginal dryness impacting   sexual intercourse.      Patient Stated Goals completely empty bladder and strengthen her core                [facility] PT Assessment - 07/04/21 1515       Assessment   Medical Diagnosis Retention of urine     Referring Provider (PT) Benjaman Kindler      Balance Screen   Has the patient fallen in the past 6 months No      Observation/Other Assessments   Observations chest breathing, R lower rib lowered than L  in supine position      Squat   Comments poor technique      Sit to Stand   Comments adducted knees      Strength   Overall Strength Comments trunk perturbation with hipflexion seated, BLE 4-/5, pre Tx: L hip abd 2/5, post Tx 4-/5, R hip abd 4-/5      Palpation   Spinal mobility L LBP with R sideflexion with full ROM ( post Tx : no LBP with R sideflexion)    L low rib higher than R in supine,     SI assessment  L iliac crest higher, L SIJ hypomobile at S2-3 , posterior pelvic floor, coccygeus, obt int (post Tx: levelled iliac crest)      Bed Mobility   Bed Mobility --   poor technique straining pelvic floor/ ab     Ambulation/Gait   Gait Comments excessive sway of pelvis L, limited posterior rotation of L pelvis                        Objective measurements completed on examination: See above findings.     Pelvic Floor Special Questions - 07/04/21 1535     Diastasis Recti below umbilicus: less firm compared to above    External Perineal Exam tightness at L ischiocavernosus/ obt int, coccygeus              [facility] Adult PT Treatment/Exercise - 07/04/21 1515       Therapeutic Activites    Other Therapeutic Activities explained anatomy ;physiology and active listening to Hx to apply a customized POC      Neuro Re-ed    Neuro Re-ed Details  cued for body mechanics to minimize straining ab and pelvic floor      Manual Therapy   Manual therapy comments L long distraction, STM/MWM at posterior pelvic floor, and at L SIJ to promote more inferior position of ilia/ ER                          PT Long Term Goals - 07/04/21 1444       PT LONG TERM GOAL #1   Title Pt will  report improved Stool consistency Type 4 across 50% of the time  instead of 25% of the time with increased water intake from,16 fl oz to 32 fl oz. in order to minimize straining her pelvic floor with bowel movements    Time 8    Period Weeks    Status New    Target Date 08/29/21      PT LONG TERM GOAL #2   Title Pt will demo proper deep core mm coordination with proper lengthening of pelvic floor in order to minimize waiting time to completely empty bladder from 5 min to < 1 min    Time 4    Period Weeks    Status New    Target Date 08/01/21      PT LONG TERM GOAL #3   Title Pt will demo proper body mechanics to minimzie straining pelvic floor/ abdomen in order to improve Sx    Time 2    Period Weeks    Status New    Target Date 07/18/21      PT LONG TERM GOAL #4   Title Pt will demo proper coccyx and SIJ mobility and alignment and be able to sit on hard bench without a pillow while eating with family at [place]. for 30 min    Time 6    Period Weeks    Status New    Target Date 08/15/21      PT LONG TERM GOAL #5   Title Pt will demo no more higher L iliac crest  and demo no pain with R sideflexion across 2 visits in order to progress to deep core training    Time 4    Period Weeks    Status New    Target Date 08/01/21      Additional Long Term Goals   Additional Long Term Goals Yes      PT LONG TERM GOAL #6   Title Pt will improve FOTO lumbar score from 60 pts to > 64 pts in order to stand and bend for ADLs    Time 10    Period Weeks    Status New    Target Date 09/12/21      PT LONG TERM GOAL #7   Title Pt wil improved Urinary Problem score on FOTO from 57 pts to > 62 pts and Bowel Constipation from 55 pts to > 60 pts in order to improve QOL    Time 10    Period Weeks    Status New    Target Date 09/12/21      PT LONG TERM GOAL #8   Title Pt will demo proper alignment with squat and report no knee pain in order to perform lifting objects off floor    Time 5    Status New    Target Date 08/08/21                     Plan - 07/04/21 1414     Clinical Impression Statement  Pt is a 65 yo  who presents with incomplete emptying of urine and bowels, CLBP with numbness along back of legs/ foot,  B knee pain, and vaginal dryness. These Sx have impacted her QOL and ADLs. Contributing factors include:  wore catheter after hysterectomy/ bladder sling procedure in 2004 for a  month, R THA 2022, Hx of multiple falls onto tailbone/ buttocks.   Pt's musculoskeletal assessment revealed uneven iliac crest height ( L higher), reproduction of LBP  with R sideflexion,  dyscoordination and strength of pelvic floor mm, tightness of pelvic floor mm and hypomobility of L SIJ,  weak L  hip weakness, L low rib higher than R in supine, and gait deviations, and poor body mechanics which places strain on the abdominal/pelvic floor mm.   These are deficits that indicate an ineffective intraabdominal pressure system associated with increased risk for pt's  Pt was provided education on etiology of Sx with anatomy, physiology explanation with images along with the benefits of customized pelvic PT Tx based on pt's medical conditions and musculoskeletal deficits.  Explained the physiology of deep core mm coordination and roles of pelvic floor function in urination, defecation, sexual function, and postural control with deep core mm system.   Regional interdependent approaches will yield greater benefits in pt's POC due to the complexity of pt's medical Hx and the significant impact their Sx have had on their QOL.   Following Tx today which pt tolerated without complaints, pt demo'd equal alignment of pelvic girdle and increased L hip abduction strength post Tx. Pt  reported no pain with R sideflexion at L low back area which was concordant sign prior to Tx. Plan to assess coccyx and continue to address SIJ hypomobility to help pt regain ability to sit. Plan to address thoracic spine as there appears a torsion impacting gait  mechanics along with  uneven alignment between L/ R rib. Once pt's  thorax and pelvic alignment has been restored, pt will be ready to be trained with deep core coordination / pelvic floor training.  Initiated Dietitian today and pt demo'd properly after training.      Examination-Activity Limitations Continence    Stability/Clinical Decision Making Evolving/Moderate complexity    Clinical Decision Making Moderate    Rehab Potential Good    PT Frequency 1x / week    PT Duration Other (comment)   10   PT Treatment/Interventions Gait training;Stair training;Functional mobility training;Moist Heat;Therapeutic activities;Therapeutic exercise;Cryotherapy;Traction;Patient/family education;Neuromuscular re-education;Manual techniques;Balance training;Passive range of motion;Scar mobilization;Taping;Compression bandaging;Spinal Manipulations;Joint Manipulations    Consulted and Agree with Plan of Care Patient             Patient will benefit from skilled therapeutic intervention in order to improve the following deficits and impairments:  Decreased coordination, Decreased mobility, Decreased scar mobility, Increased muscle spasms, Decreased endurance, Decreased activity tolerance, Decreased balance, Difficulty walking, Decreased strength, Improper body mechanics, Postural dysfunction, Hypomobility, Increased fascial restricitons, Pain, Abnormal gait, Impaired flexibility, Impaired vision/preception  Visit Diagnosis: Sacrococcygeal disorders, not elsewhere classified  Other lack of coordination  Other abnormalities of gait and mobility  Retention of urine  Chronic pain of left knee  Chronic pain of right knee     Problem List Patient Active Problem List   Diagnosis Date Noted   Status post total hip replacement, right 01/22/2021   Right hip pain 01/27/2018   Lumbar spinal stenosis 01/27/2018   Gluteal tendinitis, right hip 01/27/2018   Calculus of gallbladder with  acute cholecystitis without obstruction 08/04/2017   Osteopenia 05/18/2015   METATARSALGIA 08/14/2008   PLANTAR FASCIITIS, RIGHT 08/14/2008   FOOT PAIN, RIGHT 08/14/2008    Jerl Mina, PT 07/04/2021, 4:30 PM  [facility] [address] [place], [place], [address] Phone: [phone]   Fax:  [phone]  Name: CHARLYE SPARE MRN: [id number] Date of Birth: May 01, 1956

## 2021-07-09 ENCOUNTER — Ambulatory Visit: Payer: PPO | Admitting: Physical Therapy

## 2021-07-09 ENCOUNTER — Other Ambulatory Visit: Payer: Self-pay

## 2021-07-09 DIAGNOSIS — R278 Other lack of coordination: Secondary | ICD-10-CM

## 2021-07-09 DIAGNOSIS — R339 Retention of urine, unspecified: Secondary | ICD-10-CM

## 2021-07-09 DIAGNOSIS — M533 Sacrococcygeal disorders, not elsewhere classified: Secondary | ICD-10-CM | POA: Diagnosis not present

## 2021-07-09 DIAGNOSIS — R2689 Other abnormalities of gait and mobility: Secondary | ICD-10-CM

## 2021-07-09 DIAGNOSIS — G8929 Other chronic pain: Secondary | ICD-10-CM

## 2021-07-09 NOTE — Patient Instructions (Addendum)
   kitchen counter stretches  Hands on kitchen counter,   Palms shoulder width apart  Minisquat postion Trunk is parallel to floor  __  Modified thread the needle L hand on R thigh, R thigh pushing out slightly as the L hands pull in,  elbow bent and pulls to theR,  Look under R armpit   Do the same to other side  ____   Clam Shell 45 Degrees  Lying with hips and knees bent 45, one pillow between knees and ankles. Heel together, toes apart like ballerina,  Lift knee with exhale while pressing heels together. Be sure pelvis does not roll backward. Do not arch back. Do 20 times, each leg, 2 times per day.     Complimentary stretch   -Figure-4   -Seated twist, R thigh over L below knee, pull with L arm, elbow back, R hand by hips  Inhale tall, exhale twist navel, chest, and head       __  Vaccuuming, raking, sweeping, use legs, not bending forward

## 2021-07-09 NOTE — Therapy (Signed)
[place] MAIN [facility] SERVICES [address] [place], [place], [address] Phone: [phone]   Fax:  [phone]  Physical Therapy Treatment  Patient Details  Name: Kathryn Hawkins MRN: [id number] Date of Birth: [birth year] Referring Provider (PT): Benjaman Kindler   Encounter Date: 07/09/2021   PT End of Session - 07/09/21 1513     Visit Number 2    Number of Visits 10    Date for PT Re-Evaluation 09/12/21    PT Start Time 1410    PT Stop Time 1505    PT Time Calculation (min) 55 min    Activity Tolerance No increased pain;Patient tolerated treatment well    Behavior During Therapy [facility] for tasks assessed/performed             Past Medical History:  Diagnosis Date   DDD (degenerative disc disease), lumbar 2017   Diverticulitis    Dyspareunia in female    Herniated disc, cervical    Lumbar spinal stenosis    Melanoma ([place]) 1994   Left leg. Level II, per patient   Osteopenia 2016   Vaginal dryness    Vulvar atrophy     Past Surgical History:  Procedure Laterality Date   ABDOMINAL HYSTERECTOMY  1994   with bladder tack   BLADDER SUSPENSION     BREAST BIOPSY Right 03/24/2019   [place] bx coil clip BENIGN BREAST TISSUE WITH PSEUDO-ANGIOMATOUS STROMAL HYPERPLASIA, USUAL DUCTAL HYPERPLASIA AND FOCAL FIBROADENOMATOID CHANGES   BREAST BIOPSY Right 03/21/2021   [place] bx mass, coil marker, path pending   BREAST BIOPSY Left 03/21/2021   stereo bx asymmetry, x marker, path pending   CHOLECYSTECTOMY N/A 08/05/2017   Procedure: LAPAROSCOPIC CHOLECYSTECTOMY;  Surgeon: Vickie Epley, MD;  Location: [facility] ORS;  Service: General;  Laterality: N/A;   COLONOSCOPY     2008, 2018   CYSTOURETHROSCOPY  01/2014   urethrolysis transvaginal   FLEXIBLE SIGMOIDOSCOPY  1994   KELOID EXCISION Right 1974   LAPAROSCOPIC OVARIAN CYSTECTOMY Right 1994   MELANOMA EXCISION Left    leg   TOTAL HIP ARTHROPLASTY Right 01/22/2021   Procedure: TOTAL HIP  ARTHROPLASTY ANTERIOR APPROACH;  Surgeon: Hessie Knows, MD;  Location: [facility] ORS;  Service: Orthopedics;  Laterality: Right;   TUBAL LIGATION      There were no vitals filed for this visit.   Subjective Assessment - 07/09/21 1411     Subjective Pt  reported she fell going up a ramp onto her L side and didnt pick up her L leg, She had minor scratches and was helped by a gentleman to get up but she felt she was able to have gotten up on her own. Pt had no increased pain after her fall. She raked leaves yesterday, she feels sore in her L SIJ area ( she points).  Pt feels the HEP is helping and feels she is on the right track. Pt had chiropractic appt yesterday and did not do anything around her hip. She felt good with the chiro treatment at her neck , feet, knees. and shoulders., upper back.    Patient Stated Goals compeltely empty bladder and strengthn her core                [facility] PT Assessment - 07/09/21 1424       Other:   Other/ Comments simulated raking leaves/ vaccumming, sweeping: excessive forward flexion, decreased use of lower kinetic chain      AROM   Overall AROM Comments  R sideflexion with L at L PSIS ( pre Tx), no pain  ( post Tx)      Strength   Overall Strength Comments hip ext R 2/5 pre Tx, post Tx, 3/5 , L 4-/5      Palpation   SI assessment  L PSIS more posterior, iliac crest levelled   ( post Tx: PSIS equally aligned)    Palpation comment R SIJ hypomobile at base of sacrum/ L SIJ hypomobile, R hamstring tightness                           [facility] Adult PT Treatment/Exercise - 07/09/21 1456       Therapeutic Activites    Other Therapeutic Activities body mechanics training with vaccuuming, sweeping, raking      Neuro Re-ed    Neuro Re-ed Details  cued for stretches and clams      Modalities   Modalities Moist Heat      Moist Heat Therapy   Number Minutes Moist Heat 5 Minutes    Moist Heat Location --   R SIJ/ lateral hip, in lower trunk  rotation with support props     Manual Therapy   Manual therapy comments B long distraction, STM/MWM/ rotational mob to promote more nutation of sacrum and rotational of ilia in sidelying and prone and hooklying                          PT Long Term Goals - 07/04/21 1444       PT LONG TERM GOAL #1   Title Pt will report improved Stool consistency Type 4 across 50% of the time instead of 25% of the time with increased water intake from,16 fl oz to 32 fl oz. in order to minimize straining her pelvic floor with bowel movements    Time 8    Period Weeks    Status New    Target Date 08/29/21      PT LONG TERM GOAL #2   Title Pt will demo proper deep core mm coordination with proper lengthening of pelvic floor in order to minimize waiting time to completely empty bladder from 5 min to < 1 min    Time 4    Period Weeks    Status New    Target Date 08/01/21      PT LONG TERM GOAL #3   Title Pt will demo proper body mechanics to minimzie straining pelvic floor/ abdomen in order to improve Sx    Time 2    Period Weeks    Status New    Target Date 07/18/21      PT LONG TERM GOAL #4   Title Pt will demo proper coccyx and SIJ mobility and alignment and be able to sit on hard bench without a pillow while eating with family at [place]. for 30 min    Time 6    Period Weeks    Status New    Target Date 08/15/21      PT LONG TERM GOAL #5   Title Pt will demo no more higher L iliac crest  and demo no pain with R sideflexion across 2 visits in order to progress to deep core training    Time 4    Period Weeks    Status New    Target Date 08/01/21      Additional Long Term Goals   Additional Long  Term Goals Yes      PT LONG TERM GOAL #6   Title Pt will improve FOTO lumbar score from 60 pts to > 64 pts in order to stand and bend for ADLs    Time 10    Period Weeks    Status New    Target Date 09/12/21      PT LONG TERM GOAL #7   Title Pt wil improved Urinary  Problem score on FOTO from 57 pts to > 62 pts and Bowel Constipation from 55 pts to > 60 pts in order to improve QOL    Time 10    Period Weeks    Status New    Target Date 09/12/21      PT LONG TERM GOAL #8   Title Pt will demo proper alignment with squat and report no knee pain in order to perform lifting objects off floor    Time 5    Status New    Target Date 08/08/21                   Plan - 07/09/21 1657     Clinical Impression Statement Pt had a fall last week but did not sustain any major injuries. Pt demo'd slightly posteriorly rotated PSIS on L and hypomobile SIJ / limited nutation of  tailbone today which improved postTx. Pt demo'd no SIJ pain with R sideflexion post Tx. Pt required cues for stretches and hip abduction strengthening. Povided cues for proper body mechanics with raking, vaccuuming, sweeping to minimize LBP. Plan to assess  flor to rise t/f at next session and continue with fall prevention training. Pt continues to benefit from skilled PT    Examination-Activity Limitations Continence    Stability/Clinical Decision Making Evolving/Moderate complexity    Rehab Potential Good    PT Frequency 1x / week    PT Duration Other (comment)   10   PT Treatment/Interventions Gait training;Stair training;Functional mobility training;Moist Heat;Therapeutic activities;Therapeutic exercise;Cryotherapy;Traction;Patient/family education;Neuromuscular re-education;Manual techniques;Balance training;Passive range of motion;Scar mobilization;Taping;Compression bandaging;Spinal Manipulations;Joint Manipulations    Consulted and Agree with Plan of Care Patient             Patient will benefit from skilled therapeutic intervention in order to improve the following deficits and impairments:  Decreased coordination, Decreased mobility, Decreased scar mobility, Increased muscle spasms, Decreased endurance, Decreased activity tolerance, Decreased balance, Difficulty walking,  Decreased strength, Improper body mechanics, Postural dysfunction, Hypomobility, Increased fascial restricitons, Pain, Abnormal gait, Impaired flexibility, Impaired vision/preception  Visit Diagnosis: Other lack of coordination  Retention of urine  Other abnormalities of gait and mobility  Sacrococcygeal disorders, not elsewhere classified  Chronic pain of left knee  Chronic pain of right knee     Problem List Patient Active Problem List   Diagnosis Date Noted   Status post total hip replacement, right 01/22/2021   Right hip pain 01/27/2018   Lumbar spinal stenosis 01/27/2018   Gluteal tendinitis, right hip 01/27/2018   Calculus of gallbladder with acute cholecystitis without obstruction 08/04/2017   Osteopenia 05/18/2015   METATARSALGIA 08/14/2008   PLANTAR FASCIITIS, RIGHT 08/14/2008   FOOT PAIN, RIGHT 08/14/2008    Jerl Mina, PT 07/09/2021, 5:00 PM  [place] [address] [place], [place], [address] Phone: [phone]   Fax:  [phone]  Name: KAYONNA LAWNICZAK MRN: [id number] Date of Birth: [birth year]

## 2021-07-16 ENCOUNTER — Ambulatory Visit: Payer: PPO | Admitting: Physical Therapy

## 2021-07-16 ENCOUNTER — Other Ambulatory Visit: Payer: Self-pay

## 2021-07-16 DIAGNOSIS — G8929 Other chronic pain: Secondary | ICD-10-CM

## 2021-07-16 DIAGNOSIS — M533 Sacrococcygeal disorders, not elsewhere classified: Secondary | ICD-10-CM | POA: Diagnosis not present

## 2021-07-16 DIAGNOSIS — R2689 Other abnormalities of gait and mobility: Secondary | ICD-10-CM

## 2021-07-16 DIAGNOSIS — R339 Retention of urine, unspecified: Secondary | ICD-10-CM

## 2021-07-16 DIAGNOSIS — M25562 Pain in left knee: Secondary | ICD-10-CM

## 2021-07-16 DIAGNOSIS — R278 Other lack of coordination: Secondary | ICD-10-CM

## 2021-07-16 NOTE — Therapy (Signed)
[place] MAIN [facility] SERVICES [address] [place], [place], [address] Phone: [phone]   Fax:  [phone]  Physical Therapy Treatment  Patient Details  Name: Kathryn Hawkins MRN: [id number] Date of Birth: 1956-03-04 Referring Provider (PT): Benjaman Kindler   Encounter Date: 07/16/2021   PT End of Session - 07/16/21 1910     Visit Number 3    Number of Visits 10    Date for PT Re-Evaluation 09/12/21    PT Start Time 6073    PT Stop Time 1500    PT Time Calculation (min) 55 min    Activity Tolerance No increased pain;Patient tolerated treatment well    Behavior During Therapy [facility] for tasks assessed/performed             Past Medical History:  Diagnosis Date   DDD (degenerative disc disease), lumbar 2017   Diverticulitis    Dyspareunia in female    Herniated disc, cervical    Lumbar spinal stenosis    Melanoma ([place]) 1994   Left leg. Level II, per patient   Osteopenia 2016   Vaginal dryness    Vulvar atrophy     Past Surgical History:  Procedure Laterality Date   ABDOMINAL HYSTERECTOMY  1994   with bladder tack   BLADDER SUSPENSION     BREAST BIOPSY Right 03/24/2019   [place] bx coil clip BENIGN BREAST TISSUE WITH PSEUDO-ANGIOMATOUS STROMAL HYPERPLASIA, USUAL DUCTAL HYPERPLASIA AND FOCAL FIBROADENOMATOID CHANGES   BREAST BIOPSY Right 03/21/2021   [place] bx mass, coil marker, path pending   BREAST BIOPSY Left 03/21/2021   stereo bx asymmetry, x marker, path pending   CHOLECYSTECTOMY N/A 08/05/2017   Procedure: LAPAROSCOPIC CHOLECYSTECTOMY;  Surgeon: Vickie Epley, MD;  Location: [facility] ORS;  Service: General;  Laterality: N/A;   COLONOSCOPY     2008, 2018   CYSTOURETHROSCOPY  01/2014   urethrolysis transvaginal   FLEXIBLE SIGMOIDOSCOPY  1994   KELOID EXCISION Right 1974   LAPAROSCOPIC OVARIAN CYSTECTOMY Right 1994   MELANOMA EXCISION Left    leg   TOTAL HIP ARTHROPLASTY Right 01/22/2021   Procedure: TOTAL HIP  ARTHROPLASTY ANTERIOR APPROACH;  Surgeon: Hessie Knows, MD;  Location: [facility] ORS;  Service: Orthopedics;  Laterality: Right;   TUBAL LIGATION      There were no vitals filed for this visit.   Subjective Assessment - 07/16/21 1409     Subjective Pt felt sore after last session. Pt noticed her bladder is not emptying s completely. Pt noticed she is more constipated since she started PT. Pt drinking her water .  Pt reported she sat on a hard surface with her husband 's jacket when she went to eat in a restaurant.    Patient Stated Goals compeltely empty bladder and strengthn her core                [facility] PT Assessment - 07/16/21 1434       Observation/Other Assessments   Observations pre Tx :limited diaphragmatic excursion, post Tx: improved      Coordination   Coordination and Movement Description scaption R/ hip ext L with c/o R SIJ more oblique overuse, B MMT 4/5  ( post Tx: no R SIJ pain )      AROM   Overall AROM Comments no pain with R sideflexion      Palpation   Spinal mobility hypomobile T3-4, T10-11, tight paraspinals R,    SI assessment  L PSIS more posterior, limited  extension of sacral base, tenderness L                           [facility] Adult PT Treatment/Exercise - 07/16/21 1857       Neuro Re-ed    Neuro Re-ed Details  cued for less chest breathing, anterior tilt of pelvis, cued for deep core level 1      Manual Therapy   Manual therapy comments PA mob Grade III at thoracic segmetns noted in assessment, STM/MWM at paraspinals to promote alignment of thoracic segments and decrease SIJ                          PT Long Term Goals - 07/04/21 1444       PT LONG TERM GOAL #1   Title Pt will report improved Stool consistency Type 4 across 50% of the time instead of 25% of the time with increased water intake from,16 fl oz to 32 fl oz. in order to minimize straining her pelvic floor with bowel movements    Time 8    Period Weeks     Status New    Target Date 08/29/21      PT LONG TERM GOAL #2   Title Pt will demo proper deep core mm coordination with proper lengthening of pelvic floor in order to minimize waiting time to completely empty bladder from 5 min to < 1 min    Time 4    Period Weeks    Status New    Target Date 08/01/21      PT LONG TERM GOAL #3   Title Pt will demo proper body mechanics to minimzie straining pelvic floor/ abdomen in order to improve Sx    Time 2    Period Weeks    Status New    Target Date 07/18/21      PT LONG TERM GOAL #4   Title Pt will demo proper coccyx and SIJ mobility and alignment and be able to sit on hard bench without a pillow while eating with family at [place]. for 30 min    Time 6    Period Weeks    Status New    Target Date 08/15/21      PT LONG TERM GOAL #5   Title Pt will demo no more higher L iliac crest  and demo no pain with R sideflexion across 2 visits in order to progress to deep core training    Time 4    Period Weeks    Status New    Target Date 08/01/21      Additional Long Term Goals   Additional Long Term Goals Yes      PT LONG TERM GOAL #6   Title Pt will improve FOTO lumbar score from 60 pts to > 64 pts in order to stand and bend for ADLs    Time 10    Period Weeks    Status New    Target Date 09/12/21      PT LONG TERM GOAL #7   Title Pt wil improved Urinary Problem score on FOTO from 57 pts to > 62 pts and Bowel Constipation from 55 pts to > 60 pts in order to improve QOL    Time 10    Period Weeks    Status New    Target Date 09/12/21      PT LONG TERM GOAL #8  Title Pt will demo proper alignment with squat and report no knee pain in order to perform lifting objects off floor    Time 5    Status New    Target Date 08/08/21                   Plan - 07/16/21 1915     Clinical Impression Statement Pt demo'd good carry over with levelled pelvic girdle from past 2 sessions which  helped to resolve diastasis recti.   Focused on address convex curve at thoracic spine and pt demo'd improve mobility and diaphragmatic excursion which yields better results with IAP system for pelvic floor lengthening required for optimal elimination of urine and bowels. Required cues for less overuse of abdominal mm with deep core level 1.  Plan to progress to deep core level 2. Pt continues to benefit from skilled PT.    Examination-Activity Limitations Continence    Stability/Clinical Decision Making Evolving/Moderate complexity    Rehab Potential Good    PT Frequency 1x / week    PT Duration Other (comment)   10   PT Treatment/Interventions Gait training;Stair training;Functional mobility training;Moist Heat;Therapeutic activities;Therapeutic exercise;Cryotherapy;Traction;Patient/family education;Neuromuscular re-education;Manual techniques;Balance training;Passive range of motion;Scar mobilization;Taping;Compression bandaging;Spinal Manipulations;Joint Manipulations    Consulted and Agree with Plan of Care Patient             Patient will benefit from skilled therapeutic intervention in order to improve the following deficits and impairments:  Decreased coordination, Decreased mobility, Decreased scar mobility, Increased muscle spasms, Decreased endurance, Decreased activity tolerance, Decreased balance, Difficulty walking, Decreased strength, Improper body mechanics, Postural dysfunction, Hypomobility, Increased fascial restricitons, Pain, Abnormal gait, Impaired flexibility, Impaired vision/preception  Visit Diagnosis: Other lack of coordination  Retention of urine  Other abnormalities of gait and mobility  Sacrococcygeal disorders, not elsewhere classified  Chronic pain of left knee  Chronic pain of right knee     Problem List Patient Active Problem List   Diagnosis Date Noted   Status post total hip replacement, right 01/22/2021   Right hip pain 01/27/2018   Lumbar spinal stenosis 01/27/2018   Gluteal  tendinitis, right hip 01/27/2018   Calculus of gallbladder with acute cholecystitis without obstruction 08/04/2017   Osteopenia 05/18/2015   METATARSALGIA 08/14/2008   PLANTAR FASCIITIS, RIGHT 08/14/2008   FOOT PAIN, RIGHT 08/14/2008    Jerl Mina, PT 07/16/2021, 7:20 PM  [place] MAIN [facility] SERVICES [address] [place], [place], [address] Phone: [phone]   Fax:  [phone]  Name: INICE SANLUIS MRN: [id number] Date of Birth: 1956/07/28

## 2021-07-16 NOTE — Patient Instructions (Signed)
Deep core level 1  twice a day   __  Standing with equal weight bearing instead of one leg

## 2021-07-23 ENCOUNTER — Other Ambulatory Visit: Payer: Self-pay

## 2021-07-23 ENCOUNTER — Ambulatory Visit: Payer: PPO | Admitting: Physical Therapy

## 2021-07-23 DIAGNOSIS — M533 Sacrococcygeal disorders, not elsewhere classified: Secondary | ICD-10-CM | POA: Diagnosis not present

## 2021-07-23 DIAGNOSIS — R278 Other lack of coordination: Secondary | ICD-10-CM

## 2021-07-23 DIAGNOSIS — R339 Retention of urine, unspecified: Secondary | ICD-10-CM

## 2021-07-23 DIAGNOSIS — G8929 Other chronic pain: Secondary | ICD-10-CM

## 2021-07-23 DIAGNOSIS — R2689 Other abnormalities of gait and mobility: Secondary | ICD-10-CM

## 2021-07-23 DIAGNOSIS — M25561 Pain in right knee: Secondary | ICD-10-CM

## 2021-07-23 NOTE — Patient Instructions (Signed)
Deep core level 1 and 2 ( twice a day)   __ Clam Shell 45 Degrees  Lying with hips and knees bent 45, one pillow between knees and ankles. Heel together, toes apart like ballerina,  Lift knee with exhale while pressing heels together. Be sure pelvis does not roll backward. Do not arch back. Do 20 times, each leg, 2 times per day.     Complimentary stretch: [employer] _ foot over _ thigh, opposite knee straight  3 breaths   __

## 2021-07-23 NOTE — Therapy (Signed)
[place] MAIN [facility] SERVICES [address] [place], [place], [address] Phone: [phone]   Fax:  [phone]  Physical Therapy Treatment  Patient Details  Name: Kathryn Hawkins MRN: [id number] Date of Birth: [birth year] Referring Provider (PT): Benjaman Kindler   Encounter Date: 07/23/2021   PT End of Session - 07/23/21 1458     Visit Number 4    Number of Visits 10    Date for PT Re-Evaluation 09/12/21    PT Start Time 1400    PT Stop Time 1500    PT Time Calculation (min) 60 min    Activity Tolerance No increased pain;Patient tolerated treatment well    Behavior During Therapy [facility] for tasks assessed/performed             Past Medical History:  Diagnosis Date   DDD (degenerative disc disease), lumbar 2017   Diverticulitis    Dyspareunia in female    Herniated disc, cervical    Lumbar spinal stenosis    Melanoma ([place]) 1994   Left leg. Level II, per patient   Osteopenia 2016   Vaginal dryness    Vulvar atrophy     Past Surgical History:  Procedure Laterality Date   ABDOMINAL HYSTERECTOMY  1994   with bladder tack   BLADDER SUSPENSION     BREAST BIOPSY Right 03/24/2019   [place] bx coil clip BENIGN BREAST TISSUE WITH PSEUDO-ANGIOMATOUS STROMAL HYPERPLASIA, USUAL DUCTAL HYPERPLASIA AND FOCAL FIBROADENOMATOID CHANGES   BREAST BIOPSY Right 03/21/2021   [place] bx mass, coil marker, path pending   BREAST BIOPSY Left 03/21/2021   stereo bx asymmetry, x marker, path pending   CHOLECYSTECTOMY N/A 08/05/2017   Procedure: LAPAROSCOPIC CHOLECYSTECTOMY;  Surgeon: Vickie Epley, MD;  Location: [facility] ORS;  Service: General;  Laterality: N/A;   COLONOSCOPY     2008, 2018   CYSTOURETHROSCOPY  01/2014   urethrolysis transvaginal   FLEXIBLE SIGMOIDOSCOPY  1994   KELOID EXCISION Right 1974   LAPAROSCOPIC OVARIAN CYSTECTOMY Right 1994   MELANOMA EXCISION Left    leg   TOTAL HIP ARTHROPLASTY Right 01/22/2021   Procedure: TOTAL HIP  ARTHROPLASTY ANTERIOR APPROACH;  Surgeon: Hessie Knows, MD;  Location: [facility] ORS;  Service: Orthopedics;  Laterality: Right;   TUBAL LIGATION      There were no vitals filed for this visit.   Subjective Assessment - 07/23/21 1406     Subjective Pt reported burning sensation at the area of the low back and sacrum. "It felt like it was on fire but it was short lived." She felt this while on riding / driving 4 hours away but it has been resolved since.  Pt did not get up once at nightto urinate. Pt got up only one time the other night instead of 3-4 x night.Pt is practicing not crunchingwhen getting out of bed because she notices the prolapse.    Patient Stated Goals compeltely empty bladder and strengthn her core                [facility] PT Assessment - 07/23/21 1408       Coordination   Coordination and Movement Description scaption R/ hip ext L with  no pain at R SIJ,  no oblique overuse      Palpation   SI assessment  hypomobile L SIJ, limited nutation at sacrum, tightness at attachments at ischial rami/  tuberosity  Pelvic Floor Special Questions - 07/23/21 1511     External Perineal Exam tightness at L coccygeus/ obt int               [facility] Adult PT Treatment/Exercise - 07/23/21 1511       Therapeutic Activites    Other Therapeutic Activities guided relaxation in supported yogaposes that promote pelvicfloor lengthening and sacral nutation      Neuro Re-ed    Neuro Re-ed Details  cued for clam shells, figure 4 stretch, relaxatino practice      Modalities   Modalities Moist Heat      Moist Heat Therapy   Number Minutes Moist Heat 5 Minutes    Moist Heat Location --   during guided relaxation     Manual Therapy   Manual therapy comments PA mob Grade III to promote nutation of sacrum, rotational mob to promote mobility at SIJ  L ,. STM/MWM at mm through clothing at pelvic floor in prone                           PT Long Term Goals - 07/23/21 1515       PT LONG TERM GOAL #1   Title Pt will report improved Stool consistency Type 4 across 50% of the time instead of 25% of the time with increased water intake from,16 fl oz to 32 fl oz. in order to minimize straining her pelvic floor with bowel movements    Time 8    Period Weeks    Status On-going    Target Date 08/29/21      PT LONG TERM GOAL #2   Title Pt will demo proper deep core mm coordination with proper lengthening of pelvic floor in order to minimize waiting time to completely empty bladder from 5 min to < 1 min    Time 4    Period Weeks    Status On-going    Target Date 08/01/21      PT LONG TERM GOAL #3   Title Pt will demo proper body mechanics to minimzie straining pelvic floor/ abdomen in order to improve Sx    Time 2    Period Weeks    Status On-going    Target Date 07/18/21      PT LONG TERM GOAL #4   Title Pt will demo proper coccyx and SIJ mobility and alignment and be able to sit on hard bench without a pillow while eating with family at restaurant. for 30 min    Time 6    Period Weeks    Status Achieved    Target Date 08/15/21      PT LONG TERM GOAL #5   Title Pt will demo no more higher L iliac crest  and demo no pain with R sideflexion across 2 visits in order to progress to deep core training    Time 4    Period Weeks    Status On-going    Target Date 08/01/21      PT LONG TERM GOAL #6   Title Pt will improve FOTO lumbar score from 60 pts to > 64 pts in order to stand and bend for ADLs    Time 10    Period Weeks    Status On-going    Target Date 09/12/21      PT LONG TERM GOAL #7   Title Pt wil improved Urinary Problem score on FOTO from 57 pts to >  62 pts and Bowel Constipation from 55 pts to > 60 pts in order to improve QOL    Time 10    Period Weeks    Status On-going    Target Date 09/12/21      PT LONG TERM GOAL #8   Title Pt will demo proper alignment with squat and report no knee pain in order to  perform lifting objects off floor    Time 5    Status On-going    Target Date 08/08/21                   Plan - 07/23/21 1458     Clinical Impression Statement Pt remains compliant with HEP and pays attention to body mechanics to minimize straining onto pelvic floor/ worsening prolapse. Pt reported two nights with decreased nocturia from 0-1 trip per night instead of 3-4 x  per night which indicates improvement.   Continued to further apply manual Tx to promote mobility at L SIJ and more nutation of sacrum/ extension of coccyx. Pt demo'd improved deep core coordination with level 1 and progressed to level 2 post training. Added hip abduction strengthening due to weakness. Pt reported feeling more relaxed after relaxation training and was explained the role of relaxation for emptying urine/ bowels.   Pt continues to benefit from skilled PT. Plan to assess abdomen and pelvic floor at next session.     Examination-Activity Limitations Continence    Stability/Clinical Decision Making Evolving/Moderate complexity    Rehab Potential Good    PT Frequency 1x / week    PT Duration Other (comment)   10   PT Treatment/Interventions Gait training;Stair training;Functional mobility training;Moist Heat;Therapeutic activities;Therapeutic exercise;Cryotherapy;Traction;Patient/family education;Neuromuscular re-education;Manual techniques;Balance training;Passive range of motion;Scar mobilization;Taping;Compression bandaging;Spinal Manipulations;Joint Manipulations    Consulted and Agree with Plan of Care Patient             Patient will benefit from skilled therapeutic intervention in order to improve the following deficits and impairments:  Decreased coordination, Decreased mobility, Decreased scar mobility, Increased muscle spasms, Decreased endurance, Decreased activity tolerance, Decreased balance, Difficulty walking, Decreased strength, Improper body mechanics, Postural dysfunction,  Hypomobility, Increased fascial restricitons, Pain, Abnormal gait, Impaired flexibility, Impaired vision/preception  Visit Diagnosis: Other lack of coordination  Sacrococcygeal disorders, not elsewhere classified  Retention of urine  Other abnormalities of gait and mobility  Chronic pain of left knee  Chronic pain of right knee     Problem List Patient Active Problem List   Diagnosis Date Noted   Status post total hip replacement, right 01/22/2021   Right hip pain 01/27/2018   Lumbar spinal stenosis 01/27/2018   Gluteal tendinitis, right hip 01/27/2018   Calculus of gallbladder with acute cholecystitis without obstruction 08/04/2017   Osteopenia 05/18/2015   METATARSALGIA 08/14/2008   PLANTAR FASCIITIS, RIGHT 08/14/2008   FOOT PAIN, RIGHT 08/14/2008    Jerl Mina, PT 07/23/2021, 3:43 PM  [place] MAIN [facility] SERVICES [address] [place], [place], [address] Phone: [phone]   Fax:  [phone]  Name: Kathryn Hawkins MRN: [id number] Date of Birth: 1956/06/05

## 2021-08-01 ENCOUNTER — Ambulatory Visit: Payer: PPO | Admitting: Physical Therapy

## 2021-08-01 ENCOUNTER — Other Ambulatory Visit: Payer: Self-pay

## 2021-08-01 DIAGNOSIS — M533 Sacrococcygeal disorders, not elsewhere classified: Secondary | ICD-10-CM | POA: Diagnosis not present

## 2021-08-01 DIAGNOSIS — R278 Other lack of coordination: Secondary | ICD-10-CM

## 2021-08-01 DIAGNOSIS — R339 Retention of urine, unspecified: Secondary | ICD-10-CM

## 2021-08-01 DIAGNOSIS — R2689 Other abnormalities of gait and mobility: Secondary | ICD-10-CM

## 2021-08-01 DIAGNOSIS — G8929 Other chronic pain: Secondary | ICD-10-CM

## 2021-08-01 DIAGNOSIS — M25562 Pain in left knee: Secondary | ICD-10-CM

## 2021-08-01 NOTE — Patient Instructions (Signed)
°  Clam Shell 45 Degrees  Lying with hips and knees bent 45, one pillow between knees and ankles. Heel together, toes apart like ballerina,  Lift knee with exhale while pressing heels together. Be sure pelvis does not roll backward. Do not arch back. Do 20 times, each leg, 2 times per day.     Complimentary stretch: [employer] _ foot over _ thigh, opposite knee straight  3 breaths   __  Deep core level 1-2 ( handout) 2 x day

## 2021-08-01 NOTE — Therapy (Signed)
[place] MAIN [facility] SERVICES [address] [place], [place], [address] Phone: [phone]   Fax:  [phone]  Physical Therapy Treatment  Patient Details  Name: Kathryn Hawkins MRN: [id number] Date of Birth: 01/18/1956 Referring Provider (PT): Benjaman Kindler   Encounter Date: 08/01/2021   PT End of Session - 08/01/21 1416     Visit Number 5    Number of Visits 10    Date for PT Re-Evaluation 09/12/21    PT Start Time 0300    PT Stop Time 1503    PT Time Calculation (min) 55 min    Activity Tolerance No increased pain;Patient tolerated treatment well    Behavior During Therapy [facility] for tasks assessed/performed             Past Medical History:  Diagnosis Date   DDD (degenerative disc disease), lumbar 2017   Diverticulitis    Dyspareunia in female    Herniated disc, cervical    Lumbar spinal stenosis    Melanoma ([place]) 1994   Left leg. Level II, per patient   Osteopenia 2016   Vaginal dryness    Vulvar atrophy     Past Surgical History:  Procedure Laterality Date   ABDOMINAL HYSTERECTOMY  1994   with bladder tack   BLADDER SUSPENSION     BREAST BIOPSY Right 03/24/2019   [place] bx coil clip BENIGN BREAST TISSUE WITH PSEUDO-ANGIOMATOUS STROMAL HYPERPLASIA, USUAL DUCTAL HYPERPLASIA AND FOCAL FIBROADENOMATOID CHANGES   BREAST BIOPSY Right 03/21/2021   [place] bx mass, coil marker, path pending   BREAST BIOPSY Left 03/21/2021   stereo bx asymmetry, x marker, path pending   CHOLECYSTECTOMY N/A 08/05/2017   Procedure: LAPAROSCOPIC CHOLECYSTECTOMY;  Surgeon: Vickie Epley, MD;  Location: [facility] ORS;  Service: General;  Laterality: N/A;   COLONOSCOPY     2008, 2018   CYSTOURETHROSCOPY  01/2014   urethrolysis transvaginal   FLEXIBLE SIGMOIDOSCOPY  1994   KELOID EXCISION Right 1974   LAPAROSCOPIC OVARIAN CYSTECTOMY Right 1994   MELANOMA EXCISION Left    leg   TOTAL HIP ARTHROPLASTY Right 01/22/2021   Procedure: TOTAL HIP  ARTHROPLASTY ANTERIOR APPROACH;  Surgeon: Hessie Knows, MD;  Location: [facility] ORS;  Service: Orthopedics;  Laterality: Right;   TUBAL LIGATION      There were no vitals filed for this visit.   Subjective Assessment - 08/01/21 1411     Subjective Pt notices prolapse when bending incorrectly and pushing up in bed.  It does not take as long to pee but sometimes  she still has to sit . Pt is getting up no more than 2 x a night compared to 3-5 xnight.    Patient Stated Goals compeltely empty bladder and strengthn her core                [facility] PT Assessment - 08/01/21 1517       Palpation   SI assessment  L SIJ hypomobile, tenderness, posterior hip abductor mm                        Pelvic Floor Special Questions - 08/01/21 1544     External Perineal Exam tightness / tenderness  L sacrococcygeus/ tuberous  ligaments,  coccygeus mm               [facility] Adult PT Treatment/Exercise - 08/01/21 1548       Neuro Re-ed    Neuro Re-ed Details  cued  for deep core  level 2      Modalities   Modalities Moist Heat      Moist Heat Therapy   Number Minutes Moist Heat 5 Minutes    Moist Heat Location --   sacrum , guided relaxation     Manual Therapy   Manual therapy comments STM/MWM at problem areas noted to promote mobility of L ilia anterior rotation , extension of coccyx                          PT Long Term Goals - 08/01/21 1416       PT LONG TERM GOAL #1   Title Pt will report improved Stool consistency Type 4 across 50% of the time instead of 25% of the time with increased water intake from,16 fl oz to 32 fl oz. in order to minimize straining her pelvic floor with bowel movements    Time 8    Period Weeks    Status On-going    Target Date 08/29/21      PT LONG TERM GOAL #2   Title Pt will demo proper deep core mm coordination with proper lengthening of pelvic floor in order to minimize waiting time to completely empty bladder from 5 min  to < 1 min    Time 4    Period Weeks    Status On-going    Target Date 08/01/21      PT LONG TERM GOAL #3   Title Pt will demo proper body mechanics to minimzie straining pelvic floor/ abdomen in order to improve Sx    Time 2    Period Weeks    Status On-going    Target Date 07/18/21      PT LONG TERM GOAL #4   Title Pt will demo proper coccyx and SIJ mobility and alignment and be able to sit on hard bench without a pillow while eating with family at restaurant. for 30 min    Time 6    Period Weeks    Status Achieved    Target Date 08/15/21      PT LONG TERM GOAL #5   Title Pt will demo no more higher L iliac crest  and demo no pain with R sideflexion across 2 visits in order to progress to deep core training    Time 4    Period Weeks    Status Achieved    Target Date 08/01/21      PT LONG TERM GOAL #6   Title Pt will improve FOTO lumbar score from 60 pts to > 64 pts in order to stand and bend for ADLs    Time 10    Period Weeks    Status On-going    Target Date 09/12/21      PT LONG TERM GOAL #7   Title Pt wil improved Urinary Problem score on FOTO from 57 pts to > 62 pts and Bowel Constipation from 55 pts to > 60 pts in order to improve QOL    Time 10    Period Weeks    Status On-going    Target Date 09/12/21      PT LONG TERM GOAL #8   Title Pt will demo proper alignment with squat and report no knee pain in order to perform lifting objects off floor    Time 5    Status On-going    Target Date 08/08/21  Plan - 08/01/21 1416     Clinical Impression Statement Pt is making improvements at her 5th visit as she reports nocturia decreased to1-2 epsiodes a night compared to 3-5 xnight.  Pt also no longer has low back pain with sideflexion. Pt presents with levelled pelvic girdle and spinal alignment for the past sessions.   Continued to work on L SIJ hypomobility where pt has experienced pain for years. Pt demo'd improved mobility at SIJ post  Tx. Plan to continue promoting extension of coccyx and ilia mobility on L and assess R. Pt demo'd IND with deep core strengthening which will help maintain pelvic girdle stability and enhance IAP system for pelvic floor function. Plan to assess pelvic floor at upcoming sessions to assess prolapse.  Pt benefits from skilled PT.    Examination-Activity Limitations Continence    Stability/Clinical Decision Making Evolving/Moderate complexity    Rehab Potential Good    PT Frequency 1x / week    PT Duration Other (comment)   10   PT Treatment/Interventions Gait training;Stair training;Functional mobility training;Moist Heat;Therapeutic activities;Therapeutic exercise;Cryotherapy;Traction;Patient/family education;Neuromuscular re-education;Manual techniques;Balance training;Passive range of motion;Scar mobilization;Taping;Compression bandaging;Spinal Manipulations;Joint Manipulations    Consulted and Agree with Plan of Care Patient             Patient will benefit from skilled therapeutic intervention in order to improve the following deficits and impairments:  Decreased coordination, Decreased mobility, Decreased scar mobility, Increased muscle spasms, Decreased endurance, Decreased activity tolerance, Decreased balance, Difficulty walking, Decreased strength, Improper body mechanics, Postural dysfunction, Hypomobility, Increased fascial restricitons, Pain, Abnormal gait, Impaired flexibility, Impaired vision/preception  Visit Diagnosis: Sacrococcygeal disorders, not elsewhere classified  Other lack of coordination  Retention of urine  Other abnormalities of gait and mobility  Chronic pain of left knee  Chronic pain of right knee     Problem List Patient Active Problem List   Diagnosis Date Noted   Status post total hip replacement, right 01/22/2021   Right hip pain 01/27/2018   Lumbar spinal stenosis 01/27/2018   Gluteal tendinitis, right hip 01/27/2018   Calculus of gallbladder  with acute cholecystitis without obstruction 08/04/2017   Osteopenia 05/18/2015   METATARSALGIA 08/14/2008   PLANTAR FASCIITIS, RIGHT 08/14/2008   FOOT PAIN, RIGHT 08/14/2008    Jerl Mina, PT 08/01/2021, 4:04 PM  [place] [facility] MAIN [facility] SERVICES [address] [place], [place], [address] Phone: [phone]   Fax:  [phone]  Name: ESBEYDI MANAGO MRN: [id number] Date of Birth: 19-Aug-1955

## 2021-08-06 ENCOUNTER — Other Ambulatory Visit: Payer: Self-pay

## 2021-08-06 ENCOUNTER — Ambulatory Visit: Payer: PPO | Attending: Obstetrics and Gynecology | Admitting: Physical Therapy

## 2021-08-06 DIAGNOSIS — R2689 Other abnormalities of gait and mobility: Secondary | ICD-10-CM | POA: Insufficient documentation

## 2021-08-06 DIAGNOSIS — M25561 Pain in right knee: Secondary | ICD-10-CM | POA: Diagnosis not present

## 2021-08-06 DIAGNOSIS — M533 Sacrococcygeal disorders, not elsewhere classified: Secondary | ICD-10-CM | POA: Insufficient documentation

## 2021-08-06 DIAGNOSIS — R339 Retention of urine, unspecified: Secondary | ICD-10-CM | POA: Diagnosis not present

## 2021-08-06 DIAGNOSIS — R278 Other lack of coordination: Secondary | ICD-10-CM | POA: Insufficient documentation

## 2021-08-06 DIAGNOSIS — M25562 Pain in left knee: Secondary | ICD-10-CM | POA: Diagnosis not present

## 2021-08-06 DIAGNOSIS — G8929 Other chronic pain: Secondary | ICD-10-CM | POA: Diagnosis not present

## 2021-08-06 NOTE — Therapy (Signed)
[place] MAIN [facility] SERVICES [address] [place], [place], [address] Phone: [phone]   Fax:  [phone]  Physical Therapy Treatment  Patient Details  Name: Kathryn Hawkins MRN: [id number] Date of Birth: [birth year] Referring Provider (PT): Benjaman Kindler   Encounter Date: 08/06/2021   PT End of Session - 08/06/21 1410     Visit Number 6    Number of Visits 10    Date for PT Re-Evaluation 09/12/21    PT Start Time 1300    PT Stop Time 1400    PT Time Calculation (min) 60 min    Activity Tolerance No increased pain;Patient tolerated treatment well    Behavior During Therapy [facility] for tasks assessed/performed             Past Medical History:  Diagnosis Date   DDD (degenerative disc disease), lumbar 2017   Diverticulitis    Dyspareunia in female    Herniated disc, cervical    Lumbar spinal stenosis    Melanoma ([place]) 1994   Left leg. Level II, per patient   Osteopenia 2016   Vaginal dryness    Vulvar atrophy     Past Surgical History:  Procedure Laterality Date   ABDOMINAL HYSTERECTOMY  1994   with bladder tack   BLADDER SUSPENSION     BREAST BIOPSY Right 03/24/2019   [place] bx coil clip BENIGN BREAST TISSUE WITH PSEUDO-ANGIOMATOUS STROMAL HYPERPLASIA, USUAL DUCTAL HYPERPLASIA AND FOCAL FIBROADENOMATOID CHANGES   BREAST BIOPSY Right 03/21/2021   [place] bx mass, coil marker, path pending   BREAST BIOPSY Left 03/21/2021   stereo bx asymmetry, x marker, path pending   CHOLECYSTECTOMY N/A 08/05/2017   Procedure: LAPAROSCOPIC CHOLECYSTECTOMY;  Surgeon: Vickie Epley, MD;  Location: [facility] ORS;  Service: General;  Laterality: N/A;   COLONOSCOPY     2008, 2018   CYSTOURETHROSCOPY  01/2014   urethrolysis transvaginal   FLEXIBLE SIGMOIDOSCOPY  1994   KELOID EXCISION Right 1974   LAPAROSCOPIC OVARIAN CYSTECTOMY Right 1994   MELANOMA EXCISION Left    leg   TOTAL HIP ARTHROPLASTY Right 01/22/2021   Procedure: TOTAL HIP  ARTHROPLASTY ANTERIOR APPROACH;  Surgeon: Hessie Knows, MD;  Location: [facility] ORS;  Service: Orthopedics;  Laterality: Right;   TUBAL LIGATION      There were no vitals filed for this visit.   Subjective Assessment - 08/06/21 1404     Subjective Pt still has to sit for a while to pee. Pt does not want to strain. Pt noticed the burning at the L glut after the session and it went away after 2 days and felt better. Pt noticed leess constant burnning at this area compared to prior to PT.  Pt notices her L foot has been catching at ballmounds like she is not lifting up her foot. She noticed yesterday she did it with R foot.    Patient Stated Goals compeltely empty bladder and strengthn her core                [facility] PT Assessment - 08/06/21 1417       Coordination   Coordination and Movement Description no perturbation with posterior sling test      Strength   Overall Strength Comments Rhipflex 3/5, L 4+/5  ( post Tx: 4/5 R), single UE support MMT 4/5  L 20 reps, R 15 reps      Palpation   Spinal mobility R   laterally shifted T2-3 segments, tenderness./ tightness at  SCM , occiputal mm    SI assessment  R low rib slightly to the R and slightly higher than L low rib    Palpation comment tightness along medial scapula, interspinals mm R                           [facility] Adult PT Treatment/Exercise - 08/06/21 1454       Neuro Re-ed    Neuro Re-ed Details  cued for heel raises, neck stretches      Modalities   Modalities Moist Heat      Moist Heat Therapy   Number Minutes Moist Heat 5 Minutes    Moist Heat Location --   cervical, with sandbag weights 4lb each pect for passive stretching, unbilled     Manual Therapy   Manual therapy comments STM/MWM at problem areas noted to promote mobility of L ilia anterior rotation , extension of coccyx                          PT Long Term Goals - 08/06/21 1552       PT LONG TERM GOAL #1   Title Pt will  report improved Stool consistency Type 4 across 50% of the time instead of 25% of the time with increased water intake from,16 fl oz to 32 fl oz. in order to minimize straining her pelvic floor with bowel movements    Time 8    Period Weeks    Status On-going    Target Date 08/29/21      PT LONG TERM GOAL #2   Title Pt will demo proper deep core mm coordination with proper lengthening of pelvic floor in order to minimize waiting time to completely empty bladder from 5 min to < 1 min    Time 4    Period Weeks    Status On-going    Target Date 08/01/21      PT LONG TERM GOAL #3   Title Pt will demo proper body mechanics to minimzie straining pelvic floor/ abdomen in order to improve Sx    Time 2    Period Weeks    Status On-going    Target Date 07/18/21      PT LONG TERM GOAL #4   Title Pt will demo proper coccyx and SIJ mobility and alignment and be able to sit on hard bench without a pillow while eating with family at restaurant. for 30 min    Time 6    Period Weeks    Status Achieved    Target Date 08/15/21      PT LONG TERM GOAL #5   Title Pt will demo no more higher L iliac crest  and demo no pain with R sideflexion across 2 visits in order to progress to deep core training    Time 4    Period Weeks    Status Achieved    Target Date 08/01/21      Additional Long Term Goals   Additional Long Term Goals Yes      PT LONG TERM GOAL #6   Title Pt will improve FOTO lumbar score from 60 pts to > 64 pts in order to stand and bend for ADLs    Time 10    Period Weeks    Status On-going    Target Date 09/12/21      PT LONG TERM GOAL #7   Title  Pt wil improved Urinary Problem score on FOTO from 57 pts to > 62 pts and Bowel Constipation from 55 pts to > 60 pts in order to improve QOL    Time 10    Period Weeks    Status On-going    Target Date 09/12/21      PT LONG TERM GOAL #8   Title Pt will demo proper alignment with squat and report no knee pain in order to perform  lifting objects off floor    Time 5    Status On-going    Target Date 08/08/21      PT LONG TERM GOAL  #9   TITLE Pt will demo increased R hip mobilty with figure -4 stretch with less distance from tib plateau lateral border to floor ( 65 cm to < 64 cm) to perform stretch, don/doff shoes with more mobility    Baseline L 62 cm, R 65 cm tib plateau to floor    Time 4    Period Weeks    Status New    Target Date 09/03/21                   Plan - 08/06/21 1503     Clinical Impression Statement Pelvic girdle alignment continues to be present.   Pt continued to require manual Tx along R thoracic/cervical spine where deviations were present. Pt showed a more medially aligned T2-3 post Tx . Assessed hip flexion strength which was weaker on R but was improved post Tx.   Added heel raises to help with propulsion of foot and minimize her c/o catching her foot and not able to lift her foot lately. Anticipate more propioception training will be needed to help pt get adjusted to new alignment of pelvis/ spine.   Plan to assess pelvic floor at upcoming sessions.  Pt continues to benefit from skilled PT.    Examination-Activity Limitations Continence    Stability/Clinical Decision Making Evolving/Moderate complexity    Rehab Potential Good    PT Frequency 1x / week    PT Duration Other (comment)   10   PT Treatment/Interventions Gait training;Stair training;Functional mobility training;Moist Heat;Therapeutic activities;Therapeutic exercise;Cryotherapy;Traction;Patient/family education;Neuromuscular re-education;Manual techniques;Balance training;Passive range of motion;Scar mobilization;Taping;Compression bandaging;Spinal Manipulations;Joint Manipulations    Consulted and Agree with Plan of Care Patient             Patient will benefit from skilled therapeutic intervention in order to improve the following deficits and impairments:  Decreased coordination, Decreased mobility, Decreased  scar mobility, Increased muscle spasms, Decreased endurance, Decreased activity tolerance, Decreased balance, Difficulty walking, Decreased strength, Improper body mechanics, Postural dysfunction, Hypomobility, Increased fascial restricitons, Pain, Abnormal gait, Impaired flexibility, Impaired vision/preception  Visit Diagnosis: Sacrococcygeal disorders, not elsewhere classified  Other lack of coordination  Retention of urine  Other abnormalities of gait and mobility  Chronic pain of left knee  Chronic pain of right knee     Problem List Patient Active Problem List   Diagnosis Date Noted   Status post total hip replacement, right 01/22/2021   Right hip pain 01/27/2018   Lumbar spinal stenosis 01/27/2018   Gluteal tendinitis, right hip 01/27/2018   Calculus of gallbladder with acute cholecystitis without obstruction 08/04/2017   Osteopenia 05/18/2015   METATARSALGIA 08/14/2008   PLANTAR FASCIITIS, RIGHT 08/14/2008   FOOT PAIN, RIGHT 08/14/2008    Jerl Mina, PT 08/06/2021, 3:57 PM  [place] MAIN [facility] SERVICES [address] [place], [place], [address] Phone:  [phone]   Fax:  [phone]  Name: CASADY VOSHELL MRN: [id number] Date of Birth: 12-07-1955

## 2021-08-06 NOTE — Patient Instructions (Addendum)
Heel raises 20 reps hand on counter   __    Neck / shoulder stretches:    _wall stretch Clock stretch with head turns :  stand perpendicular to the wall, L side to the wall Tilt head to wall  Place L palm at 7 o clock Chin tuck like you are looking into armpit Look at "[place] on giant map  Swivel head with chin tucked to look in upper corner of ceiling as if you are look at  [place] on giant map "   5 reps   Switch direction, R palm on wall at 5 o 'clock   Chin tuck like you are looking into armpit Look at "[place]  on giant map  Swivel head with chin tucked to look in upper corner of ceiling as if you are look at  [facility] on giant map "   5 reps    ___

## 2021-08-12 ENCOUNTER — Other Ambulatory Visit: Payer: Self-pay

## 2021-08-12 ENCOUNTER — Ambulatory Visit: Payer: PPO | Admitting: Physical Therapy

## 2021-08-12 DIAGNOSIS — R2689 Other abnormalities of gait and mobility: Secondary | ICD-10-CM

## 2021-08-12 DIAGNOSIS — G8929 Other chronic pain: Secondary | ICD-10-CM

## 2021-08-12 DIAGNOSIS — M533 Sacrococcygeal disorders, not elsewhere classified: Secondary | ICD-10-CM | POA: Diagnosis not present

## 2021-08-12 DIAGNOSIS — R339 Retention of urine, unspecified: Secondary | ICD-10-CM

## 2021-08-12 DIAGNOSIS — M25561 Pain in right knee: Secondary | ICD-10-CM

## 2021-08-12 DIAGNOSIS — R278 Other lack of coordination: Secondary | ICD-10-CM

## 2021-08-12 DIAGNOSIS — M25562 Pain in left knee: Secondary | ICD-10-CM

## 2021-08-12 NOTE — Patient Instructions (Addendum)
Applying Pelvic tilts:  Finding a comfortable position when laying on your back  Laying on your back, lift hips up, then scoot tail under, lowering ribs / midback first, then the low back Pillow under knees    Decreasing Low back pain:  Pelvic tilts Forward, Back, and Neutral   STANDING ( neutral is where you want to practice finding more and more often. More weight across the ball mound of feet and heels not only the heels, and not locking the knees.   SITTING: feet under knees, hip width apart. Weigh on the sitting bones. Thumb on the back iliac crest, Index finger at the front of the hip. Rock through 3 positions to find neutral, press in the feet and sense the sitting bones ( ischial tuberosity) in contact to the seat.   Posterior tilt ( thumb is lower)  Anterior tilt ( index finger is lower).   Neutral ( thumb and index finger is levelled)    Sitting at work chair that may have a dip in the seat Place folded towel/ blanket placed towards the back of the seat , sitting on sitting bones , don't lean to the back of the chair   ___  Perform pelvic tilts  before deep core exericises and find pelvic neutral / slight anterior tilt for you with awareness of feet   Find pelvic tilt when standing and when on the toileting   ___   Practice proper pelvic floor coordination  Inhale: expand pelvic floor muscles Exhale" "j" scoop, allow pelvic floor to close, lift first before belly sinks   ( not "draw abdominal muscle to spine" or strain with abdominal muscles")

## 2021-08-12 NOTE — Therapy (Signed)
[place] MAIN [facility] SERVICES [address] [place], [place], [address] Phone: [phone]   Fax:  [phone]  Physical Therapy Treatment  Patient Details  Name: Kathryn Hawkins MRN: [id number] Date of Birth: June 11, 1956 Referring Provider (PT): Benjaman Kindler   Encounter Date: 08/12/2021   PT End of Session - 08/12/21 0930     Visit Number 7    Number of Visits 10    Date for PT Re-Evaluation 09/12/21    PT Start Time 0904    PT Stop Time 1000    PT Time Calculation (min) 56 min    Activity Tolerance No increased pain;Patient tolerated treatment well    Behavior During Therapy [facility] for tasks assessed/performed             Past Medical History:  Diagnosis Date   DDD (degenerative disc disease), lumbar 2017   Diverticulitis    Dyspareunia in female    Herniated disc, cervical    Lumbar spinal stenosis    Melanoma ([place]) 1994   Left leg. Level II, per patient   Osteopenia 2016   Vaginal dryness    Vulvar atrophy     Past Surgical History:  Procedure Laterality Date   ABDOMINAL HYSTERECTOMY  1994   with bladder tack   BLADDER SUSPENSION     BREAST BIOPSY Right 03/24/2019   [place] bx coil clip BENIGN BREAST TISSUE WITH PSEUDO-ANGIOMATOUS STROMAL HYPERPLASIA, USUAL DUCTAL HYPERPLASIA AND FOCAL FIBROADENOMATOID CHANGES   BREAST BIOPSY Right 03/21/2021   [place] bx mass, coil marker, path pending   BREAST BIOPSY Left 03/21/2021   stereo bx asymmetry, x marker, path pending   CHOLECYSTECTOMY N/A 08/05/2017   Procedure: LAPAROSCOPIC CHOLECYSTECTOMY;  Surgeon: Vickie Epley, MD;  Location: [facility] ORS;  Service: General;  Laterality: N/A;   COLONOSCOPY     2008, 2018   CYSTOURETHROSCOPY  01/2014   urethrolysis transvaginal   FLEXIBLE SIGMOIDOSCOPY  1994   KELOID EXCISION Right 1974   LAPAROSCOPIC OVARIAN CYSTECTOMY Right 1994   MELANOMA EXCISION Left    leg   TOTAL HIP ARTHROPLASTY Right 01/22/2021   Procedure: TOTAL HIP  ARTHROPLASTY ANTERIOR APPROACH;  Surgeon: Hessie Knows, MD;  Location: [facility] ORS;  Service: Orthopedics;  Laterality: Right;   TUBAL LIGATION      There were no vitals filed for this visit.   Subjective Assessment - 08/12/21 0909     Subjective Pt reported mm ache in the L shoulder and neck after last session. Pt took a mm relaxer and it helped. 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    Patient Stated Goals compeltely empty bladder and strengthn her core                [facility] PT Assessment - 08/12/21 1001       Palpation   SI assessment  tightness . / tenderness at R SIJ at base, tenderness in R groin in hip flexion end range and FADDIR  (postTx"no pain in these positions)                        Pelvic Floor Special Questions - 08/12/21 1002     External Perineal Exam without clothing: tightness at R anterior triangle and obt int ( improved lengthening post Tx)               [facility] Adult PT Treatment/Exercise - 08/12/21 1003       Neuro Re-ed  Neuro Re-ed Details  excessive cues for pelvic tilt to find slight anterior tilt in hooklying and standing, and cues for awareness of feet in standing position and lesshyperextended knees      Manual Therapy   Manual therapy comments STM/MWM at problem areas noted to promote R anterior pelvic floor mm,  PA mobgrade III at R SIJ to promote mobility and nutation of sacrum                          PT Long Term Goals - 08/06/21 1552       PT LONG TERM GOAL #1   Title Pt will report improved Stool consistency Type 4 across 50% of the time instead of 25% of the time with increased water intake from,16 fl oz to 32 fl oz. in order to minimize straining her pelvic floor with bowel movements    Time 8    Period Weeks    Status On-going    Target Date 08/29/21      PT LONG TERM GOAL #2   Title Pt will demo proper deep core mm coordination with proper lengthening of pelvic floor in order to minimize  waiting time to completely empty bladder from 5 min to < 1 min    Time 4    Period Weeks    Status On-going    Target Date 08/01/21      PT LONG TERM GOAL #3   Title Pt will demo proper body mechanics to minimzie straining pelvic floor/ abdomen in order to improve Sx    Time 2    Period Weeks    Status On-going    Target Date 07/18/21      PT LONG TERM GOAL #4   Title Pt will demo proper coccyx and SIJ mobility and alignment and be able to sit on hard bench without a pillow while eating with family at restaurant. for 30 min    Time 6    Period Weeks    Status Achieved    Target Date 08/15/21      PT LONG TERM GOAL #5   Title Pt will demo no more higher L iliac crest  and demo no pain with R sideflexion across 2 visits in order to progress to deep core training    Time 4    Period Weeks    Status Achieved    Target Date 08/01/21      Additional Long Term Goals   Additional Long Term Goals Yes      PT LONG TERM GOAL #6   Title Pt will improve FOTO lumbar score from 60 pts to > 64 pts in order to stand and bend for ADLs    Time 10    Period Weeks    Status On-going    Target Date 09/12/21      PT LONG TERM GOAL #7   Title Pt wil improved Urinary Problem score on FOTO from 57 pts to > 62 pts and Bowel Constipation from 55 pts to > 60 pts in order to improve QOL    Time 10    Period Weeks    Status On-going    Target Date 09/12/21      PT LONG TERM GOAL #8   Title Pt will demo proper alignment with squat and report no knee pain in order to perform lifting objects off floor    Time 5    Status On-going  Target Date 08/08/21      PT LONG TERM GOAL  #9   TITLE --    Baseline --    Time --    Period --    Status --    Target Date --                   Plan - 08/12/21 0930     Clinical Impression Statement Assessed pelvic floor with external techniques and pt demo'd increased tightness at anterior mm and obturator internus. R SIJ was also hypomobile with  limited sacral nutation. Manual Tx  was tolerated and pt demo'd improved mobility in these areas. Pt required excessive cues for anterior tilt of pelvic floor.   Pt will continue to benefit from skilled PT to improve pelvic propioception and pelvic floor coordination. Progress with internal pelvic floor assessment at next session.   Examination-Activity Limitations Continence    Stability/Clinical Decision Making Evolving/Moderate complexity    Rehab Potential Good    PT Frequency 1x / week    PT Duration Other (comment)   10   PT Treatment/Interventions Gait training;Stair training;Functional mobility training;Moist Heat;Therapeutic activities;Therapeutic exercise;Cryotherapy;Traction;Patient/family education;Neuromuscular re-education;Manual techniques;Balance training;Passive range of motion;Scar mobilization;Taping;Compression bandaging;Spinal Manipulations;Joint Manipulations    Consulted and Agree with Plan of Care Patient             Patient will benefit from skilled therapeutic intervention in order to improve the following deficits and impairments:  Decreased coordination, Decreased mobility, Decreased scar mobility, Increased muscle spasms, Decreased endurance, Decreased activity tolerance, Decreased balance, Difficulty walking, Decreased strength, Improper body mechanics, Postural dysfunction, Hypomobility, Increased fascial restricitons, Pain, Abnormal gait, Impaired flexibility, Impaired vision/preception  Visit Diagnosis: Sacrococcygeal disorders, not elsewhere classified  Other lack of coordination  Retention of urine  Other abnormalities of gait and mobility  Chronic pain of left knee  Chronic pain of right knee     Problem List Patient Active Problem List   Diagnosis Date Noted   Status post total hip replacement, right 01/22/2021   Right hip pain 01/27/2018   Lumbar spinal stenosis 01/27/2018   Gluteal tendinitis, right hip 01/27/2018   Calculus of  gallbladder with acute cholecystitis without obstruction 08/04/2017   Osteopenia 05/18/2015   METATARSALGIA 08/14/2008   PLANTAR FASCIITIS, RIGHT 08/14/2008   FOOT PAIN, RIGHT 08/14/2008    Jerl Mina, PT 08/12/2021, 6:24 PM  [place] [facility] MAIN [facility] SERVICES [address] [place], [place], [address] Phone: [phone]   Fax:  [phone]  Name: Kathryn Hawkins MRN: [id number] Date of Birth: [birth year]

## 2021-08-15 ENCOUNTER — Ambulatory Visit: Payer: PPO | Admitting: Physical Therapy

## 2021-08-21 ENCOUNTER — Ambulatory Visit: Payer: PPO | Admitting: Physical Therapy

## 2021-08-29 ENCOUNTER — Ambulatory Visit: Payer: PPO | Admitting: Physical Therapy

## 2021-08-29 ENCOUNTER — Other Ambulatory Visit: Payer: Self-pay

## 2021-08-29 DIAGNOSIS — M533 Sacrococcygeal disorders, not elsewhere classified: Secondary | ICD-10-CM

## 2021-08-29 DIAGNOSIS — R278 Other lack of coordination: Secondary | ICD-10-CM

## 2021-08-29 DIAGNOSIS — M25561 Pain in right knee: Secondary | ICD-10-CM

## 2021-08-29 DIAGNOSIS — R2689 Other abnormalities of gait and mobility: Secondary | ICD-10-CM

## 2021-08-29 DIAGNOSIS — R339 Retention of urine, unspecified: Secondary | ICD-10-CM

## 2021-08-29 DIAGNOSIS — G8929 Other chronic pain: Secondary | ICD-10-CM

## 2021-08-29 NOTE — Therapy (Signed)
[place] MAIN [facility] SERVICES [address] [place], [place], [address] Phone: [phone]   Fax:  [phone]  Physical Therapy Treatment  Patient Details  Name: Kathryn Hawkins MRN: [id number] Date of Birth: 1955/11/06 Referring Provider (PT): Benjaman Kindler   Encounter Date: 08/29/2021   PT End of Session - 08/29/21 1558     Visit Number 8    Number of Visits 10    Date for PT Re-Evaluation 09/12/21    PT Start Time 5573    PT Stop Time 1706    PT Time Calculation (min) 62 min    Activity Tolerance No increased pain;Patient tolerated treatment well    Behavior During Therapy [facility] for tasks assessed/performed             Past Medical History:  Diagnosis Date   DDD (degenerative disc disease), lumbar 2017   Diverticulitis    Dyspareunia in female    Herniated disc, cervical    Lumbar spinal stenosis    Melanoma ([place]) 1994   Left leg. Level II, per patient   Osteopenia 2016   Vaginal dryness    Vulvar atrophy     Past Surgical History:  Procedure Laterality Date   ABDOMINAL HYSTERECTOMY  1994   with bladder tack   BLADDER SUSPENSION     BREAST BIOPSY Right 03/24/2019   [place] bx coil clip BENIGN BREAST TISSUE WITH PSEUDO-ANGIOMATOUS STROMAL HYPERPLASIA, USUAL DUCTAL HYPERPLASIA AND FOCAL FIBROADENOMATOID CHANGES   BREAST BIOPSY Right 03/21/2021   [place] bx mass, coil marker, path pending   BREAST BIOPSY Left 03/21/2021   stereo bx asymmetry, x marker, path pending   CHOLECYSTECTOMY N/A 08/05/2017   Procedure: LAPAROSCOPIC CHOLECYSTECTOMY;  Surgeon: Vickie Epley, MD;  Location: [facility] ORS;  Service: General;  Laterality: N/A;   COLONOSCOPY     2008, 2018   CYSTOURETHROSCOPY  01/2014   urethrolysis transvaginal   FLEXIBLE SIGMOIDOSCOPY  1994   KELOID EXCISION Right 1974   LAPAROSCOPIC OVARIAN CYSTECTOMY Right 1994   MELANOMA EXCISION Left    leg   TOTAL HIP ARTHROPLASTY Right 01/22/2021   Procedure: TOTAL HIP  ARTHROPLASTY ANTERIOR APPROACH;  Surgeon: Hessie Knows, MD;  Location: [facility] ORS;  Service: Orthopedics;  Laterality: Right;   TUBAL LIGATION      There were no vitals filed for this visit.   Subjective Assessment - 08/29/21 1513     Subjective Pt reported her low back is better but if she sits too much, it hurts. Pt sneezed in the grocery store and she had a little leakage. pt notices her prolapse with bending, moving in bed    Patient Stated Goals compeltely empty bladder and strengthn her core                [facility] PT Assessment - 08/29/21 1609       Coordination   Coordination and Movement Description chest breathing, poor diassociation between trunk and pelvis ( improved with cues for multifidis HEP)                        Pelvic Floor Special Questions - 08/29/21 1750     Pelvic Floor Internal Exam pt consented verbally and also had contraindications    Exam Type Vaginal    Palpation downward descent of urethra with coughing, tenderness when pelvis was in posterior tilt, less tenderenss in anterior tilt and more circumferential and upward lift of anterior mm when cued  [place] Adult PT Treatment/Exercise - 08/29/21 1809       Therapeutic Activites    Other Therapeutic Activities explained anatomy/ physiology, internal pelvic assessment      Neuro Re-ed    Neuro Re-ed Details  cued for multidifis HEP, anterior pelvic tilt and less ab overuse, more upward movement of pelvic floor      Moist Heat Therapy   Number Minutes Moist Heat 5 Minutes    Moist Heat Location Other (comment)   perineum ( butterfly pose) unbilled     Manual Therapy   Internal Pelvic Floor tactile cues and STM/MWM at pubic symphysis attachments to promote upward lift of pelvic floor                          PT Long Term Goals - 08/06/21 1552       PT LONG TERM GOAL #1   Title Pt will report improved Stool consistency Type 4 across 50% of the  time instead of 25% of the time with increased water intake from,16 fl oz to 32 fl oz. in order to minimize straining her pelvic floor with bowel movements    Time 8    Period Weeks    Status On-going    Target Date 08/29/21      PT LONG TERM GOAL #2   Title Pt will demo proper deep core mm coordination with proper lengthening of pelvic floor in order to minimize waiting time to completely empty bladder from 5 min to < 1 min    Time 4    Period Weeks    Status On-going    Target Date 08/01/21      PT LONG TERM GOAL #3   Title Pt will demo proper body mechanics to minimzie straining pelvic floor/ abdomen in order to improve Sx    Time 2    Period Weeks    Status On-going    Target Date 07/18/21      PT LONG TERM GOAL #4   Title Pt will demo proper coccyx and SIJ mobility and alignment and be able to sit on hard bench without a pillow while eating with family at restaurant. for 30 min    Time 6    Period Weeks    Status Achieved    Target Date 08/15/21      PT LONG TERM GOAL #5   Title Pt will demo no more higher L iliac crest  and demo no pain with R sideflexion across 2 visits in order to progress to deep core training    Time 4    Period Weeks    Status Achieved    Target Date 08/01/21      Additional Long Term Goals   Additional Long Term Goals Yes      PT LONG TERM GOAL #6   Title Pt will improve FOTO lumbar score from 60 pts to > 64 pts in order to stand and bend for ADLs    Time 10    Period Weeks    Status On-going    Target Date 09/12/21      PT LONG TERM GOAL #7   Title Pt wil improved Urinary Problem score on FOTO from 57 pts to > 62 pts and Bowel Constipation from 55 pts to > 60 pts in order to improve QOL    Time 10    Period Weeks    Status On-going    Target Date  09/12/21      PT LONG TERM GOAL #8   Title Pt will demo proper alignment with squat and report no knee pain in order to perform lifting objects off floor    Time 5    Status On-going     Target Date 08/08/21      PT LONG TERM GOAL  #9   TITLE --    Baseline --    Time --    Period --    Status --    Target Date --                   Plan - 08/29/21 1559     Clinical Impression Statement Assessment of pelvic floor showed posterior tilt of pelvis with poor activation of pelvic floor and tightness of mm at pubic symphysis. Following manual Tx and cues, pt demo'd improved upward movement of pelvic floor. Pt learned propioception of anterior tilt of pelvis for upward movemetn of pelvic floor. Pt also learned diassociation of trunk and pelvis for multifidis mm after tactile and verbal cues. Plan to continue with pelvic floor coordination and training to minimize worsening of prolapse. Pt continues to benefit from skilled PT.   Examination-Activity Limitations Continence    Stability/Clinical Decision Making Evolving/Moderate complexity    Rehab Potential Good    PT Frequency 1x / week    PT Duration Other (comment)   10   PT Treatment/Interventions Gait training;Stair training;Functional mobility training;Moist Heat;Therapeutic activities;Therapeutic exercise;Cryotherapy;Traction;Patient/family education;Neuromuscular re-education;Manual techniques;Balance training;Passive range of motion;Scar mobilization;Taping;Compression bandaging;Spinal Manipulations;Joint Manipulations    Consulted and Agree with Plan of Care Patient             Patient will benefit from skilled therapeutic intervention in order to improve the following deficits and impairments:  Decreased coordination, Decreased mobility, Decreased scar mobility, Increased muscle spasms, Decreased endurance, Decreased activity tolerance, Decreased balance, Difficulty walking, Decreased strength, Improper body mechanics, Postural dysfunction, Hypomobility, Increased fascial restricitons, Pain, Abnormal gait, Impaired flexibility, Impaired vision/preception  Visit Diagnosis: No diagnosis found.     Problem  List Patient Active Problem List   Diagnosis Date Noted   Status post total hip replacement, right 01/22/2021   Right hip pain 01/27/2018   Lumbar spinal stenosis 01/27/2018   Gluteal tendinitis, right hip 01/27/2018   Calculus of gallbladder with acute cholecystitis without obstruction 08/04/2017   Osteopenia 05/18/2015   METATARSALGIA 08/14/2008   PLANTAR FASCIITIS, RIGHT 08/14/2008   FOOT PAIN, RIGHT 08/14/2008    Jerl Mina, PT 08/29/2021, 6:12 PM  [place] MAIN [facility] SERVICES [address] [place], [place], [address] Phone: [phone]   Fax:  [phone]  Name: Kathryn Hawkins MRN: [id number] Date of Birth: 09/25/1955

## 2021-08-29 NOTE — Patient Instructions (Signed)
Multifidis twist  Band is on doorknob: stand further away from door (facing perpendicular)   Twisting trunk without moving the hips and knees Hold band at the level of ribcage, elbows bent,shoulder blades roll back and down like squeezing a pencil under armpit    Exhale twist,.10-15 deg away from door without moving your hips/ knees. Continue to maintain equal weight through legs. Keep knee unlocked.  20 reps  __  Pelvic tilt anterior direction , feet firm, before deep core series

## 2021-09-05 ENCOUNTER — Ambulatory Visit: Payer: PPO | Admitting: Physical Therapy

## 2021-09-10 ENCOUNTER — Other Ambulatory Visit: Payer: Self-pay

## 2021-09-10 ENCOUNTER — Ambulatory Visit: Payer: PPO | Attending: Obstetrics and Gynecology | Admitting: Physical Therapy

## 2021-09-10 DIAGNOSIS — R339 Retention of urine, unspecified: Secondary | ICD-10-CM | POA: Insufficient documentation

## 2021-09-10 DIAGNOSIS — M25561 Pain in right knee: Secondary | ICD-10-CM | POA: Diagnosis not present

## 2021-09-10 DIAGNOSIS — M25562 Pain in left knee: Secondary | ICD-10-CM | POA: Insufficient documentation

## 2021-09-10 DIAGNOSIS — G8929 Other chronic pain: Secondary | ICD-10-CM | POA: Diagnosis not present

## 2021-09-10 DIAGNOSIS — R2689 Other abnormalities of gait and mobility: Secondary | ICD-10-CM | POA: Diagnosis not present

## 2021-09-10 DIAGNOSIS — R278 Other lack of coordination: Secondary | ICD-10-CM | POA: Diagnosis not present

## 2021-09-10 DIAGNOSIS — M533 Sacrococcygeal disorders, not elsewhere classified: Secondary | ICD-10-CM | POA: Diagnosis not present

## 2021-09-10 NOTE — Therapy (Addendum)
[place] MAIN [facility] SERVICES [address] [place], [place], [address] Phone: [phone]   Fax:  [phone]  Physical Therapy Treatment  Patient Details  Name: Kathryn Hawkins MRN: [id number] Date of Birth: 22-Aug-1955 Referring Provider (PT): Benjaman Kindler   Encounter Date: 09/10/2021   PT End of Session - 09/10/21 1613     Visit Number 9    Number of Visits 10    Date for PT Re-Evaluation 09/12/21    PT Start Time 4034    PT Stop Time 7425    PT Time Calculation (min) 58 min    Activity Tolerance No increased pain;Patient tolerated treatment well    Behavior During Therapy [facility] for tasks assessed/performed             Past Medical History:  Diagnosis Date   DDD (degenerative disc disease), lumbar 2017   Diverticulitis    Dyspareunia in female    Herniated disc, cervical    Lumbar spinal stenosis    Melanoma ([place]) 1994   Left leg. Level II, per patient   Osteopenia 2016   Vaginal dryness    Vulvar atrophy     Past Surgical History:  Procedure Laterality Date   ABDOMINAL HYSTERECTOMY  1994   with bladder tack   BLADDER SUSPENSION     BREAST BIOPSY Right 03/24/2019   [place] bx coil clip BENIGN BREAST TISSUE WITH PSEUDO-ANGIOMATOUS STROMAL HYPERPLASIA, USUAL DUCTAL HYPERPLASIA AND FOCAL FIBROADENOMATOID CHANGES   BREAST BIOPSY Right 03/21/2021   [place] bx mass, coil marker, path pending   BREAST BIOPSY Left 03/21/2021   stereo bx asymmetry, x marker, path pending   CHOLECYSTECTOMY N/A 08/05/2017   Procedure: LAPAROSCOPIC CHOLECYSTECTOMY;  Surgeon: Vickie Epley, MD;  Location: [facility] ORS;  Service: General;  Laterality: N/A;   COLONOSCOPY     2008, 2018   CYSTOURETHROSCOPY  01/2014   urethrolysis transvaginal   FLEXIBLE SIGMOIDOSCOPY  1994   KELOID EXCISION Right 1974   LAPAROSCOPIC OVARIAN CYSTECTOMY Right 1994   MELANOMA EXCISION Left    leg   TOTAL HIP ARTHROPLASTY Right 01/22/2021   Procedure: TOTAL HIP  ARTHROPLASTY ANTERIOR APPROACH;  Surgeon: Hessie Knows, MD;  Location: [facility] ORS;  Service: Orthopedics;  Laterality: Right;   TUBAL LIGATION      There were no vitals filed for this visit.   Subjective Assessment - 09/10/21 1613     Subjective Pt reported she had the  best pee with full bladder empty . pt has had some improvement with less trips to the bathroom at night . Pt had COVID and had to cancel her appt last week. Pt had mild Sx   Patient Stated Goals compeltely empty bladder and strengthn her core                [facility] PT Assessment - 09/10/21 1830       Floor to Stand   Comments mobility for downward dog method present with no difficulty but hand placement showed lijmited thumb abduction/ wrist ext R      AROM   Overall AROM Comments thumb abduction R 40 deg preTx, 60 deg post Tx      Palpation   Palpation comment hypomobile carpal joints , arthritis                           [facility] Adult PT Treatment/Exercise - 09/10/21 1832       Therapeutic Activites  Other Therapeutic Activities provided referrals to yoga class and [facility]      Neuro Re-ed    Neuro Re-ed Details  cued for proper technique floor <> stand, multiple trials      Moist Heat Therapy   Number Minutes Moist Heat 5 Minutes    Moist Heat Location Wrist      Manual Therapy   Manual therapy comments Distraction, AP/PA mob GradeI-II, STM/MWM to promote R wrist extension to promote WBing for stable floor <> stand t/f and to start integrating pt to yoga practice on the floor                          PT Long Term Goals - 09/10/21 1836       PT LONG TERM GOAL #1   Title Pt will report improved Stool consistency Type 4 across 50% of the time instead of 25% of the time with increased water intake from,16 fl oz to 32 fl oz. in order to minimize straining her pelvic floor with bowel movements    Time 8    Period Weeks    Status On-going    Target Date 08/29/21      PT  LONG TERM GOAL #2   Title Pt will demo proper deep core mm coordination with proper lengthening of pelvic floor in order to minimize waiting time to completely empty bladder from 5 min to < 1 min    Time 4    Period Weeks    Status On-going    Target Date 08/01/21      PT LONG TERM GOAL #3   Title Pt will demo proper body mechanics to minimzie straining pelvic floor/ abdomen in order to improve Sx    Time 2    Period Weeks    Status On-going    Target Date 07/18/21      PT LONG TERM GOAL #4   Title Pt will demo proper coccyx and SIJ mobility and alignment and be able to sit on hard bench without a pillow while eating with family at restaurant. for 30 min    Time 6    Period Weeks    Status Achieved    Target Date 08/15/21      PT LONG TERM GOAL #5   Title Pt will demo no more higher L iliac crest  and demo no pain with R sideflexion across 2 visits in order to progress to deep core training    Time 4    Period Weeks    Status Achieved    Target Date 08/01/21      PT LONG TERM GOAL #6   Title Pt will improve FOTO lumbar score from 60 pts to > 64 pts in order to stand and bend for ADLs    Time 10    Period Weeks    Status On-going    Target Date 09/12/21      PT LONG TERM GOAL #7   Title Pt wil improved Urinary Problem score on FOTO from 57 pts to > 62 pts and Bowel Constipation from 55 pts to > 60 pts in order to improve QOL    Time 10    Period Weeks    Status On-going    Target Date 09/12/21      PT LONG TERM GOAL #8   Title Pt will demo proper alignment with squat and report no knee pain in order to perform lifting objects off  floor    Time 5    Status On-going    Target Date 08/08/21                   Plan - 09/10/21 1835     Clinical Impression Statement Pt had COVID and had to cancel her appt last week. Pt had mild Sx  Pt demo'd proper technique with t/f from floor <> stand after training. Pt required manual Tx at R wrist to promote more thumb ext/  wrist ext to weight bear more on hands for more stable t/f to stand from floor and to prepare pt to return to yoga practice on the floor. Pt demo'd improved hand placement with less risk for injuries at upper kinetic chain.   Pt is making great progress with report of experiencing complete emptying of urination this week and nocturia has decreased to 1x night. Plan to do a progress note and recert at next session. Pt continues to benenfit from skilled PT to achieve goals.   Examination-Activity Limitations Continence    Stability/Clinical Decision Making Evolving/Moderate complexity    Rehab Potential Good    PT Frequency 1x / week    PT Duration Other (comment)   10   PT Treatment/Interventions Gait training;Stair training;Functional mobility training;Moist Heat;Therapeutic activities;Therapeutic exercise;Cryotherapy;Traction;Patient/family education;Neuromuscular re-education;Manual techniques;Balance training;Passive range of motion;Scar mobilization;Taping;Compression bandaging;Spinal Manipulations;Joint Manipulations    Consulted and Agree with Plan of Care Patient             Patient will benefit from skilled therapeutic intervention in order to improve the following deficits and impairments:  Decreased coordination, Decreased mobility, Decreased scar mobility, Increased muscle spasms, Decreased endurance, Decreased activity tolerance, Decreased balance, Difficulty walking, Decreased strength, Improper body mechanics, Postural dysfunction, Hypomobility, Increased fascial restricitons, Pain, Abnormal gait, Impaired flexibility, Impaired vision/preception  Visit Diagnosis: Sacrococcygeal disorders, not elsewhere classified  Other lack of coordination  Retention of urine  Other abnormalities of gait and mobility  Chronic pain of left knee  Chronic pain of right knee     Problem List Patient Active Problem List   Diagnosis Date Noted   Status post total hip replacement, right  01/22/2021   Right hip pain 01/27/2018   Lumbar spinal stenosis 01/27/2018   Gluteal tendinitis, right hip 01/27/2018   Calculus of gallbladder with acute cholecystitis without obstruction 08/04/2017   Osteopenia 05/18/2015   METATARSALGIA 08/14/2008   PLANTAR FASCIITIS, RIGHT 08/14/2008   FOOT PAIN, RIGHT 08/14/2008    Jerl Mina, PT 09/10/2021, 6:36 PM  [place] MAIN [facility] SERVICES [address] [place], [place], [address] Phone: [phone]   Fax:  [phone]  Name: Kathryn Hawkins MRN: [id number] Date of Birth: [birth year]

## 2021-09-17 ENCOUNTER — Other Ambulatory Visit: Payer: Self-pay

## 2021-09-17 ENCOUNTER — Ambulatory Visit: Payer: PPO | Admitting: Physical Therapy

## 2021-09-17 DIAGNOSIS — R2689 Other abnormalities of gait and mobility: Secondary | ICD-10-CM

## 2021-09-17 DIAGNOSIS — R278 Other lack of coordination: Secondary | ICD-10-CM

## 2021-09-17 DIAGNOSIS — R339 Retention of urine, unspecified: Secondary | ICD-10-CM

## 2021-09-17 DIAGNOSIS — M533 Sacrococcygeal disorders, not elsewhere classified: Secondary | ICD-10-CM | POA: Diagnosis not present

## 2021-09-17 DIAGNOSIS — G8929 Other chronic pain: Secondary | ICD-10-CM

## 2021-09-17 NOTE — Therapy (Signed)
[place] MAIN [facility] SERVICES [address] [place], [place], [address] Phone: [phone]   Fax:  [phone]  Physical Therapy Treatment / progress Note reporting from 07/04/21 to 09/17/21 10 visits   Patient Details  Name: Kathryn Hawkins MRN: [id number] Date of Birth: 1956/01/12 Referring Provider (PT): Benjaman Kindler   Encounter Date: 09/17/2021   PT End of Session - 09/17/21 1410     Visit Number 10    Date for PT Re-Evaluation 11/26/21   PN 09/17/21   PT Start Time 1406    PT Stop Time 1508    PT Time Calculation (min) 62 min    Activity Tolerance No increased pain;Patient tolerated treatment well    Behavior During Therapy [facility] for tasks assessed/performed             Past Medical History:  Diagnosis Date   DDD (degenerative disc disease), lumbar 2017   Diverticulitis    Dyspareunia in female    Herniated disc, cervical    Lumbar spinal stenosis    Melanoma ([place]) 1994   Left leg. Level II, per patient   Osteopenia 2016   Vaginal dryness    Vulvar atrophy     Past Surgical History:  Procedure Laterality Date   ABDOMINAL HYSTERECTOMY  1994   with bladder tack   BLADDER SUSPENSION     BREAST BIOPSY Right 03/24/2019   [place] bx coil clip BENIGN BREAST TISSUE WITH PSEUDO-ANGIOMATOUS STROMAL HYPERPLASIA, USUAL DUCTAL HYPERPLASIA AND FOCAL FIBROADENOMATOID CHANGES   BREAST BIOPSY Right 03/21/2021   [place] bx mass, coil marker, path pending   BREAST BIOPSY Left 03/21/2021   stereo bx asymmetry, x marker, path pending   CHOLECYSTECTOMY N/A 08/05/2017   Procedure: LAPAROSCOPIC CHOLECYSTECTOMY;  Surgeon: Vickie Epley, MD;  Location: [facility] ORS;  Service: General;  Laterality: N/A;   COLONOSCOPY     2008, 2018   CYSTOURETHROSCOPY  01/2014   urethrolysis transvaginal   FLEXIBLE SIGMOIDOSCOPY  1994   KELOID EXCISION Right 1974   LAPAROSCOPIC OVARIAN CYSTECTOMY Right 1994   MELANOMA EXCISION Left    leg   TOTAL HIP ARTHROPLASTY  Right 01/22/2021   Procedure: TOTAL HIP ARTHROPLASTY ANTERIOR APPROACH;  Surgeon: Hessie Knows, MD;  Location: [facility] ORS;  Service: Orthopedics;  Laterality: Right;   TUBAL LIGATION      There were no vitals filed for this visit.   Subjective Assessment - 09/17/21 1411     Subjective pt reported she notices it getting a little better with getting the residual urine out without straining. Pt reported her R thumb felt better with pressing on the floor during the floor to stand transfer. But the thumb is still painful when wiping bowel movement    Patient Stated Goals compeltely empty bladder and strengthn her core                [facility] PT Assessment - 09/17/21 1459       Palpation   Palpation comment tightness at supinator/ abd longus brevis                        Pelvic Floor Special Questions - 09/17/21 1500     Pelvic Floor Internal Exam pt consented verbally and also had contraindications    Exam Type Vaginal    Palpation tightness and tenderness at 6-8 oclock , overall limited lengthening               [facility] Adult  PT Treatment/Exercise - 09/17/21 1501       Therapeutic Activites    Other Therapeutic Activities readminstered FOTO and reassessed goals      Neuro Re-ed    Neuro Re-ed Details  cued anterior tilt of pelvis before point of back pain, bridging up to lengthen back, and propioception of pelvic tilt for optimal lengthen of pelvic floor      Manual Therapy   Internal Pelvic Floor STM/MWM at forearm mm noted in assessment to continue to promote thumb abduction to achieve her goals for floot to stand t/f  and less pain with wiping BMs                          PT Long Term Goals - 09/17/21 1411       PT LONG TERM GOAL #1   Title Pt will report improved Stool consistency Type 4 across 50% of the time instead of 25% of the time with increased water intake from,16 fl oz to 32 fl oz. in order to minimize straining her pelvic  floor with bowel movements    Time 8    Period Weeks    Status Achieved    Target Date 08/29/21      PT LONG TERM GOAL #2   Title Pt will demo proper deep core mm coordination with proper lengthening of pelvic floor in order to minimize waiting time to completely empty bladder from 5 min to < 1 min  ( 09/17/21: 3 min)    Time 4    Period Weeks    Status Partially Met    Target Date 08/01/21      PT LONG TERM GOAL #3   Title Pt will demo proper body mechanics to minimzie straining pelvic floor/ abdomen in order to improve Sx    Time 2    Period Weeks    Status On-going    Target Date 07/18/21      PT LONG TERM GOAL #4   Title Pt will demo proper coccyx and SIJ mobility and alignment and be able to sit on hard bench without a pillow while eating with family at restaurant. for 30 min    Time 6    Period Weeks    Status Achieved    Target Date 08/15/21      PT LONG TERM GOAL #5   Title Pt will demo no more higher L iliac crest  and demo no pain with R sideflexion across 2 visits in order to progress to deep core training    Time 4    Period Weeks    Status Achieved    Target Date 08/01/21      Additional Long Term Goals   Additional Long Term Goals Yes      PT LONG TERM GOAL #6   Title Pt will improve FOTO lumbar score from 60 pts to > 64 pts in order to stand and bend for ADLs ( 2/14/ 23 : 69 pts)    Time 10    Period Weeks    Status Achieved    Target Date 09/12/21      PT LONG TERM GOAL #7   Title Pt wil improved Urinary Problem score on FOTO from 57 pts to > 62 pts  ( 09/17/21:  61 pts)   and Bowel Constipation from 55 pts to > 60 pts ( 09/17/21: 67 pts)  in order to improve QOL    Time 10  Period Weeks    Status Achieved    Target Date 09/12/21      PT LONG TERM GOAL #8   Title Pt will demo proper alignment with squat and report no knee pain in order to perform lifting objects off floor    Time 5    Status Achieved    Target Date 08/08/21      PT LONG TERM  GOAL  #9   TITLE Pt will report being able to empty out completely when urinating without residual 75% of the time    Time 10    Period Weeks    Status New    Target Date 11/26/21      PT LONG TERM GOAL  #10   TITLE Pt demo increased thumb abduction from 40 deg to 60 deg ( L thumb 60 deg) in order to put weight on wrists and hand to get up from floor and also to hve no pain with wiping after BMs    Time 10    Period Weeks    Status New    Target Date 11/26/21                   Plan - 09/17/21 1504     Clinical Impression Statement Pt has achieved 6/10 goals. Pt has less difficulty with initiating urination by 50% with less delay and also with less straining. Pt's LBP has improved along with her FOTO score.  Pt has significantly decreased lumbar pain, improved deep core coordination/strength, improved pelvic and spinal alignment.  Pt also demo increased SIJ mobility. These indicates improved IAP system. Pt requires more internal pelvic floor Tx as decreased lengthening of mm was evident today with internal assessment. Pt demo'd improved lengthening of pelvic floor which will help with urination and improve complete emptying. Pt also required manual Tx to address limited thumb abduction which impacts her ability WB on her hands for floor to  stand t/f and to wipe after BMs. Pt had a fall at the beginning of [facility] at a store and had difficulty getting up from the floor. She demonstrated more IND with floor to stand technique in the past visits.   Having less pain with personal hygiene after BMs is important for overall pelvic health. Today, pt required cues today for pelvic tilt propioception to help with optimal lengthening of pelvic floor with urination. Plan to continue to address pelvic floor next session. Pt's knee pain is getting addressed with cues required today with proper alignment with squats to help with bending down to pick up objects. Pt continues to  benefit from skilled PT.       Examination-Activity Limitations Continence    Stability/Clinical Decision Making Evolving/Moderate complexity    Rehab Potential Good    PT Frequency 1x / week    PT Duration Other (comment)   10   PT Treatment/Interventions Gait training;Stair training;Functional mobility training;Moist Heat;Therapeutic activities;Therapeutic exercise;Cryotherapy;Traction;Patient/family education;Neuromuscular re-education;Manual techniques;Balance training;Passive range of motion;Scar mobilization;Taping;Compression bandaging;Spinal Manipulations;Joint Manipulations    Consulted and Agree with Plan of Care Patient             Patient will benefit from skilled therapeutic intervention in order to improve the following deficits and impairments:  Decreased coordination, Decreased mobility, Decreased scar mobility, Increased muscle spasms, Decreased endurance, Decreased activity tolerance, Decreased balance, Difficulty walking, Decreased strength, Improper body mechanics, Postural dysfunction, Hypomobility, Increased fascial restricitons, Pain, Abnormal gait, Impaired flexibility, Impaired vision/preception  Visit Diagnosis: Sacrococcygeal disorders, not elsewhere classified  Retention of urine  Other lack of coordination  Other abnormalities of gait and mobility  Chronic pain of left knee  Chronic pain of right knee     Problem List Patient Active Problem List   Diagnosis Date Noted   Status post total hip replacement, right 01/22/2021   Right hip pain 01/27/2018   Lumbar spinal stenosis 01/27/2018   Gluteal tendinitis, right hip 01/27/2018   Calculus of gallbladder with acute cholecystitis without obstruction 08/04/2017   Osteopenia 05/18/2015   METATARSALGIA 08/14/2008   PLANTAR FASCIITIS, RIGHT 08/14/2008   FOOT PAIN, RIGHT 08/14/2008    Jerl Mina, PT 09/17/2021, 4:47 PM  [place] MAIN [facility] SERVICES [address]  [place], [place], [address] Phone: [phone]   Fax:  [phone]  Name: CHARELL FAULK MRN: [id number] Date of Birth: Feb 12, 1956

## 2021-09-17 NOTE — Patient Instructions (Addendum)
Find pelvic tilt within small range before back pain  To create optimal lengthening of pelvic floor   __  Continue pelvic floor stretches   Stretch for pelvic floor   V- slides  "v heels slide away and then back toward buttocks and then rock knee to slight ,  slide heel along at 11 o clock away from buttocks   10 reps      Mermaid stretch  Rocking while seated on the floor with heels to one side of the hip Heels to one side of the hip  Rock forward towards the knee that is bent , rock beck towards the opposite sitting bones

## 2021-09-25 ENCOUNTER — Encounter: Payer: PPO | Admitting: Dermatology

## 2021-10-10 ENCOUNTER — Ambulatory Visit: Payer: PPO | Attending: Obstetrics and Gynecology | Admitting: Physical Therapy

## 2021-10-10 ENCOUNTER — Ambulatory Visit: Payer: PPO | Admitting: Dermatology

## 2021-10-10 ENCOUNTER — Other Ambulatory Visit: Payer: Self-pay

## 2021-10-10 DIAGNOSIS — M533 Sacrococcygeal disorders, not elsewhere classified: Secondary | ICD-10-CM | POA: Diagnosis not present

## 2021-10-10 DIAGNOSIS — Z1283 Encounter for screening for malignant neoplasm of skin: Secondary | ICD-10-CM | POA: Diagnosis not present

## 2021-10-10 DIAGNOSIS — R278 Other lack of coordination: Secondary | ICD-10-CM | POA: Diagnosis not present

## 2021-10-10 DIAGNOSIS — L578 Other skin changes due to chronic exposure to nonionizing radiation: Secondary | ICD-10-CM

## 2021-10-10 DIAGNOSIS — D229 Melanocytic nevi, unspecified: Secondary | ICD-10-CM

## 2021-10-10 DIAGNOSIS — R2689 Other abnormalities of gait and mobility: Secondary | ICD-10-CM | POA: Diagnosis not present

## 2021-10-10 DIAGNOSIS — L219 Seborrheic dermatitis, unspecified: Secondary | ICD-10-CM | POA: Diagnosis not present

## 2021-10-10 DIAGNOSIS — M25562 Pain in left knee: Secondary | ICD-10-CM | POA: Diagnosis not present

## 2021-10-10 DIAGNOSIS — M25561 Pain in right knee: Secondary | ICD-10-CM | POA: Diagnosis not present

## 2021-10-10 DIAGNOSIS — R339 Retention of urine, unspecified: Secondary | ICD-10-CM | POA: Diagnosis not present

## 2021-10-10 DIAGNOSIS — L719 Rosacea, unspecified: Secondary | ICD-10-CM

## 2021-10-10 DIAGNOSIS — G8929 Other chronic pain: Secondary | ICD-10-CM | POA: Diagnosis not present

## 2021-10-10 DIAGNOSIS — L814 Other melanin hyperpigmentation: Secondary | ICD-10-CM | POA: Diagnosis not present

## 2021-10-10 DIAGNOSIS — L821 Other seborrheic keratosis: Secondary | ICD-10-CM | POA: Diagnosis not present

## 2021-10-10 DIAGNOSIS — L738 Other specified follicular disorders: Secondary | ICD-10-CM | POA: Diagnosis not present

## 2021-10-10 DIAGNOSIS — D1800 Hemangioma unspecified site: Secondary | ICD-10-CM

## 2021-10-10 DIAGNOSIS — B353 Tinea pedis: Secondary | ICD-10-CM

## 2021-10-10 DIAGNOSIS — Z79899 Other long term (current) drug therapy: Secondary | ICD-10-CM | POA: Diagnosis not present

## 2021-10-10 DIAGNOSIS — Z85820 Personal history of malignant melanoma of skin: Secondary | ICD-10-CM

## 2021-10-10 MED ORDER — TAVABOROLE 5 % EX SOLN: CUTANEOUS | 2 refills | Status: DC

## 2021-10-10 MED ORDER — KETOCONAZOLE 2 % EX SHAM: 1.0000 "application " | MEDICATED_SHAMPOO | Freq: Once | CUTANEOUS | 6 refills | Status: AC

## 2021-10-10 NOTE — Progress Notes (Unsigned)
Follow-Up Visit   Subjective  Kathryn Hawkins is a 66 y.o. female who presents for the following: Annual Exam (Mole ). Yearly mole check hx of Melanoma on the left leg in 1994 The patient presents for Total-Body Skin Exam (TBSE) for skin cancer screening and mole check.  The patient has spots, moles and lesions to be evaluated, some may be new or changing and the patient has concerns that these could be cancer.    The following portions of the chart were reviewed this encounter and updated as appropriate:       Review of Systems:  No other skin or systemic complaints except as noted in HPI or Assessment and Plan.  Objective  Well appearing patient in no apparent distress; mood and affect are within normal limits.  A full examination was performed including scalp, head, eyes, ears, nose, lips, neck, chest, axillae, abdomen, back, buttocks, bilateral upper extremities, bilateral lower extremities, hands, feet, fingers, toes, fingernails, and toenails. All findings within normal limits unless otherwise noted below.  Head - Anterior (Face) Mid face erythema with telangiectasias +/- scattered inflammatory papules.   Nose Yellowish papule  right nasal labia, scalp Pink patches with greasy scale.     Assessment & Plan  Rosacea Head - Anterior (Face)  Rosacea is a chronic progressive skin condition usually affecting the face of adults, causing redness and/or acne bumps. It is treatable but not curable. It sometimes affects the eyes (ocular rosacea) as well. It may respond to topical and/or systemic medication and can flare with stress, sun exposure, alcohol, exercise and some foods.  Daily application of broad spectrum spf 30+ sunscreen to face is recommended to reduce flares.   Start Skin medicinals Rosacea triple cream apply to face daily   Sebaceous hyperplasia Nose  Observe   Seborrheic dermatitis right nasal labia, scalp  Seborrheic Dermatitis  -  is a chronic  persistent rash characterized by pinkness and scaling most commonly of the mid face but also can occur on the scalp (dandruff), ears; mid chest, mid back and groin.  It tends to be exacerbated by stress and cooler weather.  People who have neurologic disease may experience new onset or exacerbation of existing seborrheic dermatitis.  The condition is not curable but treatable and can be controlled.   Related Medications ketoconazole (NIZORAL) 2 % shampoo Apply 1 application. topically once for 1 dose. apply three times per week, massage into scalp and leave in for 10 minutes before  Tinea pedis of both feet Right Foot - Anterior  Tinea pedis/Tinea unguium Start Kerydin apply to toenails at bedtime   Related Medications Tavaborole (KERYDIN) 5 % SOLN Apply to toenails at bedtime   Lentigines - Scattered tan macules - Due to sun exposure - Benign-appearing, observe - Recommend daily broad spectrum sunscreen SPF 30+ to sun-exposed areas, reapply every 2 hours as needed. - Call for any changes  Seborrheic Keratoses - Stuck-on, waxy, tan-brown papules and/or plaques  - Benign-appearing - Discussed benign etiology and prognosis. - Observe - Call for any changes  Melanocytic Nevi - Tan-brown and/or pink-flesh-colored symmetric macules and papules - Benign appearing on exam today - Observation - Call clinic for new or changing moles - Recommend daily use of broad spectrum spf 30+ sunscreen to sun-exposed areas.   Hemangiomas - Red papules - Discussed benign nature - Observe - Call for any changes  Actinic Damage - Chronic condition, secondary to cumulative UV/sun exposure - diffuse scaly erythematous macules with underlying dyspigmentation -  Recommend daily broad spectrum sunscreen SPF 30+ to sun-exposed areas, reapply every 2 hours as needed.  - Staying in the shade or wearing long sleeves, sun glasses (UVA+UVB protection) and wide brim hats (4-inch brim around the entire  circumference of the hat) are also recommended for sun protection.  - Call for new or changing lesions.  History of Melanoma Left leg/ 1994 - No evidence of recurrence today - No lymphadenopathy - Recommend regular full body skin exams - Recommend daily broad spectrum sunscreen SPF 30+ to sun-exposed areas, reapply every 2 hours as needed.  - Call if any new or changing lesions are noted between office visits   Skin cancer screening performed today.   Return in about 1 year (around 10/11/2022) for TBSE, Hx of Melanoma .  IMarye Round, CMA, am acting as scribe for Sarina Ser, MD .

## 2021-10-10 NOTE — Patient Instructions (Signed)

## 2021-10-11 NOTE — Therapy (Signed)
[place] MAIN [facility] SERVICES [address] [place], [place], [address] Phone: [phone]   Fax:  [phone]  Physical Therapy Treatment  Patient Details  Name: Kathryn Hawkins MRN: [id number] Date of Birth: [birth year] Referring Provider (PT): Benjaman Kindler   Encounter Date: 10/10/2021   PT End of Session - 10/11/21 1226     Visit Number 11    Date for PT Re-Evaluation 11/26/21   PN 09/17/21   PT Start Time 1700    PT Stop Time 1800    PT Time Calculation (min) 60 min    Activity Tolerance No increased pain;Patient tolerated treatment well    Behavior During Therapy [facility] for tasks assessed/performed             Past Medical History:  Diagnosis Date   DDD (degenerative disc disease), lumbar 2017   Diverticulitis    Dyspareunia in female    Herniated disc, cervical    Lumbar spinal stenosis    Melanoma ([place]) 1994   Left leg. Level II, per patient   Osteopenia 2016   Vaginal dryness    Vulvar atrophy     Past Surgical History:  Procedure Laterality Date   ABDOMINAL HYSTERECTOMY  1994   with bladder tack   BLADDER SUSPENSION     BREAST BIOPSY Right 03/24/2019   [place] bx coil clip BENIGN BREAST TISSUE WITH PSEUDO-ANGIOMATOUS STROMAL HYPERPLASIA, USUAL DUCTAL HYPERPLASIA AND FOCAL FIBROADENOMATOID CHANGES   BREAST BIOPSY Right 03/21/2021   [place] bx mass, coil marker, path pending   BREAST BIOPSY Left 03/21/2021   stereo bx asymmetry, x marker, path pending   CHOLECYSTECTOMY N/A 08/05/2017   Procedure: LAPAROSCOPIC CHOLECYSTECTOMY;  Surgeon: Vickie Epley, MD;  Location: [facility] ORS;  Service: General;  Laterality: N/A;   COLONOSCOPY     2008, 2018   CYSTOURETHROSCOPY  01/2014   urethrolysis transvaginal   FLEXIBLE SIGMOIDOSCOPY  1994   KELOID EXCISION Right 1974   LAPAROSCOPIC OVARIAN CYSTECTOMY Right 1994   MELANOMA EXCISION Left    leg   TOTAL HIP ARTHROPLASTY Right 01/22/2021   Procedure: TOTAL HIP ARTHROPLASTY ANTERIOR  APPROACH;  Surgeon: Hessie Knows, MD;  Location: [facility] ORS;  Service: Orthopedics;  Laterality: Right;   TUBAL LIGATION      There were no vitals filed for this visit.   Subjective Assessment - 10/10/21 1703     Subjective Pt reported she tracked the time it takes to start voiding and measurement of urine output. pt noticed the past month, her frequency decreased and starting to urinate takes less time    Patient Stated Goals compeltely empty bladder and strengthn her core                [facility] PT Assessment - 10/11/21 1222       Observation/Other Assessments   Observations upright posture, no forward head/ thoracic kyphosis      Coordination   Coordination and Movement Description ab straining with breathing                        Pelvic Floor Special Questions - 10/11/21 1223     Pelvic Floor Internal Exam pt consented verbally and also had contraindications    Exam Type Vaginal    Palpation tightness and tenderness at  6-8 o'clock deep layer, obt int R, anterior attachment puborectalis ,  limited lengthening    Strength fair squeeze, definite lift   proper low abdominal lengthening with  pelvic floor after Tx              [facility] Adult PT Treatment/Exercise - 10/11/21 1223       Therapeutic Activites    Other Therapeutic Activities showed anatomy/ physiology images to explain pelvic floor mobility and IAP system coordination      Neuro Re-ed    Neuro Re-ed Details  Cued for anterior tilt and less abdominal straining      Moist Heat Therapy   Number Minutes Moist Heat 5 Minutes    Moist Heat Location --   perineum through sheets, butterfly pose, supported with props to promote relaxation     Manual Therapy   Internal Pelvic Floor STM/MWM to promote lengthening of pelvic floor mm noted in assessment                          PT Long Term Goals - 09/17/21 1411       PT LONG TERM GOAL #1   Title Pt will report improved Stool  consistency Type 4 across 50% of the time instead of 25% of the time with increased water intake from,16 fl oz to 32 fl oz. in order to minimize straining her pelvic floor with bowel movements    Time 8    Period Weeks    Status Achieved    Target Date 08/29/21      PT LONG TERM GOAL #2   Title Pt will demo proper deep core mm coordination with proper lengthening of pelvic floor in order to minimize waiting time to completely empty bladder from 5 min to < 1 min  ( 09/17/21: 3 min)    Time 4    Period Weeks    Status Partially Met    Target Date 08/01/21      PT LONG TERM GOAL #3   Title Pt will demo proper body mechanics to minimzie straining pelvic floor/ abdomen in order to improve Sx    Time 2    Period Weeks    Status On-going    Target Date 07/18/21      PT LONG TERM GOAL #4   Title Pt will demo proper coccyx and SIJ mobility and alignment and be able to sit on hard bench without a pillow while eating with family at restaurant. for 30 min    Time 6    Period Weeks    Status Achieved    Target Date 08/15/21      PT LONG TERM GOAL #5   Title Pt will demo no more higher L iliac crest  and demo no pain with R sideflexion across 2 visits in order to progress to deep core training    Time 4    Period Weeks    Status Achieved    Target Date 08/01/21      Additional Long Term Goals   Additional Long Term Goals Yes      PT LONG TERM GOAL #6   Title Pt will improve FOTO lumbar score from 60 pts to > 64 pts in order to stand and bend for ADLs ( 2/14/ 23 : 69 pts)    Time 10    Period Weeks    Status Achieved    Target Date 09/12/21      PT LONG TERM GOAL #7   Title Pt wil improved Urinary Problem score on FOTO from 57 pts to > 62 pts  ( 09/17/21:  61 pts)  and Bowel Constipation from 55 pts to > 60 pts ( 09/17/21: 67 pts)  in order to improve QOL    Time 10    Period Weeks    Status Achieved    Target Date 09/12/21      PT LONG TERM GOAL #8   Title Pt will demo proper  alignment with squat and report no knee pain in order to perform lifting objects off floor    Time 5    Status Achieved    Target Date 08/08/21      PT LONG TERM GOAL  #9   TITLE Pt will report being able to empty out completely when urinating without residual 75% of the time    Time 10    Period Weeks    Status New    Target Date 11/26/21      PT LONG TERM GOAL  #10   TITLE Pt demo increased thumb abduction from 40 deg to 60 deg ( L thumb 60 deg) in order to put weight on wrists and hand to get up from floor and also to hve no pain with wiping after BMs    Time 10    Period Weeks    Status New    Target Date 11/26/21                   Plan - 10/11/21 1226     Clinical Impression Statement Pt returned after a month away from PT. Pt reports her urinary frequency is improving.  Pt showed good maintenance of upright posture and no longer shows forward head and thoracic kyphosis.   Pt continued to require cues and manual Tx to promote more pelvic floor lengthening to help with urination initiation. Pt required cues for less abdominal straining and anterior tilt of pelvis to put less pressure downward onto pelvic floor.   Plan to continue with pelvic floor assessment and neuro-muscular re-education to improve urination. Pt continues to benefit from skilled PT.    Examination-Activity Limitations Continence    Stability/Clinical Decision Making Evolving/Moderate complexity    Rehab Potential Good    PT Frequency 1x / week    PT Duration Other (comment)   10   PT Treatment/Interventions Gait training;Stair training;Functional mobility training;Moist Heat;Therapeutic activities;Therapeutic exercise;Cryotherapy;Traction;Patient/family education;Neuromuscular re-education;Manual techniques;Balance training;Passive range of motion;Scar mobilization;Taping;Compression bandaging;Spinal Manipulations;Joint Manipulations    Consulted and Agree with Plan of Care Patient              Patient will benefit from skilled therapeutic intervention in order to improve the following deficits and impairments:  Decreased coordination, Decreased mobility, Decreased scar mobility, Increased muscle spasms, Decreased endurance, Decreased activity tolerance, Decreased balance, Difficulty walking, Decreased strength, Improper body mechanics, Postural dysfunction, Hypomobility, Increased fascial restricitons, Pain, Abnormal gait, Impaired flexibility, Impaired vision/preception  Visit Diagnosis: Sacrococcygeal disorders, not elsewhere classified  Retention of urine  Other lack of coordination  Other abnormalities of gait and mobility  Chronic pain of left knee  Chronic pain of right knee     Problem List Patient Active Problem List   Diagnosis Date Noted   Status post total hip replacement, right 01/22/2021   Right hip pain 01/27/2018   Lumbar spinal stenosis 01/27/2018   Gluteal tendinitis, right hip 01/27/2018   Calculus of gallbladder with acute cholecystitis without obstruction 08/04/2017   Osteopenia 05/18/2015   METATARSALGIA 08/14/2008   PLANTAR FASCIITIS, RIGHT 08/14/2008   FOOT PAIN, RIGHT 08/14/2008    Jerl Mina, PT 10/11/2021, 12:29 PM  [place] MAIN [facility] SERVICES [address] [place], [place], [address] Phone: [phone]   Fax:  [phone]  Name: Kathryn Hawkins MRN: [id number] Date of Birth: 12/28/1955

## 2021-10-16 ENCOUNTER — Encounter: Payer: Self-pay | Admitting: Dermatology

## 2021-10-17 DIAGNOSIS — E559 Vitamin D deficiency, unspecified: Secondary | ICD-10-CM | POA: Diagnosis not present

## 2021-10-17 DIAGNOSIS — H6121 Impacted cerumen, right ear: Secondary | ICD-10-CM | POA: Diagnosis not present

## 2021-10-17 DIAGNOSIS — F330 Major depressive disorder, recurrent, mild: Secondary | ICD-10-CM | POA: Diagnosis not present

## 2021-10-17 DIAGNOSIS — E538 Deficiency of other specified B group vitamins: Secondary | ICD-10-CM | POA: Diagnosis not present

## 2021-10-17 DIAGNOSIS — E782 Mixed hyperlipidemia: Secondary | ICD-10-CM | POA: Diagnosis not present

## 2021-10-17 DIAGNOSIS — Z0000 Encounter for general adult medical examination without abnormal findings: Secondary | ICD-10-CM | POA: Diagnosis not present

## 2021-10-24 ENCOUNTER — Ambulatory Visit: Payer: PPO | Admitting: Physical Therapy

## 2021-10-24 ENCOUNTER — Other Ambulatory Visit: Payer: Self-pay

## 2021-10-24 DIAGNOSIS — G8929 Other chronic pain: Secondary | ICD-10-CM

## 2021-10-24 DIAGNOSIS — R339 Retention of urine, unspecified: Secondary | ICD-10-CM

## 2021-10-24 DIAGNOSIS — R278 Other lack of coordination: Secondary | ICD-10-CM

## 2021-10-24 DIAGNOSIS — R2689 Other abnormalities of gait and mobility: Secondary | ICD-10-CM

## 2021-10-24 DIAGNOSIS — M533 Sacrococcygeal disorders, not elsewhere classified: Secondary | ICD-10-CM

## 2021-10-24 NOTE — Therapy (Signed)
[facility] ?[place], [place], [address] ?Phone: [phone]   Fax:  [phone] ? ?Physical Therapy Treatment ? ?Patient Details  ?Name: Kathryn Hawkins ?MRN: [id number] ?Date of Birth: 1955/11/22 ?Referring Provider (PT): Benjaman Kindler ? ? ?Encounter Date: 10/24/2021 ? ? PT End of Session - 10/24/21 1618   ? ? Visit Number 12   ? Date for PT Re-Evaluation 11/26/21   PN 09/17/21  ? PT Start Time 1615   ? PT Stop Time 6203   ? PT Time Calculation (min) 50 min   ? Activity Tolerance No increased pain;Patient tolerated treatment well   ? Behavior During Therapy [facility] for tasks assessed/performed   ? ?  ?  ? ?  ? ? ?Past Medical History:  ?Diagnosis Date  ? DDD (degenerative disc disease), lumbar 2017  ? Diverticulitis   ? Dyspareunia in female   ? Herniated disc, cervical   ? Lumbar spinal stenosis   ? Melanoma ([place]) 1994  ? Left leg. Level II, per patient  ? Osteopenia 2016  ? Vaginal dryness   ? Vulvar atrophy   ? ? ?Past Surgical History:  ?Procedure Laterality Date  ? ABDOMINAL HYSTERECTOMY  1994  ? with bladder tack  ? BLADDER SUSPENSION    ? BREAST BIOPSY Right 03/24/2019  ? [place] bx coil clip BENIGN BREAST TISSUE WITH PSEUDO-ANGIOMATOUS STROMAL HYPERPLASIA, USUAL DUCTAL HYPERPLASIA AND FOCAL FIBROADENOMATOID CHANGES  ? BREAST BIOPSY Right 03/21/2021  ? [place] bx mass, coil marker, path pending  ? BREAST BIOPSY Left 03/21/2021  ? stereo bx asymmetry, x marker, path pending  ? CHOLECYSTECTOMY N/A 08/05/2017  ? Procedure: LAPAROSCOPIC CHOLECYSTECTOMY;  Surgeon: Vickie Epley, MD;  Location: [facility];  Service: General;  Laterality: N/A;  ? COLONOSCOPY    ? 2008, 2018  ? CYSTOURETHROSCOPY  01/2014  ? urethrolysis transvaginal  ? [place]  ? KELOID EXCISION Right 1974  ? LAPAROSCOPIC OVARIAN CYSTECTOMY Right 1994  ? MELANOMA EXCISION Left   ? leg  ? TOTAL HIP ARTHROPLASTY Right 01/22/2021  ? Procedure: TOTAL HIP ARTHROPLASTY  ANTERIOR APPROACH;  Surgeon: Hessie Knows, MD;  Location: [facility];  Service: Orthopedics;  Laterality: Right;  ? TUBAL LIGATION    ? ? ?There were no vitals filed for this visit. ? ? Subjective Assessment - 10/24/21 1619   ? ? Subjective Pt reported she gets the signal to go but there is nothing coming out.   ? Patient Stated Goals compeltely empty bladder and strengthn her core   ? ?  ?  ? ?  ? ? ? ? ? ? ? ? ? ? ? ? ? ? ? ? ? Pelvic Floor Special Questions - 10/24/21 1839   ? ? Pelvic Floor Internal Exam pt consented verbally and also had contraindications   ? Exam Type Vaginal   ? Palpation tightness / tightness at pubo rectalis anterior B behind pubic symphysis   ? Strength fair squeeze, definite lift   proper low abdominal co activation with pelvic floor after Tx to elicit upward movement  ? ?  ?  ? ?  ? ? ? ? [place] Adult PT Treatment/Exercise - 10/24/21 1840   ? ?  ? Therapeutic Activites   ? Other Therapeutic Activities motivational interviewing to understand pt's behavior patterns that interrupt bradley loops, provided explanation to micturiction process, educated on completing bladder diary, explained next steps with referral to urologist for uodynamic study to understand  voiding patterns/ PSV   ?  ? Neuro Re-ed   ? Neuro Re-ed Details  cued for upward movement of pelvic floor , anterior tilts   ?  ? Manual Therapy  ? Internal Pelvic Floor STM/MWM to promote lengthening of pelvic floor mm noted in assessment and promote more upward movement of pelvic floor   ? ?  ?  ? ?  ? ? ? ? ? ? ? ? ? ? ? ? ? ? ? PT Long Term Goals - 10/24/21 1641   ? ?  ? PT LONG TERM GOAL #1  ? Title Pt will report improved Stool consistency Type 4 across 50% of the time instead of 25% of the time with increased water intake from,16 fl oz to 32 fl oz. in order to minimize straining her pelvic floor with bowel movements   ? Time 8   ? Period Weeks   ? Status Achieved   ? Target Date 08/29/21   ?  ? PT LONG TERM GOAL #2  ? Title Pt will  demo proper deep core mm coordination with proper lengthening of pelvic floor in order to minimize waiting time to completely empty bladder from 5 min to < 1 min  ( 09/17/21: 3 min)   ? Time 4   ? Period Weeks   ? Status Partially Met   ? Target Date 08/01/21   ?  ? PT LONG TERM GOAL #3  ? Title Pt will demo proper body mechanics to minimzie straining pelvic floor/ abdomen in order to improve Sx   ? Time 2   ? Period Weeks   ? Status On-going   ? Target Date 07/18/21   ?  ? PT LONG TERM GOAL #4  ? Title Pt will demo proper coccyx and SIJ mobility and alignment and be able to sit on hard bench without a pillow while eating with family at [place]. for 30 min   ? Time 6   ? Period Weeks   ? Status Achieved   ? Target Date 08/15/21   ?  ? PT LONG TERM GOAL #5  ? Title Pt will demo no more higher L iliac crest  and demo no pain with R sideflexion across 2 visits in order to progress to deep core training   ? Time 4   ? Period Weeks   ? Status Achieved   ? Target Date 08/01/21   ?  ? PT LONG TERM GOAL #6  ? Title Pt will improve FOTO lumbar score from 60 pts to > 64 pts in order to stand and bend for ADLs ( 2/14/ 23 : 69 pts)   ? Time 10   ? Period Weeks   ? Status Achieved   ? Target Date 09/12/21   ?  ? PT LONG TERM GOAL #7  ? Title Pt wil improved Urinary Problem score on FOTO from 57 pts to > 62 pts  ( 09/17/21:  61 pts)   and Bowel Constipation from 55 pts to > 60 pts ( 09/17/21: 67 pts)  in order to improve QOL   ? Time 10   ? Period Weeks   ? Status Achieved   ? Target Date 09/12/21   ?  ? PT LONG TERM GOAL #8  ? Title Pt will demo proper alignment with squat and report no knee pain in order to perform lifting objects off floor   ? Time 5   ? Status Achieved   ? Target  Date 08/08/21   ?  ? PT LONG TERM GOAL  #9  ? TITLE Pt will report being able to empty out completely when urinating without residual 75% of the time   ? Time 10   ? Period Weeks   ? Status On-going   ? Target Date 11/26/21   ?  ? PT LONG TERM GOAL   #10  ? TITLE Pt demo increased thumb abduction from 40 deg to 60 deg ( L thumb 60 deg) in order to put weight on wrists and hand to get up from floor and also to hve no pain with wiping after BMs   ? Time 10   ? Period Weeks   ? Status Partially Met   ? Target Date 11/26/21   ? ?  ?  ? ?  ? ? ? ? ? ? ? ? Plan - 10/24/21 1618   ? ? Clinical Impression Statement Provided motivational interviewing to understand pt's behavior patterns that interrupt bradley loops, provided explanation to micturiction process, educated on completing bladder diary, explained next steps with referral to urologist for uodynamic study to understand voiding patterns/ PSV.  ? ?Internal pelvic floor assessment showed increased tensions/ tensions anterior portion of puborectalis B and pt required cues for anterior tilt and upward coordination of pelvic floor and less overuse of abs.  Anticipate pt will improve with complete urination as pt improves coordination and shows decreased pelvic floor tightness in these remaining areas.  ? ?Pt continues to benefit from skilled PT  ? Examination-Activity Limitations Continence   ? Stability/Clinical Decision Making Evolving/Moderate complexity   ? Rehab Potential Good   ? PT Frequency 1x / week   ? PT Duration Other (comment)   10  ? PT Treatment/Interventions Gait training;Stair training;Functional mobility training;Moist Heat;Therapeutic activities;Therapeutic exercise;Cryotherapy;Traction;Patient/family education;Neuromuscular re-education;Manual techniques;Balance training;Passive range of motion;Scar mobilization;Taping;Compression bandaging;Spinal Manipulations;Joint Manipulations   ? Consulted and Agree with Plan of Care Patient   ? ?  ?  ? ?  ? ? ?Patient will benefit from skilled therapeutic intervention in order to improve the following deficits and impairments:  Decreased coordination, Decreased mobility, Decreased scar mobility, Increased muscle spasms, Decreased endurance, Decreased activity  tolerance, Decreased balance, Difficulty walking, Decreased strength, Improper body mechanics, Postural dysfunction, Hypomobility, Increased fascial restricitons, Pain, Abnormal gait, Impaired flexibility,

## 2021-10-24 NOTE — Patient Instructions (Signed)
Pillow under hips for deep core  ? ?__ ?  Behavioral changes to not override urge signal and not to rush with urination ? ?__ ? ?Complete bladder diary ? ?__ ? ?Continue to watch how to exhale with pelvic floor movement upward ? ?

## 2021-10-29 ENCOUNTER — Other Ambulatory Visit: Payer: Self-pay

## 2021-10-29 ENCOUNTER — Ambulatory Visit: Payer: PPO | Admitting: Physical Therapy

## 2021-10-29 DIAGNOSIS — R2689 Other abnormalities of gait and mobility: Secondary | ICD-10-CM

## 2021-10-29 DIAGNOSIS — M533 Sacrococcygeal disorders, not elsewhere classified: Secondary | ICD-10-CM

## 2021-10-29 DIAGNOSIS — R339 Retention of urine, unspecified: Secondary | ICD-10-CM

## 2021-10-29 DIAGNOSIS — R278 Other lack of coordination: Secondary | ICD-10-CM

## 2021-10-29 DIAGNOSIS — G8929 Other chronic pain: Secondary | ICD-10-CM

## 2021-10-29 NOTE — Therapy (Signed)
[facility] ?[place], [place], [address] ?Phone: [phone]   Fax:  [phone] ? ?Physical Therapy Treatment ? ?Patient Details  ?Name: Kathryn Hawkins ?MRN: [id number] ?Date of Birth: October 05, 1955 ?Referring Provider (PT): Benjaman Kindler ? ? ?Encounter Date: 10/29/2021 ? ? PT End of Session - 10/29/21 1422   ? ? Visit Number 13   ? Date for PT Re-Evaluation 11/26/21   PN 09/17/21  ? PT Start Time 1304   ? PT Stop Time 1405   ? PT Time Calculation (min) 61 min   ? Activity Tolerance No increased pain;Patient tolerated treatment well   ? Behavior During Therapy [facility] for tasks assessed/performed   ? ?  ?  ? ?  ? ? ?Past Medical History:  ?Diagnosis Date  ? DDD (degenerative disc disease), lumbar 2017  ? Diverticulitis   ? Dyspareunia in female   ? Herniated disc, cervical   ? Lumbar spinal stenosis   ? 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 Osteopenia 2016  ? Vaginal dryness   ? Vulvar atrophy   ? ? ?Past Surgical History:  ?Procedure Laterality Date  ? ABDOMINAL HYSTERECTOMY  1994  ? with bladder tack  ? BLADDER SUSPENSION    ? BREAST BIOPSY Right 03/24/2019  ? [place] bx coil clip BENIGN BREAST TISSUE WITH PSEUDO-ANGIOMATOUS STROMAL HYPERPLASIA, USUAL DUCTAL HYPERPLASIA AND FOCAL FIBROADENOMATOID CHANGES  ? BREAST BIOPSY Right 03/21/2021  ? [place] bx mass, coil marker, path pending  ? BREAST BIOPSY Left 03/21/2021  ? stereo bx asymmetry, x marker, path pending  ? CHOLECYSTECTOMY N/A 08/05/2017  ? Procedure: LAPAROSCOPIC CHOLECYSTECTOMY;  Surgeon: Vickie Epley, MD;  Location: [facility];  Service: General;  Laterality: N/A;  ? COLONOSCOPY    ? 2008, 2018  ? CYSTOURETHROSCOPY  01/2014  ? urethrolysis transvaginal  ? [place]  ? KELOID EXCISION Right 1974  ? LAPAROSCOPIC OVARIAN CYSTECTOMY Right 1994  ? MELANOMA EXCISION Left   ? leg  ? TOTAL HIP ARTHROPLASTY Right 01/22/2021  ? Procedure: TOTAL HIP ARTHROPLASTY  ANTERIOR APPROACH;  Surgeon: Hessie Knows, MD;  Location: [facility];  Service: Orthopedics;  Laterality: Right;  ? TUBAL LIGATION    ? ? ?There were no vitals filed for this visit. ? ? Subjective Assessment - 10/29/21 1357   ? ? Subjective Pt reported she completed her bladder diary. She has pain with sitting on piano or sewing so she has not been able to do these things   ? Patient Stated Goals compeltely empty bladder and strengthn her core   ? ?  ?  ? ?  ? ? ? ? ? [facility] PT Assessment - 10/29/21 1403   ? ?  ? Palpation  ? SI assessment  hypomobility at L SIJ , at level of S2 , painful   ? ?  ?  ? ?  ? ? ? ? ? ? ? ? ? ? ? ? ? Pelvic Floor Special Questions - 10/29/21 1404   ? ? Pelvic Floor Internal Exam pt consented verbally and also had contraindications   ? Exam Type Vaginal   ? Palpation tightness at pubo rectalis anterior B behind pubic symphysis R > L , iliococcyegus/ ischiococcyegus L  ? Strength fair squeeze, definite lift   ? ?  ?  ? ?  ? ? ? ? [place] Adult PT Treatment/Exercise - 10/29/21 1418   ? ?  ?  Neuro Re-ed   ? Neuro Re-ed Details  cued for floor <> stand with other alternative and avoid half kneeling   ?  ? Modalities  ? Modalities Moist Heat   ?  ? Moist Heat Therapy  ? Number Minutes Moist Heat 15 Minutes   ? Moist Heat Location --   10' under sacrum while receiving internal vaginal treatment, 5 '  at perneum through sheets in butterfly pose with guided relaxation  ?  ? Manual Therapy  ? Manual therapy comments sidelying: rotational mob/ PA mob at L SIJ to promote nutation of sacrum/ hip ext / ER ADB ilia   ? Internal Pelvic Floor STM/MWM to promote lengthening of pelvic floor mm noted in assessment and promote more upward movement of pelvic floor   ? ?  ?  ? ?  ? ? ? ? ? ? ? ? ? ? ? ? ? ? ? PT Long Term Goals - 10/29/21 1357   ? ?  ? PT LONG TERM GOAL #1  ? Title Pt will report improved Stool consistency Type 4 across 50% of the time instead of 25% of the time with increased water intake from,16  fl oz to 32 fl oz. in order to minimize straining her pelvic floor with bowel movements   ? Time 8   ? Period Weeks   ? Status Achieved   ? Target Date 08/29/21   ?  ? PT LONG TERM GOAL #2  ? Title Pt will demo proper deep core mm coordination with proper lengthening of pelvic floor in order to minimize waiting time to completely empty bladder from 5 min to < 1 min  ( 09/17/21: 3 min)   ? Time 4   ? Period Weeks   ? Status Partially Met   ? Target Date 08/01/21   ?  ? PT LONG TERM GOAL #3  ? Title Pt will demo proper body mechanics to minimzie straining pelvic floor/ abdomen in order to improve Sx   ? Time 2   ? Period Weeks   ? Status On-going   ? Target Date 07/18/21   ?  ? PT LONG TERM GOAL #4  ? Title Pt will demo proper coccyx and SIJ mobility and alignment and be able to sit on hard bench without a pillow while eating with family at [place]. for 30 min   ? Time 6   ? Period Weeks   ? Status On-going   ? Target Date 08/15/21   ?  ? PT LONG TERM GOAL #5  ? Title Pt will demo no more higher L iliac crest  and demo no pain with R sideflexion across 2 visits in order to progress to deep core training   ? Time 4   ? Period Weeks   ? Status Achieved   ? Target Date 08/01/21   ?  ? PT LONG TERM GOAL #6  ? Title Pt will improve FOTO lumbar score from 60 pts to > 64 pts in order to stand and bend for ADLs ( 2/14/ 23 : 69 pts)   ? Time 10   ? Period Weeks   ? Status Achieved   ? Target Date 09/12/21   ?  ? PT LONG TERM GOAL #7  ? Title Pt wil improved Urinary Problem score on FOTO from 57 pts to > 62 pts  ( 09/17/21:  61 pts)   and Bowel Constipation from 55 pts to > 60 pts ( 09/17/21: 67  pts)  in order to improve QOL   ? Time 10   ? Period Weeks   ? Status Achieved   ? Target Date 09/12/21   ?  ? PT LONG TERM GOAL #8  ? Title Pt will demo proper alignment with squat and report no knee pain in order to perform lifting objects off floor   ? Time 5   ? Status Achieved   ? Target Date 08/08/21   ?  ? PT LONG TERM GOAL  #9   ? TITLE Pt will report being able to empty out completely when urinating without residual 75% of the time   ? Time 10   ? Period Weeks   ? Status On-going   ? Target Date 11/26/21   ?  ? PT LONG TERM GOAL  #10  ? TITLE Pt demo increased thumb abduction from 40 deg to 60 deg ( L thumb 60 deg) in order to put weight on wrists and hand to get up from floor and also to hve no pain with wiping after BMs   ? Time 10   ? Period Weeks   ? Status Partially Met   ? Target Date 11/26/21   ? ?  ?  ? ?  ? ? ? ? ? ? ? ? Plan - 10/29/21 1422   ? ? Clinical Impression Statement Pt showed less tenderness at anterior pelvic floor mm but still required more internal manual Tx to optimize lengthening of pelvic floor to help with urination.  ? ?Pt returned with bladder diary and she reports she notices an improvement with frequency and emptying. Anticipate with today's manual Tx to minimzie tightness at anterior pelvic floor mm, pt will be able to eliminate more completely. Pt also is working on changing her behavorial habits to not delay urination for the sake of finishing a task.  ? ?Addressed L SIJ pain today and tightness at iliococcyegus/ ischiococcyegus L. Anticipate this area of change with manual Tx will help with her pain with sitting. Plan to add glut strengthening at upcoming session.  ? ?Pt continues to benefit from skilled PT  ? Examination-Activity Limitations Continence   ? Stability/Clinical Decision Making Evolving/Moderate complexity   ? Rehab Potential Good   ? PT Frequency 1x / week   ? PT Duration Other (comment)   10  ? PT Treatment/Interventions Gait training;Stair training;Functional mobility training;Moist Heat;Therapeutic activities;Therapeutic exercise;Cryotherapy;Traction;Patient/family education;Neuromuscular re-education;Manual techniques;Balance training;Passive range of motion;Scar mobilization;Taping;Compression bandaging;Spinal Manipulations;Joint Manipulations   ? Consulted and Agree with Plan of Care  Patient   ? ?  ?  ? ?  ? ? ?Patient will benefit from skilled therapeutic intervention in order to improve the following deficits and impairments:  Decreased coordination, Decreased mobility, Decreased scar m

## 2021-10-29 NOTE — Patient Instructions (Signed)
? ? ? ?  Transition from standing to floor : ? stand to floor transfer :  ?    _ slow ?    _ mini squat  ?    _ crawl down with one hand on thigh  ?    _downward dog  - >  shoulders down and back-  walk the dog ( knee bents to lengthe hamstrings) ?    ? ?Floor to stand :  ? ?downward dog   ?Feet are wider than hips, crawl hands back, butt is back, knees behind toes -> squat  ?Hands at waist , elbows back, chest lifts  ? ?

## 2021-11-06 ENCOUNTER — Ambulatory Visit: Payer: PPO | Attending: Obstetrics and Gynecology | Admitting: Physical Therapy

## 2021-11-06 DIAGNOSIS — M533 Sacrococcygeal disorders, not elsewhere classified: Secondary | ICD-10-CM | POA: Diagnosis not present

## 2021-11-06 DIAGNOSIS — G8929 Other chronic pain: Secondary | ICD-10-CM | POA: Insufficient documentation

## 2021-11-06 DIAGNOSIS — M25561 Pain in right knee: Secondary | ICD-10-CM | POA: Insufficient documentation

## 2021-11-06 DIAGNOSIS — M25562 Pain in left knee: Secondary | ICD-10-CM | POA: Diagnosis not present

## 2021-11-06 DIAGNOSIS — R339 Retention of urine, unspecified: Secondary | ICD-10-CM | POA: Insufficient documentation

## 2021-11-06 DIAGNOSIS — R278 Other lack of coordination: Secondary | ICD-10-CM | POA: Diagnosis not present

## 2021-11-06 DIAGNOSIS — R2689 Other abnormalities of gait and mobility: Secondary | ICD-10-CM | POA: Insufficient documentation

## 2021-11-06 NOTE — Therapy (Signed)
[facility] ?[place], [place], [address] ?Phone: [phone]   Fax:  [phone] ? ?Physical Therapy Treatment ? ?Patient Details  ?Name: Kathryn Hawkins ?MRN: [id number] ?Date of Birth: 10-20-1955 ?Referring Provider (PT): Benjaman Kindler ? ? ?Encounter Date: 11/06/2021 ? ? PT End of Session - 11/06/21 1102   ? ? Visit Number 14   ? Date for PT Re-Evaluation 11/26/21   PN 09/17/21  ? PT Start Time 1002   ? PT Stop Time 1100   ? PT Time Calculation (min) 58 min   ? Activity Tolerance No increased pain;Patient tolerated treatment well   ? Behavior During Therapy [facility] for tasks assessed/performed   ? ?  ?  ? ?  ? ? ?Past Medical History:  ?Diagnosis Date  ? DDD (degenerative disc disease), lumbar 2017  ? Diverticulitis   ? Dyspareunia in female   ? Herniated disc, cervical   ? Lumbar spinal stenosis   ? Melanoma ([place]) 1994  ? Left leg. Level II, per patient  ? Osteopenia 2016  ? Vaginal dryness   ? Vulvar atrophy   ? ? ?Past Surgical History:  ?Procedure Laterality Date  ? ABDOMINAL HYSTERECTOMY  1994  ? with bladder tack  ? BLADDER SUSPENSION    ? BREAST BIOPSY Right 03/24/2019  ? [place] bx coil clip BENIGN BREAST TISSUE WITH PSEUDO-ANGIOMATOUS STROMAL HYPERPLASIA, USUAL DUCTAL HYPERPLASIA AND FOCAL FIBROADENOMATOID CHANGES  ? BREAST BIOPSY Right 03/21/2021  ? [place] bx mass, coil marker, path pending  ? BREAST BIOPSY Left 03/21/2021  ? stereo bx asymmetry, x marker, path pending  ? CHOLECYSTECTOMY N/A 08/05/2017  ? Procedure: LAPAROSCOPIC CHOLECYSTECTOMY;  Surgeon: Vickie Epley, MD;  Location: [facility];  Service: General;  Laterality: N/A;  ? COLONOSCOPY    ? 2008, 2018  ? CYSTOURETHROSCOPY  01/2014  ? urethrolysis transvaginal  ? [place]  ? KELOID EXCISION Right 1974  ? LAPAROSCOPIC OVARIAN CYSTECTOMY Right 1994  ? MELANOMA EXCISION Left   ? leg  ? TOTAL HIP ARTHROPLASTY Right 01/22/2021  ? Procedure: TOTAL HIP ARTHROPLASTY ANTERIOR  APPROACH;  Surgeon: Hessie Knows, MD;  Location: [facility];  Service: Orthopedics;  Laterality: Right;  ? TUBAL LIGATION    ? ? ?There were no vitals filed for this visit. ? ? Subjective Assessment - 11/06/21 1008   ? ? Subjective Pt noticed after last session, she could feel pee coming out and without delay. But it returned to baseline. The area on L SIJ was relieved for 2-3 days but it came back , particularly with sitting on the floor when painting baseboards or on the ground weeding. Pt sits with legs straight out and does not sit crossed legged for long due to the L SIJ pain. Pt notices pain in medial knee L after sitting crossed legged. Pt notices general ache in the knee throughout the day. Pt had her THA on R and had the L knee pain before the R THA   ? Patient Stated Goals compeltely empty bladder and strengthn her core   ? ?  ?  ? ?  ? ? ? ? ? [facility] PT Assessment - 11/06/21 1018   ? ?  ? Ambulation/Gait  ? Gait Comments L knee with hyperextension/ IR  in stance   ? ?  ?  ? ?  ? ? ? ? ? ? ? ? ? ? ? ? ? Pelvic Floor Special Questions - 11/06/21 1057   ? ?  Pelvic Floor Internal Exam pt consented verbally and also had contraindications   ? Exam Type Vaginal   ? Palpation no more tightness at pubo rectalis anterior B behind pubic symphysis R > L, tightness and tenderness at 5 , 7 o'clock at depeest layers near ischial spine/ coccyx   ? Strength fair squeeze, definite lift   ? ?  ?  ? ?  ? ? ? ? [facility] Adult PT Treatment/Exercise - 11/06/21 1018   ? ?  ? Modalities  ? Modalities Moist Heat   ?  ? Moist Heat Therapy  ? Number Minutes Moist Heat 5 Minutes   ? Moist Heat Location --   supported wide knee childs pose to promote pelvic floor lenghtening / SIJ alignment, moist pack through clothing at perineum, guided pelvic floor coordination with b reath  ?  ? Manual Therapy  ? Internal Pelvic Floor STM/MWM at 5 , 7 o'clock at depeest layers near ischial spine/ coccyx   ? ?  ?  ? ?  ? ? ? ? ? ? ? ? ? ? ? ? ? ? ? PT  Long Term Goals - 10/29/21 1357   ? ?  ? PT LONG TERM GOAL #1  ? Title Pt will report improved Stool consistency Type 4 across 50% of the time instead of 25% of the time with increased water intake from,16 fl oz to 32 fl oz. in order to minimize straining her pelvic floor with bowel movements   ? Time 8   ? Period Weeks   ? Status Achieved   ? Target Date 08/29/21   ?  ? PT LONG TERM GOAL #2  ? Title Pt will demo proper deep core mm coordination with proper lengthening of pelvic floor in order to minimize waiting time to completely empty bladder from 5 min to < 1 min  ( 09/17/21: 3 min)   ? Time 4   ? Period Weeks   ? Status Partially Met   ? Target Date 08/01/21   ?  ? PT LONG TERM GOAL #3  ? Title Pt will demo proper body mechanics to minimzie straining pelvic floor/ abdomen in order to improve Sx   ? Time 2   ? Period Weeks   ? Status On-going   ? Target Date 07/18/21   ?  ? PT LONG TERM GOAL #4  ? Title Pt will demo proper coccyx and SIJ mobility and alignment and be able to sit on hard bench without a pillow while eating with family at [place]. for 30 min   ? Time 6   ? Period Weeks   ? Status On-going   ? Target Date 08/15/21   ?  ? PT LONG TERM GOAL #5  ? Title Pt will demo no more higher L iliac crest  and demo no pain with R sideflexion across 2 visits in order to progress to deep core training   ? Time 4   ? Period Weeks   ? Status Achieved   ? Target Date 08/01/21   ?  ? PT LONG TERM GOAL #6  ? Title Pt will improve FOTO lumbar score from 60 pts to > 64 pts in order to stand and bend for ADLs ( 2/14/ 23 : 69 pts)   ? Time 10   ? Period Weeks   ? Status Achieved   ? Target Date 09/12/21   ?  ? PT LONG TERM GOAL #7  ? Title Pt wil improved  Urinary Problem score on FOTO from 57 pts to > 62 pts  ( 09/17/21:  61 pts)   and Bowel Constipation from 55 pts to > 60 pts ( 09/17/21: 67 pts)  in order to improve QOL   ? Time 10   ? Period Weeks   ? Status Achieved   ? Target Date 09/12/21   ?  ? PT LONG TERM GOAL #8   ? Title Pt will demo proper alignment with squat and report no knee pain in order to perform lifting objects off floor   ? Time 5   ? Status Achieved   ? Target Date 08/08/21   ?  ? PT LONG TERM GOAL  #9  ? TITLE Pt will report being able to empty out completely when urinating without residual 75% of the time   ? Time 10   ? Period Weeks   ? Status On-going   ? Target Date 11/26/21   ?  ? PT LONG TERM GOAL  #10  ? TITLE Pt demo increased thumb abduction from 40 deg to 60 deg ( L thumb 60 deg) in order to put weight on wrists and hand to get up from floor and also to hve no pain with wiping after BMs   ? Time 10   ? Period Weeks   ? Status Partially Met   ? Target Date 11/26/21   ? ?  ?  ? ?  ? ? ? ? ? ? ? ? Plan - 11/06/21 1103   ? ? Clinical Impression Statement Pt showed less anterior pelvic floor mm tightness compared to last week. Pt required more internal Tx to address tightness by ischial spine/coccyx bilaterally. Pt required cues for proper lengthening of pelvic floor and anterior tilt of pelvis. Pt showed less lowered urethra position today. Pt continues to require skilled PT to help with minimizing pelvic floor restrictions/ optimizing pelvic floor functions. Advised pt to hold off participating in pilates classes due to pt's need for more relaxation of pelvic floor and ab system.    ? Examination-Activity Limitations Continence   ? Stability/Clinical Decision Making Evolving/Moderate complexity   ? Rehab Potential Good   ? PT Frequency 1x / week   ? PT Duration Other (comment)   10  ? PT Treatment/Interventions Gait training;Stair training;Functional mobility training;Moist Heat;Therapeutic activities;Therapeutic exercise;Cryotherapy;Traction;Patient/family education;Neuromuscular re-education;Manual techniques;Balance training;Passive range of motion;Scar mobilization;Taping;Compression bandaging;Spinal Manipulations;Joint Manipulations   ? Consulted and Agree with Plan of Care Patient   ? ?  ?  ? ?   ? ? ?Patient will benefit from skilled therapeutic intervention in order to improve the following deficits and impairments:  Decreased coordination, Decreased mobility, Decreased scar mobility, Increased m

## 2021-11-06 NOTE — Patient Instructions (Signed)
Mermaid pose  ? ? ?When sitting on the floor, sit on yoga block/ blanket under hips, knees lower than hips, blocks under knees for support  ? ? ?Support wide knee childs pose with poillow under belly and under thighs  ?

## 2021-11-08 ENCOUNTER — Other Ambulatory Visit: Payer: Self-pay | Admitting: Dermatology

## 2021-11-12 ENCOUNTER — Telehealth: Payer: Self-pay

## 2021-11-12 ENCOUNTER — Ambulatory Visit: Payer: PPO | Admitting: Physical Therapy

## 2021-11-12 DIAGNOSIS — G8929 Other chronic pain: Secondary | ICD-10-CM

## 2021-11-12 DIAGNOSIS — M533 Sacrococcygeal disorders, not elsewhere classified: Secondary | ICD-10-CM

## 2021-11-12 DIAGNOSIS — R278 Other lack of coordination: Secondary | ICD-10-CM

## 2021-11-12 DIAGNOSIS — R339 Retention of urine, unspecified: Secondary | ICD-10-CM

## 2021-11-12 DIAGNOSIS — R2689 Other abnormalities of gait and mobility: Secondary | ICD-10-CM

## 2021-11-12 NOTE — Therapy (Signed)
[facility] ?[place], [place], [address] ?Phone: [phone]   Fax:  [phone] ? ?Physical Therapy Treatment ? ?Patient Details  ?Name: Kathryn Hawkins ?MRN: [id number] ?Date of Birth: Jun 10, 1956 ?Referring Provider (PT): Benjaman Kindler ? ? ?Encounter Date: 11/12/2021 ? ? PT End of Session - 11/12/21 1619   ? ? Visit Number 15   ? Date for PT Re-Evaluation 11/26/21   PN 09/17/21  ? PT Start Time 1400   ? PT Stop Time 1500   ? PT Time Calculation (min) 60 min   ? Activity Tolerance No increased pain;Patient tolerated treatment well   ? Behavior During Therapy [facility] for tasks assessed/performed   ? ?  ?  ? ?  ? ? ?Past Medical History:  ?Diagnosis Date  ? DDD (degenerative disc disease), lumbar 2017  ? Diverticulitis   ? Dyspareunia in female   ? Herniated disc, cervical   ? Lumbar spinal stenosis   ? 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 Osteopenia 2016  ? Vaginal dryness   ? Vulvar atrophy   ? ? ?Past Surgical History:  ?Procedure Laterality Date  ? ABDOMINAL HYSTERECTOMY  1994  ? with bladder tack  ? BLADDER SUSPENSION    ? BREAST BIOPSY Right 03/24/2019  ? [place] bx coil clip BENIGN BREAST TISSUE WITH PSEUDO-ANGIOMATOUS STROMAL HYPERPLASIA, USUAL DUCTAL HYPERPLASIA AND FOCAL FIBROADENOMATOID CHANGES  ? BREAST BIOPSY Right 03/21/2021  ? [place] bx mass, coil marker, path pending  ? BREAST BIOPSY Left 03/21/2021  ? stereo bx asymmetry, x marker, path pending  ? CHOLECYSTECTOMY N/A 08/05/2017  ? Procedure: LAPAROSCOPIC CHOLECYSTECTOMY;  Surgeon: Vickie Epley, MD;  Location: [facility];  Service: General;  Laterality: N/A;  ? COLONOSCOPY    ? 2008, 2018  ? CYSTOURETHROSCOPY  01/2014  ? urethrolysis transvaginal  ? [place]  ? KELOID EXCISION Right 1974  ? LAPAROSCOPIC OVARIAN CYSTECTOMY Right 1994  ? MELANOMA EXCISION Left   ? leg  ? TOTAL HIP ARTHROPLASTY Right 01/22/2021  ? Procedure: TOTAL HIP ARTHROPLASTY  ANTERIOR APPROACH;  Surgeon: Hessie Knows, MD;  Location: [facility];  Service: Orthopedics;  Laterality: Right;  ? TUBAL LIGATION    ? ? ?There were no vitals filed for this visit. ? ? Subjective Assessment - 11/12/21 1411   ? ? Subjective After last session, pt reported 90% improved with her L LBP and able to pee more without delay and not has often. Pt 's LBP returned 4 days later. 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 Pt noticed tingling down to side /back of L knee.   ? Patient Stated Goals compeltely empty bladder and strengthn her core   ? ?  ?  ? ?  ? ? ? ? ? [facility] PT Assessment - 11/12/21 1413   ? ?  ? Coordination  ? Coordination and Movement Description inferior pole of R scapula delayed with shoulder abduction   ?  ? Posture/Postural Control  ? Posture Comments posteior COM in standing and weightbearing in heels in backward lunging   ?  ? Palpation  ? Palpation comment tightness  intercostals anterior / posterior T 4-5, 5-6 , thoracic segments T5-10 hypomobile, tightnes at interspinals at L   ?  ? Ambulation/Gait  ? Gait  Comments R pelvic/thoracic  rotation less posterior rotation   ? ?  ?  ? ?  ? ? ? ? ? ? ? ? ? ? ? ? ? ? ? ? [facility] Adult PT Treatment/Exercise - 11/12/21 1413   ? ?  ? Neuro Re-ed   ? Neuro Re-ed Details  cued for propioception in backward stepping/ lunges, and standing posture   ?  ? Modalities  ? Modalities Moist Heat   ?  ? Moist Heat Therapy  ? Number Minutes Moist Heat 5 Minutes   unbilled  ? Moist Heat Location --   lower trunk rotation for more R posterior rotation, supported , heat at thoracic  ?  ? Manual Therapy  ? Internal Pelvic Floor STM./MWM at intercostals anterior / posterior T 4-5, 5-6 , thoracic segments T5-10 and interspinals at L  to promote more R posterior rotation of thorax, optimize anterior excursion of intercostals  for optimal deep core system   ? ?  ?  ? ?  ? ? ? ? ? ? ? ? ? ? ? ? ? ? ? PT Long Term Goals - 11/12/21 1620   ? ?  ? PT LONG TERM GOAL #1  ? Title Pt will report improved Stool consistency Type 4 across 50% of the time instead of 25% of the time with increased water intake from,16 fl oz to 32 fl oz. in order to minimize straining her pelvic floor with bowel movements   ? Time 8   ? Period Weeks   ? Status Achieved   ? Target Date 08/29/21   ?  ? PT LONG TERM GOAL #2  ? Title Pt will demo proper deep core mm coordination with proper lengthening of pelvic floor in order to minimize waiting time to completely empty bladder from 5 min to < 1 min  ( 09/17/21: 3 min)   ? Time 4   ? Period Weeks   ? Status Partially Met   ? Target Date 08/01/21   ?  ? PT LONG TERM GOAL #3  ? Title Pt will demo proper body mechanics to minimzie straining pelvic floor/ abdomen in order to improve Sx   ? Time 2   ? Period Weeks   ? Status On-going   ? Target Date 07/18/21   ?  ? PT LONG TERM GOAL #4  ? Title Pt will demo proper coccyx and SIJ mobility and alignment and be able to sit on hard bench without a pillow while eating with family at [place]. for 30 min   ? Time 6   ? Period Weeks   ? Status Partially Met   ? Target Date 08/15/21   ?  ? PT LONG TERM GOAL #5  ? Title Pt will demo no more higher L iliac crest  and demo no pain with R sideflexion across 2 visits in order to progress to deep core training   ? Time 4   ? Period Weeks   ? Status Achieved   ? Target Date 08/01/21   ?  ? Additional Long Term Goals  ? Additional Long Term Goals Yes   ?  ? PT LONG TERM GOAL #6  ? Title Pt will improve FOTO lumbar score from 60 pts to > 64 pts in order to stand and bend for ADLs ( 2/14/ 23 : 69 pts)   ? Time 10   ? Period Weeks   ? Status Achieved   ? Target Date 09/12/21   ?  ?  PT LONG TERM GOAL #7  ? Title Pt wil improved Urinary Problem score on FOTO from 57 pts to > 62 pts  ( 09/17/21:  61 pts)   and Bowel Constipation from 55 pts to > 60  pts ( 09/17/21: 67 pts)  in order to improve QOL   ? Time 10   ? Period Weeks   ? Status Achieved   ? Target Date 09/12/21   ?  ? PT LONG TERM GOAL #8  ? Title Pt will demo proper alignment with squat and report no knee pain in order to perform lifting objects off floor   ? Time 5   ? Status Achieved   ? Target Date 08/08/21   ?  ? PT LONG TERM GOAL  #9  ? TITLE Pt will report being able to empty out completely when urinating without residual 75% of the time   ? Time 10   ? Period Weeks   ? Status On-going   ? Target Date 11/26/21   ?  ? PT LONG TERM GOAL  #10  ? TITLE Pt demo increased thumb abduction from 40 deg to 60 deg ( L thumb 60 deg) in order to put weight on wrists and hand to get up from floor and also to hve no pain with wiping after BMs   ? Time 10   ? Period Weeks   ? Status Partially Met   ? Target Date 11/26/21   ?  ? PT LONG TERM GOAL  #11  ? TITLE Pt will report less L pain at SIJ and demo more reciprocal pattern with thorax and pelvis in gait and more R thorax posterior rotation in order to improve QOLand ADLs   ? Time 8   ? Period Weeks   ? Status New   ? Target Date 01/07/22   ? ?  ?  ? ?  ? ? ? ? ? ? ? ? Plan - 11/12/21 1628   ? ? Clinical Impression Statement Pt responded positively to internal pelvic floor Tx last session with improve emptying of bladder and decreased L SIJ pain. However, L SIJ pain returned four days later.  Pt noticed tingling down to side /back of L knee.  ? 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 Explained the need to continue improving her posture to minimize worsening of stenosis and bulging discs.Explained Tavlov cyst can contribute to L SIJ pain.  ? ?Focused today on optimizing R posterior rotation of thorax which pt demo'd post Tx and displayed more reciporcal rotation of thorax and pelvis in gait.  Anticipate this improvement will help  with optimal deep core and IAP system function to help with urinary elimination and minimize L SIJ pain.  ? ?Plan to refer pt to uro-gynecologist for urodynamic study.  ? ?Pt continues to benefit from skilled

## 2021-11-12 NOTE — Patient Instructions (Addendum)
Stable lunge backwards, and front knee above ankle and opp arm in half V  ?4 points of contact  ? ? ?2 min  ? ?__ ? Open book ( handout)  ?

## 2021-11-12 NOTE — Telephone Encounter (Signed)
Called pt discussed, per  Dr Raliegh Ip if we start Terbinafine tablets, we will have to review her labs and she will need to schedule an return  appt for 1 month, pt report she will think about this and she will call here if she want to schedule an appt ? ?

## 2021-11-12 NOTE — Telephone Encounter (Signed)
Pt called she was in the office last month, she was prescribed Kerydin  for Tinea pedis, which her insurance will not cover so she never got that rx, pt report in the past she took Terbinafine tablets which helped, she think she need a internal medication like Terbinafine again. She does not want to come back into the office and pay a copayment this soon.  ?

## 2021-11-19 ENCOUNTER — Encounter: Payer: PPO | Admitting: Physical Therapy

## 2021-11-28 ENCOUNTER — Ambulatory Visit: Payer: PPO | Admitting: Physical Therapy

## 2021-11-28 DIAGNOSIS — R339 Retention of urine, unspecified: Secondary | ICD-10-CM

## 2021-11-28 DIAGNOSIS — G8929 Other chronic pain: Secondary | ICD-10-CM

## 2021-11-28 DIAGNOSIS — M533 Sacrococcygeal disorders, not elsewhere classified: Secondary | ICD-10-CM | POA: Diagnosis not present

## 2021-11-28 DIAGNOSIS — R278 Other lack of coordination: Secondary | ICD-10-CM

## 2021-11-28 DIAGNOSIS — R2689 Other abnormalities of gait and mobility: Secondary | ICD-10-CM

## 2021-11-29 NOTE — Therapy (Signed)
[place] [facility] ?[place], [place], [address] ?Phone: [phone]   Fax:  [phone] ? ?Physical Therapy Treatment ? ?Patient Details  ?Name: Kathryn Hawkins ?MRN: [id number] ?Date of Birth: Dec 02, 1955 ?Referring Provider (PT): Benjaman Kindler ? ? ?Encounter Date: 11/28/2021 ? ? PT End of Session - 11/28/21 1550   ? ? Visit Number 16   ? Date for PT Re-Evaluation 02/06/22   PN 09/17/21  ? PT Start Time 1304   ? PT Stop Time 2229   ? PT Time Calculation (min) 59 min   ? Activity Tolerance No increased pain;Patient tolerated treatment well   ? Behavior During Therapy [facility] for tasks assessed/performed   ? ?  ?  ? ?  ? ? ?Past Medical History:  ?Diagnosis Date  ? DDD (degenerative disc disease), lumbar 2017  ? Diverticulitis   ? Dyspareunia in female   ? Herniated disc, cervical   ? Lumbar spinal stenosis   ? Melanoma ([place]) 1994  ? Left leg. Level II, per patient  ? Osteopenia 2016  ? Vaginal dryness   ? Vulvar atrophy   ? ? ?Past Surgical History:  ?Procedure Laterality Date  ? ABDOMINAL HYSTERECTOMY  1994  ? with bladder tack  ? BLADDER SUSPENSION    ? BREAST BIOPSY Right 03/24/2019  ? [place] bx coil clip BENIGN BREAST TISSUE WITH PSEUDO-ANGIOMATOUS STROMAL HYPERPLASIA, USUAL DUCTAL HYPERPLASIA AND FOCAL FIBROADENOMATOID CHANGES  ? BREAST BIOPSY Right 03/21/2021  ? [place] bx mass, coil marker, path pending  ? BREAST BIOPSY Left 03/21/2021  ? stereo bx asymmetry, x marker, path pending  ? CHOLECYSTECTOMY N/A 08/05/2017  ? Procedure: LAPAROSCOPIC CHOLECYSTECTOMY;  Surgeon: Vickie Epley, MD;  Location: [facility];  Service: General;  Laterality: N/A;  ? COLONOSCOPY    ? 2008, 2018  ? CYSTOURETHROSCOPY  01/2014  ? urethrolysis transvaginal  ? [place]  ? KELOID EXCISION Right 1974  ? LAPAROSCOPIC OVARIAN CYSTECTOMY Right 1994  ? MELANOMA EXCISION Left   ? leg  ? TOTAL HIP ARTHROPLASTY Right 01/22/2021  ? Procedure: TOTAL HIP ARTHROPLASTY  ANTERIOR APPROACH;  Surgeon: Hessie Knows, MD;  Location: [facility] ORS;  Service: Orthopedics;  Laterality: Right;  ? TUBAL LIGATION    ? ? ?There were no vitals filed for this visit. ? ? Subjective Assessment - 11/28/21 1311   ? ? Subjective Pt noticed her L LBP is manageable. Pt is not feeling her LBP today and is not happening as often even sitting. Pt is still noticing she is not emptying urine easily   ? Patient Stated Goals compeltely empty bladder and strengthn her core   ? ?  ?  ? ?  ? ? ? ? ? [facility] PT Assessment - 11/28/21 1530   ? ?  ? Coordination  ? Coordination and Movement Description posterior tilt of pelvis   ? ?  ?  ? ?  ? ? ? ? ? ? ? ? ? ? ? ? ? Pelvic Floor Special Questions - 11/28/21 1528   ? ? Pelvic Floor Internal Exam pt consented verbally and also had contraindications   ? Exam Type Vaginal   ? Palpation 1-2 o'clock  tightness at puborectalis anterior B, tightness 5 o'clock to ischial rami L   ? Strength fair squeeze, definite lift   ? ?  ?  ? ?  ? ? ? ? [place] Adult PT Treatment/Exercise - 11/28/21 1604   ? ?  ?  Modalities  ? Modalities Moist Heat   ?  ? Moist Heat Therapy  ? Number Minutes Moist Heat 5 Minutes   ? Moist Heat Location --   perineum in prone, pillow u nder belly  ?  ? Manual Therapy  ? Internal Pelvic Floor STM/MWM   ? ?  ?  ? ?  ? ? ? ? ? ? ? ? ? ? ? ? ? ? ? PT Long Term Goals - 11/12/21 1620   ? ?  ? PT LONG TERM GOAL #1  ? Title Pt will report improved Stool consistency Type 4 across 50% of the time instead of 25% of the time with increased water intake from,16 fl oz to 32 fl oz. in order to minimize straining her pelvic floor with bowel movements   ? Time 8   ? Period Weeks   ? Status Achieved   ? Target Date 08/29/21   ?  ? PT LONG TERM GOAL #2  ? Title Pt will demo proper deep core mm coordination with proper lengthening of pelvic floor in order to minimize waiting time to completely empty bladder from 5 min to < 1 min  ( 09/17/21: 3 min)   ? Time 4   ? Period Weeks   ?  Status Partially Met   ? Target Date 08/01/21   ?  ? PT LONG TERM GOAL #3  ? Title Pt will demo proper body mechanics to minimzie straining pelvic floor/ abdomen in order to improve Sx   ? Time 2   ? Period Weeks   ? Status On-going   ? Target Date 07/18/21   ?  ? PT LONG TERM GOAL #4  ? Title Pt will demo proper coccyx and SIJ mobility and alignment and be able to sit on hard bench without a pillow while eating with family at restaurant. for 30 min   ? Time 6   ? Period Weeks   ? Status Partially Met   ? Target Date 08/15/21   ?  ? PT LONG TERM GOAL #5  ? Title Pt will demo no more higher L iliac crest  and demo no pain with R sideflexion across 2 visits in order to progress to deep core training   ? Time 4   ? Period Weeks   ? Status Achieved   ? Target Date 08/01/21   ?  ? Additional Long Term Goals  ? Additional Long Term Goals Yes   ?  ? PT LONG TERM GOAL #6  ? Title Pt will improve FOTO lumbar score from 60 pts to > 64 pts in order to stand and bend for ADLs ( 2/14/ 23 : 69 pts)   ? Time 10   ? Period Weeks   ? Status Achieved   ? Target Date 09/12/21   ?  ? PT LONG TERM GOAL #7  ? Title Pt wil improved Urinary Problem score on FOTO from 57 pts to > 62 pts  ( 09/17/21:  61 pts)   and Bowel Constipation from 55 pts to > 60 pts ( 09/17/21: 67 pts)  in order to improve QOL   ? Time 10   ? Period Weeks   ? Status Achieved   ? Target Date 09/12/21   ?  ? PT LONG TERM GOAL #8  ? Title Pt will demo proper alignment with squat and report no knee pain in order to perform lifting objects off floor   ? Time 5   ?   Status Achieved   ? Target Date 08/08/21   ?  ? PT LONG TERM GOAL  #9  ? TITLE Pt will report being able to empty out completely when urinating without residual 75% of the time   ? Time 10   ? Period Weeks   ? Status On-going   ? Target Date 11/26/21   ?  ? PT LONG TERM GOAL  #10  ? TITLE Pt demo increased thumb abduction from 40 deg to 60 deg ( L thumb 60 deg) in order to put weight on wrists and hand to get up  from floor and also to hve no pain with wiping after BMs   ? Time 10   ? Period Weeks   ? Status Partially Met   ? Target Date 11/26/21   ?  ? PT LONG TERM GOAL  #11  ? TITLE Pt will report less L pain at SIJ and demo more reciprocal pattern with thorax and pelvis in gait and more R thorax posterior rotation in order to improve QOLand ADLs   ? Time 8   ? Period Weeks   ? Status New   ? Target Date 01/07/22   ? ?  ?  ? ?  ? ? ? ? ? ? ? ? Plan - 11/28/21 1603   ? ? Clinical Impression Statement Pt 's LBP is improving. Pt is able to find anterior tilt of pelvis in sitting with less LBP. Internal pelvic floor Tx revealed a need to further decrease areas of tensions at 2 and 5 o'clock locations. Pt demo'd improve position of pelvic organs and required less cues coordination and showed less downward forces onto pelvic floor. Anticipate pt wil improve with elimination of urine with these improvements. Continue to treat pelvic floor tensions at upcoming sessions. Pt continues to benefit from skilled PT  ? Personal Factors and Comorbidities Other;Comorbidity 1   ? 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 Pt noticed tingling down to side /back of L knee.   ? Examination-Activity Limitations Continence   ? Stability/Clinical Decision Making Evolving/Moderate complexity   ? Rehab Potential Good   ? PT Frequency 1x / week   ? PT Duration Other (comment)   10  ? PT Treatment/Interventions Gait training;Stair training;Functional mobility training;Moist Heat;Therapeutic activities;Therapeutic exercise;Cryotherapy;Traction;Patient/family education;Neuromuscular re-education;Manual techniques;Balance training;Passive range of motion;Scar mobilization;Taping;Compression bandaging;Spinal Manipulations;Joint Manipulations   ? Consulted and Agree with Plan of Care Patient   ? ?  ?  ? ?   ? ? ?Patient will benefit from skilled therapeutic intervention in order to improve the following deficits and impairments:  Decreased coordination, Decreased mobility, Decreased scar mobility, Increased muscle spasms,

## 2021-12-09 ENCOUNTER — Ambulatory Visit: Payer: PPO | Attending: Obstetrics and Gynecology | Admitting: Physical Therapy

## 2021-12-09 DIAGNOSIS — M25562 Pain in left knee: Secondary | ICD-10-CM | POA: Diagnosis not present

## 2021-12-09 DIAGNOSIS — R2689 Other abnormalities of gait and mobility: Secondary | ICD-10-CM | POA: Insufficient documentation

## 2021-12-09 DIAGNOSIS — R339 Retention of urine, unspecified: Secondary | ICD-10-CM | POA: Insufficient documentation

## 2021-12-09 DIAGNOSIS — R278 Other lack of coordination: Secondary | ICD-10-CM | POA: Diagnosis not present

## 2021-12-09 DIAGNOSIS — G8929 Other chronic pain: Secondary | ICD-10-CM | POA: Insufficient documentation

## 2021-12-09 DIAGNOSIS — M25561 Pain in right knee: Secondary | ICD-10-CM | POA: Diagnosis not present

## 2021-12-09 DIAGNOSIS — M533 Sacrococcygeal disorders, not elsewhere classified: Secondary | ICD-10-CM | POA: Insufficient documentation

## 2021-12-09 NOTE — Therapy (Signed)
[place] ?[facility] ?[place], [place], [address] ?Phone: [phone]   Fax:  [phone] ? ?Physical Therapy Treatment ? ?Patient Details  ?Name: Kathryn Hawkins ?MRN: [id number] ?Date of Birth: 1956/03/05 ?Referring Provider (PT): Benjaman Kindler ? ? ?Encounter Date: 12/09/2021 ? ? PT End of Session - 12/09/21 1803   ? ? Visit Number 17   ? Date for PT Re-Evaluation 02/06/22   PN 09/17/21  ? PT Start Time 2683   ? PT Stop Time 4196   ? PT Time Calculation (min) 60 min   ? Activity Tolerance No increased pain;Patient tolerated treatment well   ? Behavior During Therapy [facility] for tasks assessed/performed   ? ?  ?  ? ?  ? ? ?Past Medical History:  ?Diagnosis Date  ? DDD (degenerative disc disease), lumbar 2017  ? Diverticulitis   ? Dyspareunia in female   ? Herniated disc, cervical   ? Lumbar spinal stenosis   ? Melanoma ([place]) 1994  ? Left leg. Level II, per patient  ? Osteopenia 2016  ? Vaginal dryness   ? Vulvar atrophy   ? ? ?Past Surgical History:  ?Procedure Laterality Date  ? ABDOMINAL HYSTERECTOMY  1994  ? with bladder tack  ? BLADDER SUSPENSION    ? BREAST BIOPSY Right 03/24/2019  ? [place] bx coil clip BENIGN BREAST TISSUE WITH PSEUDO-ANGIOMATOUS STROMAL HYPERPLASIA, USUAL DUCTAL HYPERPLASIA AND FOCAL FIBROADENOMATOID CHANGES  ? BREAST BIOPSY Right 03/21/2021  ? [place] bx mass, coil marker, path pending  ? BREAST BIOPSY Left 03/21/2021  ? stereo bx asymmetry, x marker, path pending  ? CHOLECYSTECTOMY N/A 08/05/2017  ? Procedure: LAPAROSCOPIC CHOLECYSTECTOMY;  Surgeon: Vickie Epley, MD;  Location: [facility];  Service: General;  Laterality: N/A;  ? COLONOSCOPY    ? 2008, 2018  ? CYSTOURETHROSCOPY  01/2014  ? urethrolysis transvaginal  ? [place]  ? KELOID EXCISION Right 1974  ? LAPAROSCOPIC OVARIAN CYSTECTOMY Right 1994  ? MELANOMA EXCISION Left   ? leg  ? TOTAL HIP ARTHROPLASTY Right 01/22/2021  ? Procedure: TOTAL HIP ARTHROPLASTY ANTERIOR  APPROACH;  Surgeon: Hessie Knows, MD;  Location: [facility];  Service: Orthopedics;  Laterality: Right;  ? TUBAL LIGATION    ? ? ?There were no vitals filed for this visit. ? ? Subjective Assessment - 12/09/21 1711   ? ? Subjective Pt is less focused on her frustration. Pt did not get up one night to pee across the past 2 weeks.Pt can get comfortable with LBP. Pt is not focuse don LBP and urgency as much.   ? Patient Stated Goals compeltely empty bladder and strengthn her core   ? ?  ?  ? ?  ? ? ? ? ? ? ? ? ? ? ? ? ? ? ? ? ? Pelvic Floor Special Questions - 12/09/21 1742   ? ? Pelvic Floor Internal Exam pt consented verbally and also had contraindications   ? Exam Type Vaginal   ? Palpation tightness 7 oclock deep layer ATLA, 5 o'clock coccygeus   ? Strength fair squeeze, definite lift   ? ?  ?  ? ?  ? ? ? ? [place] Adult PT Treatment/Exercise - 12/09/21 1744   ? ?  ? Neuro Re-ed   ? Neuro Re-ed Details  cued for upward sequential contraction of pelvic floor   ?  ? Modalities  ? Modalities Moist Heat   ?  ? Moist Heat Therapy  ?  Number Minutes Moist Heat 5 Minutes   ? Moist Heat Location --   perineum through sheets  ?  ? Manual Therapy  ? Internal Pelvic Floor STM/MWM at problem areas noted in pelvic floor   ? ?  ?  ? ?  ? ? ? ? ? ? ? ? ? ? ? ? ? ? ? PT Long Term Goals - 11/12/21 1620   ? ?  ? PT LONG TERM GOAL #1  ? Title Pt will report improved Stool consistency Type 4 across 50% of the time instead of 25% of the time with increased water intake from,16 fl oz to 32 fl oz. in order to minimize straining her pelvic floor with bowel movements   ? Time 8   ? Period Weeks   ? Status Achieved   ? Target Date 08/29/21   ?  ? PT LONG TERM GOAL #2  ? Title Pt will demo proper deep core mm coordination with proper lengthening of pelvic floor in order to minimize waiting time to completely empty bladder from 5 min to < 1 min  ( 09/17/21: 3 min)   ? Time 4   ? Period Weeks   ? Status Partially Met   ? Target Date 08/01/21   ?  ?  PT LONG TERM GOAL #3  ? Title Pt will demo proper body mechanics to minimzie straining pelvic floor/ abdomen in order to improve Sx   ? Time 2   ? Period Weeks   ? Status On-going   ? Target Date 07/18/21   ?  ? PT LONG TERM GOAL #4  ? Title Pt will demo proper coccyx and SIJ mobility and alignment and be able to sit on hard bench without a pillow while eating with family at [place]. for 30 min   ? Time 6   ? Period Weeks   ? Status Partially Met   ? Target Date 08/15/21   ?  ? PT LONG TERM GOAL #5  ? Title Pt will demo no more higher L iliac crest  and demo no pain with R sideflexion across 2 visits in order to progress to deep core training   ? Time 4   ? Period Weeks   ? Status Achieved   ? Target Date 08/01/21   ?  ? Additional Long Term Goals  ? Additional Long Term Goals Yes   ?  ? PT LONG TERM GOAL #6  ? Title Pt will improve FOTO lumbar score from 60 pts to > 64 pts in order to stand and bend for ADLs ( 2/14/ 23 : 69 pts)   ? Time 10   ? Period Weeks   ? Status Achieved   ? Target Date 09/12/21   ?  ? PT LONG TERM GOAL #7  ? Title Pt wil improved Urinary Problem score on FOTO from 57 pts to > 62 pts  ( 09/17/21:  61 pts)   and Bowel Constipation from 55 pts to > 60 pts ( 09/17/21: 67 pts)  in order to improve QOL   ? Time 10   ? Period Weeks   ? Status Achieved   ? Target Date 09/12/21   ?  ? PT LONG TERM GOAL #8  ? Title Pt will demo proper alignment with squat and report no knee pain in order to perform lifting objects off floor   ? Time 5   ? Status Achieved   ? Target Date 08/08/21   ?  ?  PT LONG TERM GOAL  #9  ? TITLE Pt will report being able to empty out completely when urinating without residual 75% of the time   ? Time 10   ? Period Weeks   ? Status On-going   ? Target Date 11/26/21   ?  ? PT LONG TERM GOAL  #10  ? TITLE Pt demo increased thumb abduction from 40 deg to 60 deg ( L thumb 60 deg) in order to put weight on wrists and hand to get up from floor and also to hve no pain with wiping after  BMs   ? Time 10   ? Period Weeks   ? Status Partially Met   ? Target Date 11/26/21   ?  ? PT LONG TERM GOAL  #11  ? TITLE Pt will report less L pain at SIJ and demo more reciprocal pattern with thorax and pelvis in gait and more R thorax posterior rotation in order to improve QOLand ADLs   ? Time 8   ? Period Weeks   ? Status New   ? Target Date 01/07/22   ? ?  ?  ? ?  ? ? ? ? ? ? ? ? Plan - 12/09/21 1804   ? ? Clinical Impression Statement Pt required manual Tx  to continue decreasing deeper pelvic floor mm. Pt demo'd improved lengthening and requried less cues for anterior tilt of pelvis which is good cary over. Pt is compliant with downregulating nn system and pelvic floor stretches which together helps with her sustained improvements. Pt notices less attention to urgency and frequency and LBP. Pt is able to get more comfortable to sleep and with sitting.  Plan to continue every other week frequency of session to help pt be IND before d/c. Pt continues to benefit from skilled PT.   ? Personal Factors and Comorbidities Other;Comorbidity 1   ? 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 Pt noticed tingling down to side /back of L knee.   ? Examination-Activity Limitations Continence   ? Stability/Clinical Decision Making Evolving/Moderate complexity   ? Rehab Potential Good   ? PT Frequency 1x / week   ? PT Duration Other (comment)   10  ? PT Treatment/Interventions Gait training;Stair training;Functional mobility training;Moist Heat;Therapeutic activities;Therapeutic exercise;Cryotherapy;Traction;Patient/family education;Neuromuscular re-education;Manual techniques;Balance training;Passive range of motion;Scar mobilization;Taping;Compression bandaging;Spinal Manipulations;Joint Manipulations   ? Consulted and Agree with Plan of Care Patient   ? ?  ?  ? ?  ? ? ?Patient will  benefit from skilled therapeutic intervention in order to improve the following deficits and impairments:  Decreased coordination, Decreased mobility, Decreased scar mobility, Increased muscle spasms

## 2021-12-23 ENCOUNTER — Ambulatory Visit: Payer: PPO | Admitting: Physical Therapy

## 2022-01-02 DIAGNOSIS — D3131 Benign neoplasm of right choroid: Secondary | ICD-10-CM | POA: Diagnosis not present

## 2022-01-02 DIAGNOSIS — H2513 Age-related nuclear cataract, bilateral: Secondary | ICD-10-CM | POA: Diagnosis not present

## 2022-01-06 ENCOUNTER — Encounter: Payer: PPO | Admitting: Physical Therapy

## 2022-01-07 ENCOUNTER — Other Ambulatory Visit: Payer: Self-pay | Admitting: Internal Medicine

## 2022-01-07 DIAGNOSIS — Z1231 Encounter for screening mammogram for malignant neoplasm of breast: Secondary | ICD-10-CM

## 2022-01-13 ENCOUNTER — Other Ambulatory Visit: Payer: Self-pay | Admitting: Internal Medicine

## 2022-01-13 DIAGNOSIS — N6459 Other signs and symptoms in breast: Secondary | ICD-10-CM | POA: Diagnosis not present

## 2022-01-13 DIAGNOSIS — N6320 Unspecified lump in the left breast, unspecified quadrant: Secondary | ICD-10-CM | POA: Diagnosis not present

## 2022-01-13 DIAGNOSIS — Z1231 Encounter for screening mammogram for malignant neoplasm of breast: Secondary | ICD-10-CM

## 2022-01-13 DIAGNOSIS — M25541 Pain in joints of right hand: Secondary | ICD-10-CM | POA: Diagnosis not present

## 2022-01-13 DIAGNOSIS — N63 Unspecified lump in unspecified breast: Secondary | ICD-10-CM

## 2022-01-20 ENCOUNTER — Ambulatory Visit: Payer: PPO | Attending: Obstetrics and Gynecology | Admitting: Physical Therapy

## 2022-01-20 DIAGNOSIS — M25562 Pain in left knee: Secondary | ICD-10-CM | POA: Insufficient documentation

## 2022-01-20 DIAGNOSIS — R339 Retention of urine, unspecified: Secondary | ICD-10-CM | POA: Insufficient documentation

## 2022-01-20 DIAGNOSIS — M533 Sacrococcygeal disorders, not elsewhere classified: Secondary | ICD-10-CM | POA: Diagnosis not present

## 2022-01-20 DIAGNOSIS — R2689 Other abnormalities of gait and mobility: Secondary | ICD-10-CM | POA: Diagnosis not present

## 2022-01-20 DIAGNOSIS — G8929 Other chronic pain: Secondary | ICD-10-CM | POA: Diagnosis not present

## 2022-01-20 DIAGNOSIS — R278 Other lack of coordination: Secondary | ICD-10-CM | POA: Diagnosis not present

## 2022-01-20 DIAGNOSIS — M25561 Pain in right knee: Secondary | ICD-10-CM | POA: Insufficient documentation

## 2022-01-20 NOTE — Therapy (Signed)
[place] [facility] [address] [place], [place], [address] Phone: [phone]   Fax:  [phone]  Physical Therapy Treatment / Discharge Summary  Name: Kathryn Hawkins MRN: [id number] Date of Birth: 1956-07-15 Referring Provider (PT): Benjaman Kindler   Encounter Date: 01/20/2022   PT End of Session - 01/20/22 1620     Visit Number 18    Date for PT Re-Evaluation 02/06/22   PN 09/17/21   PT Start Time 1600    PT Stop Time 1620    PT Time Calculation (min) 20 min    Activity Tolerance No increased pain;Patient tolerated treatment well    Behavior During Therapy [facility] for tasks assessed/performed             Past Medical History:  Diagnosis Date   DDD (degenerative disc disease), lumbar 2017   Diverticulitis    Dyspareunia in female    Herniated disc, cervical    Lumbar spinal stenosis    Melanoma ([place]) 1994   Left leg. Level II, per patient   Osteopenia 2016   Vaginal dryness    Vulvar atrophy     Past Surgical History:  Procedure Laterality Date   ABDOMINAL HYSTERECTOMY  1994   with bladder tack   BLADDER SUSPENSION     BREAST BIOPSY Right 03/24/2019   [place] bx coil clip BENIGN BREAST TISSUE WITH PSEUDO-ANGIOMATOUS STROMAL HYPERPLASIA, USUAL DUCTAL HYPERPLASIA AND FOCAL FIBROADENOMATOID CHANGES   BREAST BIOPSY Right 03/21/2021   [place] bx mass, coil marker, path pending   BREAST BIOPSY Left 03/21/2021   stereo bx asymmetry, x marker, path pending   CHOLECYSTECTOMY N/A 08/05/2017   Procedure: LAPAROSCOPIC CHOLECYSTECTOMY;  Surgeon: Vickie Epley, MD;  Location: [facility] ORS;  Service: General;  Laterality: N/A;   COLONOSCOPY     2008, 2018   CYSTOURETHROSCOPY  01/2014   urethrolysis transvaginal   FLEXIBLE SIGMOIDOSCOPY  1994   KELOID EXCISION Right 1974   LAPAROSCOPIC OVARIAN CYSTECTOMY Right 1994   MELANOMA EXCISION Left    leg   TOTAL HIP ARTHROPLASTY Right 01/22/2021   Procedure: TOTAL HIP ARTHROPLASTY  ANTERIOR APPROACH;  Surgeon: Hessie Knows, MD;  Location: [facility] ORS;  Service: Orthopedics;  Laterality: Right;   TUBAL LIGATION      There were no vitals filed for this visit.   Subjective Assessment - 01/20/22 1621     Subjective Pt is not feeling her prolapse as much  except when she get up out of bed wrong without rolling on her side    Patient Stated Goals compeltely empty bladder and strengthn her core                [facility] PT Assessment - 01/20/22 1703       Palpation   Palpation comment hypomobile captitate joint R wrist which limits her with wiping herself with bowel movement                           [facility] Adult PT Treatment/Exercise - 01/20/22 1703       Therapeutic Activites    Other Therapeutic Activities reassessed goals, discussed about discharged      Manual Therapy   Internal Pelvic Floor PA / AP mobs at captitate joints and adjacent joints to promote more wrist ext and finger abduction  PT Long Term Goals - 01/20/22 1621       PT LONG TERM GOAL #1   Title Pt will report improved Stool consistency Type 4 across 50% of the time instead of 25% of the time with increased water intake from,16 fl oz to 32 fl oz. in order to minimize straining her pelvic floor with bowel movements    Time 8    Period Weeks    Status Achieved    Target Date 08/29/21      PT LONG TERM GOAL #2   Title Pt will demo proper deep core mm coordination with proper lengthening of pelvic floor in order to minimize waiting time to completely empty bladder from 5 min to < 1 min  ( 09/17/21: 3 min)  ( 01/20/22:  1-2 min)    Time 4    Period Weeks    Status Achieved    Target Date 08/01/21      PT LONG TERM GOAL #3   Title Pt will demo proper body mechanics to minimzie straining pelvic floor/ abdomen in order to improve Sx    Time 2    Period Weeks    Status On-going    Target Date 07/18/21      PT LONG TERM GOAL #4   Title  Pt will demo proper coccyx and SIJ mobility and alignment and be able to sit on hard bench without a pillow while eating with family at restaurant. for 30 min    Time 6    Period Weeks    Status Achieved    Target Date 08/15/21      PT LONG TERM GOAL #5   Title Pt will demo no more higher L iliac crest  and demo no pain with R sideflexion across 2 visits in order to progress to deep core training    Time 4    Period Weeks    Status Achieved    Target Date 08/01/21      PT LONG TERM GOAL #6   Title Pt will improve FOTO lumbar score from 60 pts to > 64 pts in order to stand and bend for ADLs ( 2/14/ 23 : 69 pts)    Time 10    Period Weeks    Status Achieved    Target Date 09/12/21      PT LONG TERM GOAL #7   Title Pt wil improved Urinary Problem score on FOTO from 57 pts to > 62 pts  ( 09/17/21:  61 pts)   and Bowel Constipation from 55 pts to > 60 pts ( 09/17/21: 67 pts)  in order to improve QOL    Time 10    Period Weeks    Status Achieved    Target Date 09/12/21      PT LONG TERM GOAL #8   Title Pt will demo proper alignment with squat and report no knee pain in order to perform lifting objects off floor    Time 5    Status Achieved    Target Date 08/08/21      PT LONG TERM GOAL  #9   TITLE Pt will report being able to empty out completely when urinating without residual 75% of the time    Time 10    Period Weeks    Status Achieved    Target Date 11/26/21      PT LONG TERM GOAL  #10   TITLE Pt demo increased thumb abduction from 40 deg to 60  deg ( L thumb 60 deg) in order to put weight on wrists and hand to get up from floor and also to hve no pain with wiping after BMs  ( 01/20/22: 80 deg)    Time 10    Period Weeks    Status Achieved    Target Date 11/26/21      PT LONG TERM GOAL  #11   TITLE Pt will report less L pain at SIJ and demo more reciprocal pattern with thorax and pelvis in gait and more R thorax posterior rotation in order to improve QOLand ADLs    Time 8     Period Weeks    Status Achieved    Target Date 01/07/22                   Plan - 01/20/22 1705     Clinical Impression Statement Pt achieved 100% of her goals. FOTO scores for lumbar and Pelvic function have improvement significantly. Pt has improved pelvic floor function, less prolapse, more coordination of pelvic floor, corrected SIJ and spinal alignment, more mobility of SIJ, and corrected alignment of coccyx, and significantly decreased pelvic floor mm tightness. Pt has improved ability to eliminate urine and ability to position pelvic organs with proper pelvic floor movement and lengthening for bowel movements. Pt is able to sit comfortably at restaurants.  Continued to work on improving R wrist mobility today  to assist with self-hygiene when wiping for bowel movements.  Recommending pt to see OT for wrist limitations 2/2 arthritis. Pt is ready for discharge at this time.      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 Pt noticed tingling down to side /back of L knee.    Examination-Activity Limitations Continence    Stability/Clinical Decision Making Evolving/Moderate complexity    Rehab Potential Good    PT Frequency 1x / week    PT Duration Other (comment)   10   PT Treatment/Interventions Gait training;Stair training;Functional mobility training;Moist Heat;Therapeutic activities;Therapeutic exercise;Cryotherapy;Traction;Patient/family education;Neuromuscular re-education;Manual techniques;Balance training;Passive range of motion;Scar mobilization;Taping;Compression bandaging;Spinal Manipulations;Joint Manipulations    Consulted and Agree with Plan of Care Patient             Patient will benefit from skilled therapeutic intervention in order to improve the following  deficits and impairments:  Decreased coordination, Decreased mobility, Decreased scar mobility, Increased muscle spasms, Decreased endurance, Decreased activity tolerance, Decreased balance, Difficulty walking, Decreased strength, Improper body mechanics, Postural dysfunction, Hypomobility, Increased fascial restricitons, Pain, Abnormal gait, Impaired flexibility, Impaired vision/preception  Visit Diagnosis: Retention of urine  Other lack of coordination  Sacrococcygeal disorders, not elsewhere classified  Other abnormalities of gait and mobility  Chronic pain of left knee  Chronic pain of right knee     Problem List Patient Active Problem List   Diagnosis Date Noted   Status post total hip replacement, right 01/22/2021   Right hip pain 01/27/2018   Lumbar spinal stenosis 01/27/2018   Gluteal tendinitis, right hip 01/27/2018   Calculus of gallbladder with acute cholecystitis without obstruction 08/04/2017   Osteopenia 05/18/2015   METATARSALGIA 08/14/2008   PLANTAR FASCIITIS, RIGHT 08/14/2008   FOOT PAIN, RIGHT 08/14/2008    Jerl Mina, PT 01/20/2022, 5:05 PM  [place] [facility] [place],  [place], [address] Phone: [phone]   Fax:  [phone]  Name: TANZANIA BASHAM MRN: [id number] Date of Birth: March 05, 1956

## 2022-01-29 ENCOUNTER — Ambulatory Visit
Admission: RE | Admit: 2022-01-29 | Discharge: 2022-01-29 | Disposition: A | Discharge status: Home / Self Care | Payer: PPO | Source: Ambulatory Visit | Attending: Internal Medicine | Admitting: Internal Medicine

## 2022-01-29 DIAGNOSIS — Z9889 Other specified postprocedural states: Secondary | ICD-10-CM | POA: Diagnosis not present

## 2022-01-29 DIAGNOSIS — L298 Other pruritus: Secondary | ICD-10-CM | POA: Insufficient documentation

## 2022-01-29 DIAGNOSIS — N6342 Unspecified lump in left breast, subareolar: Secondary | ICD-10-CM | POA: Diagnosis not present

## 2022-01-29 DIAGNOSIS — Z1231 Encounter for screening mammogram for malignant neoplasm of breast: Secondary | ICD-10-CM | POA: Insufficient documentation

## 2022-01-29 DIAGNOSIS — N6459 Other signs and symptoms in breast: Secondary | ICD-10-CM | POA: Diagnosis not present

## 2022-01-29 DIAGNOSIS — N63 Unspecified lump in unspecified breast: Secondary | ICD-10-CM | POA: Diagnosis present

## 2022-02-11 ENCOUNTER — Encounter: Payer: PPO | Admitting: Physical Therapy

## 2022-02-17 DIAGNOSIS — R350 Frequency of micturition: Secondary | ICD-10-CM | POA: Diagnosis not present

## 2022-02-17 DIAGNOSIS — R351 Nocturia: Secondary | ICD-10-CM | POA: Diagnosis not present

## 2022-02-17 DIAGNOSIS — R339 Retention of urine, unspecified: Secondary | ICD-10-CM | POA: Diagnosis not present

## 2022-02-17 DIAGNOSIS — Z124 Encounter for screening for malignant neoplasm of cervix: Secondary | ICD-10-CM | POA: Diagnosis not present

## 2022-02-17 DIAGNOSIS — Z1331 Encounter for screening for depression: Secondary | ICD-10-CM | POA: Diagnosis not present

## 2022-02-18 ENCOUNTER — Ambulatory Visit: Payer: PPO | Attending: Internal Medicine | Admitting: Occupational Therapy

## 2022-02-18 ENCOUNTER — Encounter: Payer: Self-pay | Admitting: Occupational Therapy

## 2022-02-18 DIAGNOSIS — M79641 Pain in right hand: Secondary | ICD-10-CM | POA: Insufficient documentation

## 2022-02-18 DIAGNOSIS — M25641 Stiffness of right hand, not elsewhere classified: Secondary | ICD-10-CM | POA: Diagnosis not present

## 2022-02-18 NOTE — Therapy (Signed)
[facility] [address], [place], [address] Phone: [phone]   Fax:  [phone]  Occupational Therapy Evaluation  Patient Details  Name: Kathryn Hawkins MRN: [id number] Date of Birth: 11-05-1955 Referring Provider (OT): Dr Emily Filbert   Encounter Date: 02/18/2022   OT End of Session - 02/18/22 1310     Visit Number 1    Number of Visits 5    Date for OT Re-Evaluation 04/15/22    OT Start Time 1025    OT Stop Time 1123    OT Time Calculation (min) 58 min    Activity Tolerance Patient tolerated treatment well    Behavior During Therapy [facility] for tasks assessed/performed             Past Medical History:  Diagnosis Date   DDD (degenerative disc disease), lumbar 2017   Diverticulitis    Dyspareunia in female    Herniated disc, cervical    Lumbar spinal stenosis    Melanoma ([place]) 1994   Left leg. Level II, per patient   Osteopenia 2016   Vaginal dryness    Vulvar atrophy     Past Surgical History:  Procedure Laterality Date   ABDOMINAL HYSTERECTOMY  1994   with bladder tack   BLADDER SUSPENSION     BREAST BIOPSY Right 03/24/2019   [place] bx coil clip BENIGN BREAST TISSUE WITH PSEUDO-ANGIOMATOUS STROMAL HYPERPLASIA, USUAL DUCTAL HYPERPLASIA AND FOCAL FIBROADENOMATOID CHANGES   BREAST BIOPSY Right 03/21/2021   [place] bx mass, coil marker, dense fibrous stroma   BREAST BIOPSY Left 03/21/2021   stereo bx asymmetry, x marker, benign tissue   CHOLECYSTECTOMY N/A 08/05/2017   Procedure: LAPAROSCOPIC CHOLECYSTECTOMY;  Surgeon: Vickie Epley, MD;  Location: [facility] ORS;  Service: General;  Laterality: N/A;   COLONOSCOPY     2008, 2018   CYSTOURETHROSCOPY  01/2014   urethrolysis transvaginal   FLEXIBLE SIGMOIDOSCOPY  1994   KELOID EXCISION Right 1974   LAPAROSCOPIC OVARIAN CYSTECTOMY Right 1994   MELANOMA EXCISION Left    leg   TOTAL HIP ARTHROPLASTY Right 01/22/2021   Procedure: TOTAL HIP  ARTHROPLASTY ANTERIOR APPROACH;  Surgeon: Hessie Knows, MD;  Location: [facility] ORS;  Service: Orthopedics;  Laterality: Right;   TUBAL LIGATION      There were no vitals filed for this visit.   Subjective Assessment - 02/18/22 1303     Subjective  This thumb has been bothering me for a while -burning pain nighttime.  During the day when I try to pinch do a hair clip, yoga pose, clapping hands writing, opening packages    Pertinent History Patient was referred by Dr. Sabra Heck her PCP for right hand pain.  Patient reports increased pain in the right base of thumb 4/10 to 10/10 depending on what activity she does.  Never pain-free.  Patient do have an appointment with his hand surgeon in August or September.  But patient try to avoid surgery.  Patient referred to OT for right thumb pain.    Patient Stated Goals I want to trying avoid surgery if possible.  I want to you to help me with my motion my pain    Currently in Pain? Yes    Pain Score 5     Pain Location Hand    Pain Orientation Right    Pain Descriptors / Indicators Aching;Tender    Pain Type Chronic pain    Pain Onset More than a month ago  Pain Frequency Constant               [facility] OT Assessment - 02/18/22 0001       Assessment   Medical Diagnosis THumb CMC pain    Referring Provider (OT) Dr Emily Filbert    Onset Date/Surgical Date 08/04/21    Hand Dominance Right      Home  Environment   Lives With Spouse      Prior Function   Vocation Part time employment    Leisure 10 hours a day and computer.  Some gardening and yard work.  Do want to try and do some crafts like painting and sewing      AROM   Right Wrist Extension 80 Degrees    Right Wrist Flexion 95 Degrees   pain base of thumb   Left Wrist Extension 88 Degrees    Left Wrist Flexion 95 Degrees      Strength   Right Hand Grip (lbs) 51    Right Hand Lateral Pinch 7 lbs    Right Hand 3 Point Pinch 13 lbs    Left Hand Grip (lbs) 48    Left Hand Lateral  Pinch 9 lbs    Left Hand 3 Point Pinch 11 lbs      Right Hand AROM   R Thumb Radial ABduction/ADduction 0-55 50    R Thumb Palmar ABduction/ADduction 0-45 60    R Thumb Opposition to Index --   Opposition to base of fifth digit with abduction of CMC into palm                     OT Treatments/Exercises (OP) - 02/18/22 0001       RUE Paraffin   Number Minutes Paraffin 8 Minutes    RUE Paraffin Location Hand    Comments prior to ROM and soft tissue - decrease pain              Patient with decreased pain and increased active range of motion for thumb palmar radial abduction after paraffin.  Recommend for patient to use paraffin or moist heat in the morning in the evening.  Patient to do 10 reps of palmar radial abduction as well as opposition-avoiding CMC collapse into carpal tunnel. Handout was provided and reviewed with patient about joint protection as well as modifications to increase independency in ADLs and IADLs. Patient fitted with a [place] neoprene brace to use with activities that cause pain or lingering afterwards.     Patient to continue with home program for 2 to 3 weeks and can return I recommend for her to bring a list with activities that causing pain that she cannot modify. Patient to have appointment in August or September with a hand surgeon.  Recommended for patient to keep that appointment.     OT Education - 02/18/22 1310     Education Details Findings of eval and home program    Person(s) Educated Patient    Methods Explanation;Demonstration;Tactile cues;Verbal cues;Handout    Comprehension Returned demonstration;Verbalized understanding;Verbal cues required                 OT Long Term Goals - 02/18/22 1313       OT LONG TERM GOAL #1   Title Patient to be independent in home program to maintain or improve range of motion decrease pain to less than 4/10 at the most.    Baseline Pain in right thumb 5-10/10, pain constantly.  decrease  endrange thumb palmar /radial abduction and as well as tightness in [facility] during opposition, no knowledge of home program    Time 4    Period Weeks    Status New    Target Date 03/11/22      OT LONG TERM GOAL #2   Title Patient to show decreased pain in right thumb to have increased lateral pinch by 2 pounds to do clip for hair    Baseline Pain 5-10/10 at base of thumb CMC, lateral pinch decreased right 7 pounds left 9 pounds    Time 8    Period Weeks    Status New    Target Date 04/08/22      OT LONG TERM GOAL #3   Title Patient verbalized 3 joint protection or modifications that patient is doing at home and ADLs and IADLs to increase independence with less pain.    Baseline Patient has no soft neoprene splint, no modalities to decrease pain, no knowledge of modifications or adaptive equipment-pain 5-10/10 in the right thumb [facility]    Time 8    Period Weeks    Status New    Target Date 04/15/22                   Plan - 02/18/22 1312     Clinical Impression Statement Patient referred to OT for right dominant hand thumb CMC pain.  Patient present with tenderness over [facility] as well as positive grinding test.  Patient show abduction and tightness in webspace during opposition as well as prehension strength.  Patient should decrease lateral pinch.  During three-point pinch patient show hyperextension of IP with abduction and into the carpal tunnel at the [facility].  Patient was educated on use of paraffin to decrease pain and increase mobility.  To maintain range of motion and strength to be able to perform her ADLs and IADLs more independent with less pain.  Patient was also provided information and handout on joint protection and modification.  Patient fitted with a [place] neoprene to use during the day with functional task that cause pain at the [facility] during or afterwards.  Patient can benefit from skilled OT services to decrease pain in right dominant hand and increased or maintain motion and  strength to be independence in ADLs and IADLs.  Patient wants to try and avoid surgery.    OT Occupational Profile and History Problem Focused Assessment - Including review of records relating to presenting problem    Occupational performance deficits (Please refer to evaluation for details): ADL's;IADL's;Work;Play;Leisure;Social Participation    Body Structure / Function / Physical Skills ADL;Decreased knowledge of use of DME;Pain;UE functional use;IADL;ROM;Flexibility    Rehab Potential Good    Clinical Decision Making Limited treatment options, no task modification necessary    Comorbidities Affecting Occupational Performance: None    Modification or Assistance to Complete Evaluation  No modification of tasks or assist necessary to complete eval    OT Frequency Biweekly    OT Duration 8 weeks    OT Treatment/Interventions Self-care/ADL training;DME and/or AE instruction;Splinting;Ultrasound;Paraffin;Manual Therapy;Patient/family education;Therapeutic exercise;Coping strategies training    Consulted and Agree with Plan of Care Patient             Patient will benefit from skilled therapeutic intervention in order to improve the following deficits and impairments:   Body Structure / Function / Physical Skills: ADL, Decreased knowledge of use of DME, Pain, UE functional use, IADL, ROM, Flexibility       Visit  Diagnosis: Pain in right hand - Plan: Ot plan of care cert/re-cert  Stiffness of right hand, not elsewhere classified - Plan: Ot plan of care cert/re-cert    Problem List Patient Active Problem List   Diagnosis Date Noted   Status post total hip replacement, right 01/22/2021   Right hip pain 01/27/2018   Lumbar spinal stenosis 01/27/2018   Gluteal tendinitis, right hip 01/27/2018   Calculus of gallbladder with acute cholecystitis without obstruction 08/04/2017   Osteopenia 05/18/2015   METATARSALGIA 08/14/2008   PLANTAR FASCIITIS, RIGHT 08/14/2008   FOOT PAIN, RIGHT  08/14/2008    Rosalyn Gess, OTR/L,CLT 02/18/2022, 1:22 PM  [place] [facility] [address] [place], [address] Phone: [phone]   Fax:  [phone]  Name: SAMAIYAH HOWES MRN: [id number] Date of Birth: 01-Oct-1955

## 2022-03-10 ENCOUNTER — Ambulatory Visit: Payer: PPO | Attending: Internal Medicine | Admitting: Occupational Therapy

## 2022-03-10 DIAGNOSIS — M25641 Stiffness of right hand, not elsewhere classified: Secondary | ICD-10-CM | POA: Diagnosis not present

## 2022-03-10 DIAGNOSIS — M79641 Pain in right hand: Secondary | ICD-10-CM | POA: Insufficient documentation

## 2022-03-10 NOTE — Therapy (Signed)
[facility] [address], [place], [address] Phone: [phone]   Fax:  [phone]  Occupational Therapy Treatment  Patient Details  Name: Kathryn Hawkins MRN: [id number] Date of Birth: [birth year] Referring Provider (OT): Dr Emily Filbert   Encounter Date: 03/10/2022   OT End of Session - 03/10/22 1158     Visit Number 2    Number of Visits 5    Date for OT Re-Evaluation 04/15/22    OT Start Time 0947    OT Stop Time 1030    OT Time Calculation (min) 43 min    Activity Tolerance Patient tolerated treatment well    Behavior During Therapy [facility] for tasks assessed/performed             Past Medical History:  Diagnosis Date   DDD (degenerative disc disease), lumbar 2017   Diverticulitis    Dyspareunia in female    Herniated disc, cervical    Lumbar spinal stenosis    Melanoma ([place]) 1994   Left leg. Level II, per patient   Osteopenia 2016   Vaginal dryness    Vulvar atrophy     Past Surgical History:  Procedure Laterality Date   ABDOMINAL HYSTERECTOMY  1994   with bladder tack   BLADDER SUSPENSION     BREAST BIOPSY Right 03/24/2019   [place] bx coil clip BENIGN BREAST TISSUE WITH PSEUDO-ANGIOMATOUS STROMAL HYPERPLASIA, USUAL DUCTAL HYPERPLASIA AND FOCAL FIBROADENOMATOID CHANGES   BREAST BIOPSY Right 03/21/2021   [place] bx mass, coil marker, dense fibrous stroma   BREAST BIOPSY Left 03/21/2021   stereo bx asymmetry, x marker, benign tissue   CHOLECYSTECTOMY N/A 08/05/2017   Procedure: LAPAROSCOPIC CHOLECYSTECTOMY;  Surgeon: Vickie Epley, MD;  Location: [facility] ORS;  Service: General;  Laterality: N/A;   COLONOSCOPY     2008, 2018   CYSTOURETHROSCOPY  01/2014   urethrolysis transvaginal   FLEXIBLE SIGMOIDOSCOPY  1994   KELOID EXCISION Right 1974   LAPAROSCOPIC OVARIAN CYSTECTOMY Right 1994   MELANOMA EXCISION Left    leg   TOTAL HIP ARTHROPLASTY Right 01/22/2021   Procedure: TOTAL HIP  ARTHROPLASTY ANTERIOR APPROACH;  Surgeon: Hessie Knows, MD;  Location: [facility] ORS;  Service: Orthopedics;  Laterality: Right;   TUBAL LIGATION      There were no vitals filed for this visit.   Subjective Assessment - 03/10/22 1152     Subjective  I have been using the paraffin , and soft splint mostly at night itme- pain with bathroom use, and gripping large jars- doing okay - done the exercises    Pertinent History Patient was referred by Dr. Sabra Heck her PCP for right hand pain.  Patient reports increased pain in the right base of thumb 4/10 to 10/10 depending on what activity she does.  Never pain-free.  Patient do have an appointment with his hand surgeon in August or September.  But patient try to avoid surgery.  Patient referred to OT for right thumb pain.    Patient Stated Goals I want to trying avoid surgery if possible.  I want to you to help me with my motion my pain    Currently in Pain? No/denies                [facility] OT Assessment - 03/10/22 0001       AROM   Right Wrist Extension 80 Degrees    Right Wrist Flexion 95 Degrees    Left Wrist Extension 88  Degrees    Left Wrist Flexion 95 Degrees      Strength   Right Hand Grip (lbs) 57    Right Hand Lateral Pinch 11 lbs    Right Hand 3 Point Pinch 13 lbs    Left Hand Grip (lbs) 51    Left Hand Lateral Pinch 9 lbs    Left Hand 3 Point Pinch 12 lbs      Right Hand AROM   R Thumb Radial ABduction/ADduction 0-55 55    R Thumb Palmar ABduction/ADduction 0-45 70    R Thumb Opposition to Index --   Opposition to base of 5th - able to keep [place] out of palm              Patient  arrive after doing HEP for about 2 wks - with thumb pain not constant -and  show increased active range of motion for thumb palmar radial abduction . Pt using it mostly in am - pt to cont with paraffin  in the morning in the evening.   Patient to do 10 reps of palmar radial abduction as well as opposition-avoiding CMC collapse into carpal  tunnel. Husband to cont with soft tissue mobs for webspace and MC spreads- review with pt and husband again  Handout was provided at eval and reviewed with patient about joint protection as well as modifications to increase independency in ADLs and IADLs. Reviewed again - pt had questions on utencils, puzzle, hair clip- when her grand baby is born  - toilet hygiene and pulling up pants.  Patient to cont with a CMC neoprene brace to use with activities that cause pain or lingering afterwards. She is wearing it night time to support CMC out of palm because of sleeping with hand in fist.     Review tendon glides and thumb AROM-    Pt bilateral grip increase and R lat pinch - see flow sheet -  Patient to keep her appt next week with  hand surgeon.                    OT Education - 03/10/22 1157     Education Details progress and review HEP    Person(s) Educated Patient    Methods Explanation;Demonstration;Tactile cues;Verbal cues;Handout    Comprehension Returned demonstration;Verbalized understanding;Verbal cues required                 OT Long Term Goals - 03/10/22 1204       OT LONG TERM GOAL #1   Title Patient to be independent in home program to maintain or improve range of motion decrease pain to less than 4/10 at the most.    Baseline Pain in right thumb 5-10/10, pain constantly.  decrease endrange thumb palmar /radial abduction and as well as tightness in [facility] during opposition, no knowledge of home program NOW pain only with lat pinch and wide grip like jar - increase AROM in thumb PA and RA -and increase grip strenght bilateral - increase lat pinch on R    Status Achieved      OT LONG TERM GOAL #2   Title Patient to show decreased pain in right thumb to have increased lateral pinch by 2 pounds to do clip for hair    Baseline Pain 5-10/10 at base of thumb CMC, lateral pinch decreased right 7 pounds left 9 pounds  NOW lat pinch 11 lbs increase R , L 9 lbs - still  hard to do clip for  hair    Status Achieved      OT LONG TERM GOAL #3   Title Patient verbalized 3 joint protection or modifications that patient is doing at home and ADLs and IADLs to increase independence with less pain.    Baseline Patient has no soft neoprene splint, no modalities to decrease pain, no knowledge of modifications or adaptive equipment-pain 5-10/10 in the right thumb CMC - NOW pt report using joint protection , AE , paraffin and CMC neoprene    Status Achieved                   Plan - 03/10/22 1159     Clinical Impression Statement Patient referred to OT for right dominant hand thumb CMC pain.  Patient present with tenderness over [facility] as well as positive grinding test. Pt made progress the last 2 wks in AROM for thumb PA and RA as well as able to maintain thumb CMC out of palm during prehension. Cont to have mostly pain with last pinch but grip increase as well as lat pinch on the R. Pt to cont to use paraffin to decrease pain and increase mobility.  To cont with AROM to maintain range of motion and strength in bilateral hands to be able to perform her ADLs and IADLs more independent with less pain.  Patient was also provided information and handout on joint protection and modification. Pt had some questions on ADLs and IADL's - recommendations was provided. Pt to cont with CMC neoprene to use during the day with functional task that cause pain at the [facility] and can also use night time if feels like it keeps [facility] out of palm. Pt to cont with homeprogram.  Pt is trying to avoid surgery - but did encourange her to maintain her appt with Dr Amedeo Plenty next week.    OT Occupational Profile and History Problem Focused Assessment - Including review of records relating to presenting problem    Occupational performance deficits (Please refer to evaluation for details): ADL's;IADL's;Work;Play;Leisure;Social Participation    Body Structure / Function / Physical Skills ADL;Decreased knowledge  of use of DME;Pain;UE functional use;IADL;ROM;Flexibility    Rehab Potential Good    Clinical Decision Making Limited treatment options, no task modification necessary    Comorbidities Affecting Occupational Performance: None    Modification or Assistance to Complete Evaluation  No modification of tasks or assist necessary to complete eval    OT Frequency Monthly    OT Duration 4 weeks    OT Treatment/Interventions Self-care/ADL training;DME and/or AE instruction;Splinting;Ultrasound;Paraffin;Manual Therapy;Patient/family education;Therapeutic exercise;Coping strategies training    Consulted and Agree with Plan of Care Patient             Patient will benefit from skilled therapeutic intervention in order to improve the following deficits and impairments:   Body Structure / Function / Physical Skills: ADL, Decreased knowledge of use of DME, Pain, UE functional use, IADL, ROM, Flexibility       Visit Diagnosis: Pain in right hand  Stiffness of right hand, not elsewhere classified    Problem List Patient Active Problem List   Diagnosis Date Noted   Status post total hip replacement, 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 [place], [address] Phone: [phone]   Fax:  [phone]  Name: JESSIA KIEF MRN: [id number] Date of Birth: 16-Dec-1955

## 2022-03-19 DIAGNOSIS — M79645 Pain in left finger(s): Secondary | ICD-10-CM | POA: Diagnosis not present

## 2022-03-19 DIAGNOSIS — M79644 Pain in right finger(s): Secondary | ICD-10-CM | POA: Diagnosis not present

## 2022-03-19 DIAGNOSIS — M13841 Other specified arthritis, right hand: Secondary | ICD-10-CM | POA: Diagnosis not present

## 2022-05-20 DIAGNOSIS — R399 Unspecified symptoms and signs involving the genitourinary system: Secondary | ICD-10-CM | POA: Diagnosis not present

## 2022-05-29 DIAGNOSIS — K2990 Gastroduodenitis, unspecified, without bleeding: Secondary | ICD-10-CM | POA: Diagnosis not present

## 2022-05-29 DIAGNOSIS — F430 Acute stress reaction: Secondary | ICD-10-CM | POA: Diagnosis not present

## 2022-10-01 ENCOUNTER — Ambulatory Visit: Payer: PPO | Admitting: Dermatology

## 2022-10-01 VITALS — BP 101/63

## 2022-10-01 DIAGNOSIS — L814 Other melanin hyperpigmentation: Secondary | ICD-10-CM | POA: Diagnosis not present

## 2022-10-01 DIAGNOSIS — Z85820 Personal history of malignant melanoma of skin: Secondary | ICD-10-CM | POA: Diagnosis not present

## 2022-10-01 DIAGNOSIS — L821 Other seborrheic keratosis: Secondary | ICD-10-CM

## 2022-10-01 DIAGNOSIS — Z1283 Encounter for screening for malignant neoplasm of skin: Secondary | ICD-10-CM | POA: Diagnosis not present

## 2022-10-01 DIAGNOSIS — D229 Melanocytic nevi, unspecified: Secondary | ICD-10-CM

## 2022-10-01 DIAGNOSIS — L578 Other skin changes due to chronic exposure to nonionizing radiation: Secondary | ICD-10-CM

## 2022-10-01 NOTE — Progress Notes (Signed)
   Follow-Up Visit   Subjective  Kathryn Hawkins is a 67 y.o. female who presents for the following: Annual Exam (History of melanoma left leg 1994 - The patient presents for Total-Body Skin Exam (TBSE) for skin cancer screening and mole check.  The patient has spots, moles and lesions to be evaluated, some may be new or changing and the patient has concerns that these could be cancer./).  The following portions of the chart were reviewed this encounter and updated as appropriate:   Tobacco  Allergies  Meds  Problems  Med Hx  Surg Hx  Fam Hx     Review of Systems:  No other skin or systemic complaints except as noted in HPI or Assessment and Plan.  Objective  Well appearing patient in no apparent distress; mood and affect are within normal limits.  A full examination was performed including scalp, head, eyes, ears, nose, lips, neck, chest, axillae, abdomen, back, buttocks, bilateral upper extremities, bilateral lower extremities, hands, feet, fingers, toes, fingernails, and toenails. All findings within normal limits unless otherwise noted below.   Assessment & Plan   Lentigines - Scattered tan macules - Due to sun exposure - Benign-appearing, observe - Recommend daily broad spectrum sunscreen SPF 30+ to sun-exposed areas, reapply every 2 hours as needed. - Call for any changes  Seborrheic Keratoses - Stuck-on, waxy, tan-brown papules and/or plaques  - Benign-appearing - Discussed benign etiology and prognosis. - Observe - Call for any changes  Melanocytic Nevi - Tan-brown and/or pink-flesh-colored symmetric macules and papules - Benign appearing on exam today - Observation - Call clinic for new or changing moles - Recommend daily use of broad spectrum spf 30+ sunscreen to sun-exposed areas.   Hemangiomas - Red papules - Discussed benign nature - Observe - Call for any changes  Actinic Damage - Chronic condition, secondary to cumulative UV/sun exposure - diffuse  scaly erythematous macules with underlying dyspigmentation - Recommend daily broad spectrum sunscreen SPF 30+ to sun-exposed areas, reapply every 2 hours as needed.  - Staying in the shade or wearing long sleeves, sun glasses (UVA+UVB protection) and wide brim hats (4-inch brim around the entire circumference of the hat) are also recommended for sun protection.  - Call for new or changing lesions.  Skin cancer screening performed today.  History of melanoma Left lower leg History of Melanoma - No evidence of recurrence today - No lymphadenopathy - Recommend regular full body skin exams - Recommend daily broad spectrum sunscreen SPF 30+ to sun-exposed areas, reapply every 2 hours as needed.  - Call if any new or changing lesions are noted between office visits  Return in about 1 year (around 10/02/2023) for TBSE.  I, Ashok Cordia, CMA, am acting as scribe for Sarina Ser, MD . Documentation: I have reviewed the above documentation for accuracy and completeness, and I agree with the above.  Sarina Ser, MD

## 2022-10-01 NOTE — Patient Instructions (Signed)
Due to recent changes in healthcare laws, you may see results of your pathology and/or laboratory studies on MyChart before the doctors have had a chance to review them. We understand that in some cases there may be results that are confusing or concerning to you. Please understand that not all results are received at the same time and often the doctors may need to interpret multiple results in order to provide you with the best plan of care or course of treatment. Therefore, we ask that you please give us 2 business days to thoroughly review all your results before contacting the office for clarification. Should we see a critical lab result, you will be contacted sooner.   If You Need Anything After Your Visit  If you have any questions or concerns for your doctor, please call our main line at [phone] and press option 4 to reach your doctor's medical assistant. If no one answers, please leave a voicemail as directed and we will return your call as soon as possible. Messages left after 4 pm will be answered the following business day.   You may also send us a message via MyChart. We typically respond to MyChart messages within 1-2 business days.  For prescription refills, please ask your pharmacy to contact our office. Our fax number is [phone].  If you have an urgent issue when the clinic is closed that cannot wait until the next business day, you can page your doctor at the number below.    Please note that while we do our best to be available for urgent issues outside of office hours, we are not available 24/7.   If you have an urgent issue and are unable to reach us, you may choose to seek medical care at your doctor's office, retail clinic, urgent care center, or emergency room.  If you have a medical emergency, please immediately call 911 or go to the emergency department.  Pager Numbers  - Dr. Kowalski: [phone]  - Dr. Moye: [phone]  - Dr. Stewart:  [phone]  In the event of inclement weather, please call our main line at [phone] for an update on the status of any delays or closures.  Dermatology Medication Tips: Please keep the boxes that topical medications come in in order to help keep track of the instructions about where and how to use these. Pharmacies typically print the medication instructions only on the boxes and not directly on the medication tubes.   If your medication is too expensive, please contact our office at [phone] option 4 or send us a message through MyChart.   We are unable to tell what your co-pay for medications will be in advance as this is different depending on your insurance coverage. However, we may be able to find a substitute medication at lower cost or fill out paperwork to get insurance to cover a needed medication.   If a prior authorization is required to get your medication covered by your insurance company, please allow us 1-2 business days to complete this process.  Drug prices often vary depending on where the prescription is filled and some pharmacies may offer cheaper prices.  The website www.goodrx.com contains coupons for medications through different pharmacies. The prices here do not account for what the cost may be with help from insurance (it may be cheaper with your insurance), but the website can give you the price if you did not use any insurance.  - You can print the associated coupon and take it with   your prescription to the pharmacy.  - You may also stop by our office during regular business hours and pick up a GoodRx coupon card.  - If you need your prescription sent electronically to a different pharmacy, notify our office through [facility] MyChart or by phone at [phone] option 4.     Si Usted Necesita Algo Despus de Su Visita  Tambin puede enviarnos un mensaje a travs de MyChart. Por lo general respondemos a los mensajes de MyChart en el transcurso de 1 a 2  das hbiles.  Para renovar recetas, por favor pida a su farmacia que se ponga en contacto con nuestra oficina. Nuestro nmero de fax es el [phone].  Si tiene un asunto urgente cuando la clnica est cerrada y que no puede esperar hasta el siguiente da hbil, puede llamar/localizar a su doctor(a) al nmero que aparece a continuacin.   Por favor, tenga en cuenta que aunque hacemos todo lo posible para estar disponibles para asuntos urgentes fuera del horario de oficina, no estamos disponibles las 24 horas del da, los 7 das de la semana.   Si tiene un problema urgente y no puede comunicarse con nosotros, puede optar por buscar atencin mdica  en el consultorio de su doctor(a), en una clnica privada, en un centro de atencin urgente o en una sala de emergencias.  Si tiene una emergencia mdica, por favor llame inmediatamente al 911 o vaya a la sala de emergencias.  Nmeros de bper  - Dr. Kowalski: [phone]  - Dra. Moye: [phone]  - Dra. Stewart: [phone]  En caso de inclemencias del tiempo, por favor llame a nuestra lnea principal al [phone] para una actualizacin sobre el estado de cualquier retraso o cierre.  Consejos para la medicacin en dermatologa: Por favor, guarde las cajas en las que vienen los medicamentos de uso tpico para ayudarle a seguir las instrucciones sobre dnde y cmo usarlos. Las farmacias generalmente imprimen las instrucciones del medicamento slo en las cajas y no directamente en los tubos del medicamento.   Si su medicamento es muy caro, por favor, pngase en contacto con nuestra oficina llamando al [phone] y presione la opcin 4 o envenos un mensaje a travs de MyChart.   No podemos decirle cul ser su copago por los medicamentos por adelantado ya que esto es diferente dependiendo de la cobertura de su seguro. Sin embargo, es posible que podamos encontrar un medicamento sustituto a menor costo o llenar un formulario para que el  seguro cubra el medicamento que se considera necesario.   Si se requiere una autorizacin previa para que su compaa de seguros cubra su medicamento, por favor permtanos de 1 a 2 das hbiles para completar este proceso.  Los precios de los medicamentos varan con frecuencia dependiendo del lugar de dnde se surte la receta y alguna farmacias pueden ofrecer precios ms baratos.  El sitio web www.goodrx.com tiene cupones para medicamentos de diferentes farmacias. Los precios aqu no tienen en cuenta lo que podra costar con la ayuda del seguro (puede ser ms barato con su seguro), pero el sitio web puede darle el precio si no utiliz ningn seguro.  - Puede imprimir el cupn correspondiente y llevarlo con su receta a la farmacia.  - Tambin puede pasar por nuestra oficina durante el horario de atencin regular y recoger una tarjeta de cupones de GoodRx.  - Si necesita que su receta se enve electrnicamente a una farmacia diferente, informe a nuestra oficina a travs de MyChart de [place]   o por telfono llamando al [phone] y presione la opcin 4.  

## 2022-10-08 ENCOUNTER — Encounter: Payer: Self-pay | Admitting: Dermatology

## 2022-10-15 DIAGNOSIS — E538 Deficiency of other specified B group vitamins: Secondary | ICD-10-CM | POA: Diagnosis not present

## 2022-10-15 DIAGNOSIS — E782 Mixed hyperlipidemia: Secondary | ICD-10-CM | POA: Diagnosis not present

## 2022-10-15 DIAGNOSIS — E559 Vitamin D deficiency, unspecified: Secondary | ICD-10-CM | POA: Diagnosis not present

## 2022-10-20 ENCOUNTER — Ambulatory Visit: Payer: PPO | Admitting: Dermatology

## 2022-10-22 DIAGNOSIS — Z0000 Encounter for general adult medical examination without abnormal findings: Secondary | ICD-10-CM | POA: Diagnosis not present

## 2022-10-22 DIAGNOSIS — E782 Mixed hyperlipidemia: Secondary | ICD-10-CM | POA: Diagnosis not present

## 2022-11-24 DIAGNOSIS — R319 Hematuria, unspecified: Secondary | ICD-10-CM | POA: Diagnosis not present

## 2022-12-12 ENCOUNTER — Ambulatory Visit (INDEPENDENT_AMBULATORY_CARE_PROVIDER_SITE_OTHER): Payer: PPO | Admitting: Urology

## 2022-12-12 ENCOUNTER — Other Ambulatory Visit: Payer: Self-pay

## 2022-12-12 ENCOUNTER — Other Ambulatory Visit
Admission: RE | Admit: 2022-12-12 | Discharge: 2022-12-12 | Disposition: A | Discharge status: Home / Self Care | Payer: PPO | Attending: Urology | Admitting: Urology

## 2022-12-12 VITALS — BP 109/70 | HR 72 | Ht 65.0 in | Wt 127.0 lb

## 2022-12-12 DIAGNOSIS — R3129 Other microscopic hematuria: Secondary | ICD-10-CM | POA: Diagnosis not present

## 2022-12-12 DIAGNOSIS — R310 Gross hematuria: Secondary | ICD-10-CM

## 2022-12-12 LAB — URINALYSIS, COMPLETE (UACMP) WITH MICROSCOPIC
Glucose, UA: NEGATIVE mg/dL
Ketones, ur: NEGATIVE mg/dL
Leukocytes,Ua: NEGATIVE
Nitrite: NEGATIVE
Protein, ur: NEGATIVE mg/dL
Specific Gravity, Urine: <1.005 — ABNORMAL LOW (ref 1.005–1.030)
pH: 5.5 (ref 5.0–8.0)

## 2022-12-12 NOTE — Progress Notes (Signed)
I,Amy L Pierron,acting as a scribe for [name], MD.,have documented all relevant documentation on the behalf of [name], MD,as directed by  [name], MD while in the presence of [name], MD.  12/12/2022 10:52 PM   Kathryn Hawkins 02/11/1956 [id number]  Referring provider: Danella Penton, MD [phone] [facility] [address] [facility] [place],  [place] [address]  Chief Complaint  Patient presents with   New Patient (Initial Visit)    HPI: 67 year-old female who presents today for further evaluation of microscopic hematuria.  She had a urinalysis on 11/24/2022 that showed 5 red blood cells per high-powered field, otherwise unremarkable. She also had one on 10/15/2022 that had 13 red blood cells per high-powered field. She has no recent upper tract imaging for review.  She thinks she may have seen blood in her urine one time. She had a kidney stone about 50 years ago. She had second hand tobacco exposure in the home for at least 30 years. She has a personal history of tobacco use for 1 year 50 years ago. She had an oophorectomy in 2004. She had bladder issues for about 11 years with multiple UTI's, frequent urination, etc. She went to a urogynecologist and had a sling put in back in 2015. She has gone to a pelvic floor physical therapist which has helped tremendously.   Results for orders placed or performed during the hospital encounter of 12/12/22  Urinalysis, Complete w Microscopic -  Result Value Ref Range   Color, Urine YELLOW YELLOW   APPearance CLEAR CLEAR   Specific Gravity, Urine <1.005 (L) 1.005 - 1.030   pH 5.5 5.0 - 8.0   Glucose, UA NEGATIVE NEGATIVE mg/dL   Hgb urine dipstick SMALL (A) NEGATIVE   Bilirubin Urine LARGE (A) NEGATIVE   Ketones, ur NEGATIVE NEGATIVE mg/dL   Protein, ur NEGATIVE NEGATIVE mg/dL   Nitrite NEGATIVE NEGATIVE   Leukocytes,Ua NEGATIVE NEGATIVE   Squamous Epithelial / HPF 0-5 0 - 5 /HPF   WBC, UA 0-5 0 - 5  WBC/hpf   RBC / HPF 0-5 0 - 5 RBC/hpf   Bacteria, UA RARE (A) NONE SEEN    PMH: Past Medical History:  Diagnosis Date   DDD (degenerative disc disease), lumbar 2017   Diverticulitis    Dyspareunia in female    Herniated disc, cervical    Lumbar spinal stenosis    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, per patient   Osteopenia 2016   Vaginal dryness    Vulvar atrophy     Surgical History: Past Surgical History:  Procedure Laterality Date   ABDOMINAL HYSTERECTOMY  1994   with bladder tack   BLADDER SUSPENSION     BREAST BIOPSY Right 03/24/2019   [place] bx coil clip BENIGN BREAST TISSUE WITH PSEUDO-ANGIOMATOUS STROMAL HYPERPLASIA, USUAL DUCTAL HYPERPLASIA AND FOCAL FIBROADENOMATOID CHANGES   BREAST BIOPSY Right 03/21/2021   [place] bx mass, coil marker, dense fibrous stroma   BREAST BIOPSY Left 03/21/2021   stereo bx asymmetry, x marker, benign tissue   CHOLECYSTECTOMY N/A 08/05/2017   Procedure: LAPAROSCOPIC CHOLECYSTECTOMY;  Surgeon: Ancil Linsey, MD;  Location: [facility] ORS;  Service: General;  Laterality: N/A;   COLONOSCOPY     2008, 2018   CYSTOURETHROSCOPY  01/2014   urethrolysis transvaginal   FLEXIBLE SIGMOIDOSCOPY  1994   KELOID EXCISION Right 1974   LAPAROSCOPIC OVARIAN CYSTECTOMY Right 1994   MELANOMA EXCISION Left    leg   TOTAL  HIP ARTHROPLASTY Right 01/22/2021   Procedure: TOTAL HIP ARTHROPLASTY ANTERIOR APPROACH;  Surgeon: Kennedy Bucker, MD;  Location: [facility] OR[facility];  Service: Orthopedics;  Laterality: Right;   TUBAL LIGATION      Home Medications:  Allergies as of 12/12/2022       Reactions   Cobalt    Allergy test    Thimerosal Rash   Nickel    Allergy test    Penicillins Other (See Comments)   Yeast Infection   Thimerosal (thiomersal)    Allergy test        Medication List        Accurate as of Dec 12, 2022 10:52 PM. If you have any questions, ask your nurse or doctor.          STOP taking these medications    buPROPion 150 MG 12 hr  tablet Commonly known as: WELLBUTRIN SR Stopped by: [name], MD   docusate sodium 100 MG capsule Commonly known as: COLACE Stopped by: [name], MD   enoxaparin 40 MG/0.4ML injection Commonly known as: LOVENOX Stopped by: [name], MD   HYDROcodone-acetaminophen 5-325 MG tablet Commonly known as: NORCO/VICODIN Stopped by: [name], MD   Tavaborole 5 % Soln Commonly known as: Kerydin Stopped by: [name], MD   traMADol 50 MG tablet Commonly known as: ULTRAM Stopped by: [name], MD       TAKE these medications    CALCIUM-MAGNESIUM-VITAMIN D PO Take 3 tablets by mouth daily.   cyclobenzaprine 5 MG tablet Commonly known as: FLEXERIL Take 5 mg by mouth 3 (three) times daily as needed for muscle spasms.   estradiol 0.1 MG/GM vaginal cream Commonly known as: ESTRACE VAGINAL Place 1 Applicatorful vaginally at bedtime. Apply 1 gm per vagina every night for 2 weeks, then apply three times weekly. What changed:  when to take this additional instructions   estradiol 0.5 MG tablet Commonly known as: ESTRACE TAKE ONE TABLET BY MOUTH EVERY DAY for 21 days then take none for 7 days What changed:  how much to take how to take this when to take this   ETODOLAC PO Take by mouth.   GLUCOSAMINE CHONDR 1500 COMPLX PO Take 2 tablets by mouth daily.   ketoconazole 2 % cream Commonly known as: NIZORAL Apply 1 application topically daily as needed for irritation.   multivitamin with minerals tablet Take 1 tablet by mouth daily.   PROBIOTIC DAILY PO Take 1 capsule by mouth daily. 50 Billion   progesterone 100 MG capsule Commonly known as: PROMETRIUM Take by mouth.   progesterone 200 MG capsule Commonly known as: PROMETRIUM TAKE 1 CAPSULE EVERY DAY What changed:  when to take this additional instructions        Allergies:  Allergies  Allergen Reactions   Cobalt     Allergy test    Thimerosal Rash   Nickel     Allergy  test    Penicillins Other (See Comments)    Yeast Infection   Thimerosal (Thiomersal)     Allergy test    Family History: Family History  Problem Relation Age of Onset   Breast cancer Paternal Grandmother 46    Social History:  reports that she has never smoked. She has never used smokeless tobacco. She reports current alcohol use. She reports that she does not use drugs.   Physical Exam: BP 109/70   Pulse 72   Ht 5\' 5"  (1.651 m)   Wt 127 lb (57.6 kg)   BMI  21.13 kg/m   Constitutional:  Alert and oriented, No acute distress. HEENT: [place] AT, moist mucus membranes.  Trachea midline, no masses. Neurologic: Grossly intact, no focal deficits, moving all 4 extremities. Psychiatric: Normal mood and affect.  Urinalysis    Component Value Date/Time   COLORURINE YELLOW 12/12/2022 1550   APPEARANCEUR CLEAR 12/12/2022 1550   LABSPEC <1.005 (L) 12/12/2022 1550   PHURINE 5.5 12/12/2022 1550   GLUCOSEU NEGATIVE 12/12/2022 1550   HGBUR SMALL (A) 12/12/2022 1550   BILIRUBINUR LARGE (A) 12/12/2022 1550   BILIRUBINUR neg 03/31/2017 1557   KETONESUR NEGATIVE 12/12/2022 1550   PROTEINUR NEGATIVE 12/12/2022 1550   UROBILINOGEN 0.2 03/31/2017 1557   NITRITE NEGATIVE 12/12/2022 1550   LEUKOCYTESUR NEGATIVE 12/12/2022 1550    Lab Results  Component Value Date   BACTERIA RARE (A) 12/12/2022    Assessment & Plan:    Microscopic hematuria  - We discussed the differential diagnosis for microscopic hematuria including nephrolithiasis, renal or upper tract tumors, bladder stones, UTIs, or bladder tumors as well as undetermined etiologies. Per AUA guidelines, I did recommend complete microscopic hematuria evaluation including CTU, possible urine cytology, and office cystoscopy.  - She prefers to get to the bottom of an issue as soon as possible so she is in agreement to having the CT urogram and then a Cystoscopy.    Return in about 4 weeks (around 01/09/2023) for cysto.   [facility] [address] [place], [place] [address] [phone]

## 2022-12-15 NOTE — Addendum Note (Signed)
Addended by: Consuella Lose on: 12/15/2022 03:45 PM   Modules accepted: Orders

## 2022-12-23 ENCOUNTER — Ambulatory Visit: Payer: PPO | Admitting: Urology

## 2022-12-25 ENCOUNTER — Ambulatory Visit
Admission: RE | Admit: 2022-12-25 | Discharge: 2022-12-25 | Disposition: A | Discharge status: Home / Self Care | Payer: PPO | Source: Ambulatory Visit | Attending: Urology | Admitting: Urology

## 2022-12-25 DIAGNOSIS — R319 Hematuria, unspecified: Secondary | ICD-10-CM | POA: Diagnosis not present

## 2022-12-25 DIAGNOSIS — R310 Gross hematuria: Secondary | ICD-10-CM | POA: Insufficient documentation

## 2022-12-25 LAB — POCT I-STAT CREATININE: Creatinine, Ser: 0.8 mg/dL (ref 0.44–1.00)

## 2022-12-25 MED ORDER — IOHEXOL 300 MG/ML  SOLN
150.0000 mL | Freq: Once | INTRAMUSCULAR | Status: AC | PRN
  Administered 2022-12-25: 125 mL via INTRAVENOUS

## 2022-12-31 ENCOUNTER — Ambulatory Visit: Payer: PPO | Admitting: Urology

## 2023-01-02 ENCOUNTER — Other Ambulatory Visit: Payer: Self-pay | Admitting: Urology

## 2023-01-02 ENCOUNTER — Other Ambulatory Visit
Admission: RE | Admit: 2023-01-02 | Discharge: 2023-01-02 | Disposition: A | Discharge status: Home / Self Care | Payer: PPO | Attending: Urology | Admitting: Urology

## 2023-01-02 ENCOUNTER — Other Ambulatory Visit: Payer: Self-pay

## 2023-01-02 ENCOUNTER — Ambulatory Visit (INDEPENDENT_AMBULATORY_CARE_PROVIDER_SITE_OTHER): Payer: PPO | Admitting: Urology

## 2023-01-02 VITALS — BP 115/77 | HR 87 | Ht 65.0 in | Wt 127.0 lb

## 2023-01-02 DIAGNOSIS — R3129 Other microscopic hematuria: Secondary | ICD-10-CM

## 2023-01-02 DIAGNOSIS — Z01818 Encounter for other preprocedural examination: Secondary | ICD-10-CM

## 2023-01-02 DIAGNOSIS — R310 Gross hematuria: Secondary | ICD-10-CM | POA: Insufficient documentation

## 2023-01-02 DIAGNOSIS — N3289 Other specified disorders of bladder: Secondary | ICD-10-CM

## 2023-01-02 LAB — URINALYSIS, COMPLETE (UACMP) WITH MICROSCOPIC
Glucose, UA: NEGATIVE mg/dL
Ketones, ur: NEGATIVE mg/dL
Leukocytes,Ua: NEGATIVE
Nitrite: NEGATIVE
Protein, ur: NEGATIVE mg/dL
Specific Gravity, Urine: 1.01 (ref 1.005–1.030)
pH: 6.5 (ref 5.0–8.0)

## 2023-01-02 NOTE — H&P (View-Only) (Signed)
   01/02/23  CC:  Chief Complaint  Patient presents with   Cysto    HPI: 66-year-old female with gross microscopic hematuria who presents today for cystoscopy.  She underwent CT urogram which was unremarkable.  Blood pressure 115/77, pulse 87, height 5' 5" (1.651 m), weight 127 lb (57.6 kg). NED. A&Ox3.   No respiratory distress   Abd soft, NT, ND Normal external genitalia with patent urethral meatus  Cystoscopy Procedure Note  Patient identification was confirmed, informed consent was obtained, and patient was prepped using Betadine solution.  Lidocaine jelly was administered per urethral meatus.    Procedure: - Flexible cystoscope introduced, without any difficulty.   - Thorough search of the bladder revealed:    normal urethral meatus    Approximately 3 cm across the posterior bladder wall which is hyperemic and distinctly different for the remainder of her bladder    no stones    no ulcers     no tumors    no urethral polyps    no trabeculation  - Ureteral orifices were normal in position and appearance.  Post-Procedure: - Patient tolerated the procedure well  Assessment/ Plan:  1. Erythematous bladder mucosa Differential diagnosis includes cystitis versus CIS of the bladder.  I have recommended proceeding to the operating room for cystoscopy and bladder biopsy.  We discussed this is a minor outpatient procedure.  Risk including bleeding, infection demonstrating structures discussed.  Will plan on calling her in a few days after the biopsy with results.  She is agreeable this plan.   Dam Ashraf, MD 

## 2023-01-02 NOTE — Progress Notes (Unsigned)
Surgical Physician Order Form [facility]  * Scheduling expectation : Next Available  *Length of Case:   *Clearance needed: no  *Anticoagulation Instructions: Hold all anticoagulants  *Aspirin Instructions: N/A  *Post-op visit Date/Instructions:   call with results  *Diagnosis:  bladder erythema  *Procedure:     Cysto Bladder Biopsy (40981)   Additional orders: N/A  -Admit type: OUTpatient  -Anesthesia: MAC  -VTE Prophylaxis Standing Order SCD's       Other:   -Standing Lab Orders Per Anesthesia    Lab other: None  -Standing Test orders EKG/Chest x-ray per Anesthesia       Test other:   - Medications:  Ancef 2gm IV  -Other orders:  N/A

## 2023-01-02 NOTE — Patient Instructions (Signed)
You have a red patch in your bladder.  This could be simple inflammation or could be a type of bladder cancer called CIS.  To know this, we need to pursue biopsy.  Biopsy is a minor outpatient procedure.

## 2023-01-02 NOTE — Progress Notes (Signed)
   01/02/23  CC:  Chief Complaint  Patient presents with   Cysto    HPI: 67 year old female with gross microscopic hematuria who presents today for cystoscopy.  She underwent CT urogram which was unremarkable.  Blood pressure 115/77, pulse 87, height 5\' 5"  (1.651 m), weight 127 lb (57.6 kg). NED. A&Ox3.   No respiratory distress   Abd soft, NT, ND Normal external genitalia with patent urethral meatus  Cystoscopy Procedure Note  Patient identification was confirmed, informed consent was obtained, and patient was prepped using Betadine solution.  Lidocaine jelly was administered per urethral meatus.    Procedure: - Flexible cystoscope introduced, without any difficulty.   - Thorough search of the bladder revealed:    normal urethral meatus    Approximately 3 cm across the posterior bladder wall which is hyperemic and distinctly different for the remainder of her bladder    no stones    no ulcers     no tumors    no urethral polyps    no trabeculation  - Ureteral orifices were normal in position and appearance.  Post-Procedure: - Patient tolerated the procedure well  Assessment/ Plan:  1. Erythematous bladder mucosa Differential diagnosis includes cystitis versus CIS of the bladder.  I have recommended proceeding to the operating room for cystoscopy and bladder biopsy.  We discussed this is a minor outpatient procedure.  Risk including bleeding, infection demonstrating structures discussed.  Will plan on calling her in a few days after the biopsy with results.  She is agreeable this plan.   [name], MD

## 2023-01-03 LAB — URINE CULTURE

## 2023-01-04 LAB — URINE CULTURE: Culture: NO GROWTH

## 2023-01-05 ENCOUNTER — Telehealth: Payer: Self-pay

## 2023-01-05 NOTE — Telephone Encounter (Signed)
I spoke with Mrs. Kathryn Hawkins. We have discussed possible surgery dates and Thursday June 13th, 2024 was agreed upon by all parties. Patient given information about surgery date, what to expect pre-operatively and post operatively.  We discussed that a Pre-Admission Testing office will be calling to set up the pre-op visit that will take place prior to surgery, and that these appointments are typically done over the phone with a Pre-Admissions RN. Informed patient that our office will communicate any additional care to be provided after surgery. Patients questions or concerns were discussed during our call. Advised to call our office should there be any additional information, questions or concerns that arise. Patient verbalized understanding.

## 2023-01-05 NOTE — Progress Notes (Signed)
   [facility] Surgical Posting Form  Surgery Date: Date: 01/15/2023  Surgeon: Dr. [name], MD  Inpt ( No  )   Outpt (Yes)   Obs ( No  )   Diagnosis: N32.89 Bladder Erythema  -CPT: 52204  Surgery: Cystoscopy with Bladder Biopsy  Stop Anticoagulations: Yes and also hold ASA  Cardiac/Medical/Pulmonary Clearance needed: no  *Orders entered into EPIC  Date: 01/05/23   *Case booked in EPIC  Date: 01/05/23  *Notified pt of Surgery: Date: 01/05/23  PRE-OP UA & CX: no  *Placed into Prior Authorization Work [place] Date: 01/05/23  Assistant/laser/rep:No

## 2023-01-06 DIAGNOSIS — H43813 Vitreous degeneration, bilateral: Secondary | ICD-10-CM | POA: Diagnosis not present

## 2023-01-06 DIAGNOSIS — H2513 Age-related nuclear cataract, bilateral: Secondary | ICD-10-CM | POA: Diagnosis not present

## 2023-01-06 DIAGNOSIS — D3131 Benign neoplasm of right choroid: Secondary | ICD-10-CM | POA: Diagnosis not present

## 2023-01-08 ENCOUNTER — Encounter
Admission: RE | Admit: 2023-01-08 | Discharge: 2023-01-08 | Disposition: A | Discharge status: Home / Self Care | Payer: PPO | Source: Ambulatory Visit | Attending: Urology | Admitting: Urology

## 2023-01-08 ENCOUNTER — Other Ambulatory Visit: Payer: Self-pay

## 2023-01-08 VITALS — Ht 65.0 in | Wt 125.0 lb

## 2023-01-08 DIAGNOSIS — Z01818 Encounter for other preprocedural examination: Secondary | ICD-10-CM

## 2023-01-08 HISTORY — DX: Dislocation of jaw, unspecified side, initial encounter: S03.00XA

## 2023-01-08 NOTE — Patient Instructions (Addendum)
Your procedure is scheduled on: Thursday 01/15/23 To find out your arrival time, please call [phone] between 1PM - 3PM on:  Wednesday 01/14/23  Report to the Registration Desk on the 1st floor of the Medical Mall. Free Valet parking is available.  If your arrival time is 6:00 am, do not arrive before that time as the Medical Mall entrance doors do not open until 6:00 am.  REMEMBER: Instructions that are not followed completely may result in serious medical risk, up to and including death; or upon the discretion of your surgeon and anesthesiologist your surgery may need to be rescheduled.  Do not eat food or drink any liquids after midnight the night before surgery.  No gum chewing or hard candies.  One week prior to surgery: Stop Anti-inflammatories (NSAIDS) such as Advil, Aleve, Ibuprofen, Motrin, Naproxen, Naprosyn and Aspirin based products such as Excedrin, Goody's Powder, BC Powder. Hold the Etodolac starting today 01/08/23 You may however, continue to take Tylenol if needed for pain up until the day of surgery.  Stop ANY OVER THE COUNTER supplements until after surgery.  Continue taking all prescribed medications with the exception of the following:  TAKE ONLY THESE MEDICATIONS THE MORNING OF SURGERY WITH A SIP OF WATER:  none  No Alcohol for 24 hours before or after surgery.  No Smoking including e-cigarettes for 24 hours before surgery.  No chewable tobacco products for at least 6 hours before surgery.  No nicotine patches on the day of surgery.  Do not use any "recreational" drugs for at least a week (preferably 2 weeks) before your surgery.  Please be advised that the combination of cocaine and anesthesia may have negative outcomes, up to and including death. If you test positive for cocaine, your surgery will be cancelled.  On the morning of surgery brush your teeth with toothpaste and water, you may rinse your mouth with mouthwash if you wish. Do not swallow any  toothpaste or mouthwash.  Use CHG Soap or wipes as directed on instruction sheet. Shower with your regular soap.  Do not wear lotions, powders, or perfumes.   Do not shave body hair from the neck down 48 hours before surgery.  Wear comfortable clothing (specific to your surgery type) to the hospital.  Do not wear jewelry, make-up, hairpins, clips or nail polish.  Contact lenses, hearing aids and dentures may not be worn into surgery.  Do not bring valuables to the hospital. [facility] is not responsible for any missing/lost belongings or valuables.   Notify your doctor if there is any change in your medical condition (cold, fever, infection).  If you are being discharged the day of surgery, you will not be allowed to drive home. You will need a responsible individual to drive you home and stay with you for 24 hours after surgery.   If you are taking public transportation, you will need to have a responsible individual with you.  If you are being admitted to the hospital overnight, leave your suitcase in the car. After surgery it may be brought to your room.  In case of increased patient census, it may be necessary for you, the patient, to continue your postoperative care in the Same Day Surgery department.  After surgery, you can help prevent lung complications by doing breathing exercises.  Take deep breaths and cough every 1-2 hours. Your doctor may order a device called an Incentive Spirometer to help you take deep breaths. When coughing or sneezing, hold a pillow firmly  against your incision with both hands. This is called "splinting." Doing this helps protect your incision. It also decreases belly discomfort.  Surgery Visitation Policy:  Patients undergoing a surgery or procedure may have two family members or support persons with them as long as the person is not COVID-19 positive or experiencing its symptoms.   Inpatient Visitation:    Visiting hours are 7 a.m. to 8  p.m. Up to four visitors are allowed at one time in a patient room. The visitors may rotate out with other people during the day. One designated support person (adult) may remain overnight.  Please call the Pre-admissions Testing Dept. at [phone] if you have any questions about these instructions.

## 2023-01-13 ENCOUNTER — Encounter: Payer: Self-pay | Admitting: Urgent Care

## 2023-01-13 ENCOUNTER — Encounter
Admission: RE | Admit: 2023-01-13 | Discharge: 2023-01-13 | Disposition: A | Discharge status: Home / Self Care | Payer: PPO | Source: Ambulatory Visit | Attending: Urology | Admitting: Urology

## 2023-01-13 DIAGNOSIS — Z01810 Encounter for preprocedural cardiovascular examination: Secondary | ICD-10-CM | POA: Diagnosis not present

## 2023-01-13 DIAGNOSIS — Z01818 Encounter for other preprocedural examination: Secondary | ICD-10-CM

## 2023-01-15 ENCOUNTER — Encounter
Admission: RE | Disposition: A | Discharge status: Home / Self Care | Payer: Self-pay | Source: Home / Self Care | Attending: Urology

## 2023-01-15 ENCOUNTER — Ambulatory Visit: Payer: PPO | Admitting: Anesthesiology

## 2023-01-15 ENCOUNTER — Encounter: Payer: Self-pay | Admitting: Urology

## 2023-01-15 ENCOUNTER — Other Ambulatory Visit: Payer: Self-pay

## 2023-01-15 ENCOUNTER — Ambulatory Visit
Admission: RE | Admit: 2023-01-15 | Discharge: 2023-01-15 | Disposition: A | Discharge status: Home / Self Care | Payer: PPO | Attending: Urology | Admitting: Urology

## 2023-01-15 DIAGNOSIS — Z96641 Presence of right artificial hip joint: Secondary | ICD-10-CM | POA: Diagnosis not present

## 2023-01-15 DIAGNOSIS — Z87891 Personal history of nicotine dependence: Secondary | ICD-10-CM | POA: Diagnosis not present

## 2023-01-15 DIAGNOSIS — Z85820 Personal history of malignant melanoma of skin: Secondary | ICD-10-CM | POA: Insufficient documentation

## 2023-01-15 DIAGNOSIS — N3020 Other chronic cystitis without hematuria: Secondary | ICD-10-CM | POA: Diagnosis not present

## 2023-01-15 DIAGNOSIS — N3090 Cystitis, unspecified without hematuria: Secondary | ICD-10-CM | POA: Diagnosis not present

## 2023-01-15 DIAGNOSIS — R3129 Other microscopic hematuria: Secondary | ICD-10-CM | POA: Insufficient documentation

## 2023-01-15 DIAGNOSIS — N3289 Other specified disorders of bladder: Secondary | ICD-10-CM

## 2023-01-15 HISTORY — PX: CYSTOSCOPY WITH BIOPSY: SHX5122

## 2023-01-15 SURGERY — CYSTOSCOPY, WITH BIOPSY
Anesthesia: General

## 2023-01-15 MED ORDER — OXYCODONE HCL 5 MG PO TABS: 5.0000 mg | ORAL_TABLET | Freq: Once | ORAL | Status: DC | PRN

## 2023-01-15 MED ORDER — LIDOCAINE HCL (CARDIAC) PF 100 MG/5ML IV SOSY
PREFILLED_SYRINGE | INTRAVENOUS | Status: DC | PRN
  Administered 2023-01-15: 40 mg via INTRAVENOUS

## 2023-01-15 MED ORDER — PROPOFOL 1000 MG/100ML IV EMUL
INTRAVENOUS | Status: AC
  Filled 2023-01-15: qty 100

## 2023-01-15 MED ORDER — FAMOTIDINE 20 MG PO TABS
20.0000 mg | ORAL_TABLET | Freq: Once | ORAL | Status: AC
  Administered 2023-01-15: 20 mg via ORAL

## 2023-01-15 MED ORDER — LIDOCAINE HCL (PF) 2 % IJ SOLN
INTRAMUSCULAR | Status: AC
  Filled 2023-01-15: qty 5

## 2023-01-15 MED ORDER — FENTANYL CITRATE (PF) 100 MCG/2ML IJ SOLN
INTRAMUSCULAR | Status: DC | PRN
  Administered 2023-01-15 (×2): 25 ug via INTRAVENOUS

## 2023-01-15 MED ORDER — LACTATED RINGERS IV SOLN: INTRAVENOUS | Status: DC

## 2023-01-15 MED ORDER — PROPOFOL 10 MG/ML IV BOLUS
INTRAVENOUS | Status: DC | PRN
  Administered 2023-01-15 (×2): 40 mg via INTRAVENOUS

## 2023-01-15 MED ORDER — CEFAZOLIN SODIUM-DEXTROSE 2-4 GM/100ML-% IV SOLN
INTRAVENOUS | Status: AC
  Filled 2023-01-15: qty 100

## 2023-01-15 MED ORDER — CEFAZOLIN SODIUM-DEXTROSE 2-4 GM/100ML-% IV SOLN
2.0000 g | INTRAVENOUS | Status: AC
  Administered 2023-01-15: 2 g via INTRAVENOUS

## 2023-01-15 MED ORDER — PROPOFOL 10 MG/ML IV BOLUS
INTRAVENOUS | Status: AC
  Filled 2023-01-15: qty 20

## 2023-01-15 MED ORDER — MIDAZOLAM HCL 2 MG/2ML IJ SOLN
INTRAMUSCULAR | Status: AC
  Filled 2023-01-15: qty 2

## 2023-01-15 MED ORDER — CHLORHEXIDINE GLUCONATE 0.12 % MT SOLN
15.0000 mL | Freq: Once | OROMUCOSAL | Status: AC
  Administered 2023-01-15: 15 mL via OROMUCOSAL

## 2023-01-15 MED ORDER — FENTANYL CITRATE (PF) 100 MCG/2ML IJ SOLN
INTRAMUSCULAR | Status: AC
  Filled 2023-01-15: qty 2

## 2023-01-15 MED ORDER — OXYCODONE HCL 5 MG/5ML PO SOLN: 5.0000 mg | Freq: Once | ORAL | Status: DC | PRN

## 2023-01-15 MED ORDER — FENTANYL CITRATE (PF) 100 MCG/2ML IJ SOLN: 25.0000 ug | INTRAMUSCULAR | Status: DC | PRN

## 2023-01-15 MED ORDER — MIDAZOLAM HCL 2 MG/2ML IJ SOLN
INTRAMUSCULAR | Status: DC | PRN
  Administered 2023-01-15: 2 mg via INTRAVENOUS

## 2023-01-15 MED ORDER — PROPOFOL 500 MG/50ML IV EMUL
INTRAVENOUS | Status: DC | PRN
  Administered 2023-01-15: 100 ug/kg/min via INTRAVENOUS

## 2023-01-15 MED ORDER — STERILE WATER FOR IRRIGATION IR SOLN
Status: DC | PRN
  Administered 2023-01-15: 1000 mL via INTRAVESICAL

## 2023-01-15 MED ORDER — ORAL CARE MOUTH RINSE: 15.0000 mL | Freq: Once | OROMUCOSAL | Status: AC

## 2023-01-15 MED ORDER — CHLORHEXIDINE GLUCONATE 0.12 % MT SOLN
OROMUCOSAL | Status: AC
  Filled 2023-01-15: qty 15

## 2023-01-15 MED ORDER — FAMOTIDINE 20 MG PO TABS
ORAL_TABLET | ORAL | Status: AC
  Filled 2023-01-15: qty 1

## 2023-01-15 SURGICAL SUPPLY — 17 items
BAG DRAIN SIEMENS DORNER NS (MISCELLANEOUS) ×1 IMPLANT
BAG DRN NS LF (MISCELLANEOUS) ×1
BRUSH SCRUB EZ 1% IODOPHOR (MISCELLANEOUS) IMPLANT
DRSG TELFA 3X4 N-ADH STERILE (GAUZE/BANDAGES/DRESSINGS) ×1 IMPLANT
ELECT REM PT RETURN 9FT ADLT (ELECTROSURGICAL) ×1
ELECTRODE REM PT RTRN 9FT ADLT (ELECTROSURGICAL) ×1 IMPLANT
GLOVE BIO SURGEON STRL SZ 6.5 (GLOVE) ×1 IMPLANT
GOWN STRL REUS W/ TWL LRG LVL3 (GOWN DISPOSABLE) ×2 IMPLANT
GOWN STRL REUS W/TWL LRG LVL3 (GOWN DISPOSABLE) ×2
KIT TURNOVER CYSTO (KITS) ×1 IMPLANT
NDL SAFETY ECLIP 18X1.5 (MISCELLANEOUS) ×1 IMPLANT
PACK CYSTO AR (MISCELLANEOUS) ×1 IMPLANT
SET CYSTO W/LG BORE CLAMP LF (SET/KITS/TRAYS/PACK) ×1 IMPLANT
SURGILUBE 2OZ TUBE FLIPTOP (MISCELLANEOUS) ×1 IMPLANT
WATER STERILE IRR 1000ML POUR (IV SOLUTION) ×1 IMPLANT
WATER STERILE IRR 3000ML UROMA (IV SOLUTION) ×1 IMPLANT
WATER STERILE IRR 500ML POUR (IV SOLUTION) ×1 IMPLANT

## 2023-01-15 NOTE — Op Note (Signed)
Date of procedure: 01/15/23  Preoperative diagnosis:  Microscopic hematuria Bladder erythema  Postoperative diagnosis:  Microscopic hematuria  Procedure: Cystoscopy with bladder biopsy  Surgeon: [name], MD  Anesthesia: MAC  Complications: None  Intraoperative findings: Fairly unremarkable bladder today, erythema is no longer appreciated.  No lesions.  EBL: Minimal  Specimens: bladder biopsy, random x 3  Drains: None  Indication: Kathryn Hawkins is a 67 y.o. patient with a history of microscopic hematuria who underwent CT urogram and cystoscopy.  Cystoscopy revealed an area of distinct erythema concerning for cystitis versus CIS.Marland Kitchen  Afster reviewing the management options for treatment, he elected to proceed with the above surgical procedure(s). We have discussed the potential benefits and risks of the procedure, side effects of the proposed treatment, the likelihood of the patient achieving the goals of the procedure, and any potential problems that might occur during the procedure or recuperation. Informed consent has been obtained.  Description of procedure:  The patient was taken to the operating room and sedation was administered.The patient was placed in the dorsal lithotomy position, prepped and draped in the usual sterile fashion, and preoperative antibiotics were administered. A preoperative time-out was performed.   A 21 French cystoscope was advanced per urethra into the bladder.  The bladder was carefully inspected.  Today, the erythema was not appreciated.  There were no tumors masses or lesions.  On most precaution, I did go ahead and perform bladder biopsy x 3 on the lateral bladder walls and posterior bladder.  These were fulgurated using Bugbee electrocautery.  Hemostasis was excellent.  The bladder was then drained.  She was then cleaned and dried, repositioned in supine position, reversed of anesthesia, and taken the PACU in stable condition.  Plan: I will  call her with the pathology results.  I anticipate that will be benign.   [name], M.D.

## 2023-01-15 NOTE — Anesthesia Preprocedure Evaluation (Signed)
Anesthesia Evaluation  Patient identified by MRN, date of birth, ID band Patient awake    Reviewed: Allergy & Precautions, NPO status , Patient's Chart, lab work & pertinent test results  History of Anesthesia Complications Negative for: history of anesthetic complications  Airway Mallampati: III  TM Distance: <3 FB Neck ROM: full    Dental  (+) Chipped   Pulmonary neg shortness of breath, former smoker   Pulmonary exam normal        Cardiovascular Exercise Tolerance: Good (-) angina negative cardio ROS Normal cardiovascular exam     Neuro/Psych negative neurological ROS  negative psych ROS   GI/Hepatic negative GI ROS, Neg liver ROS,,,  Endo/Other  negative endocrine ROS    Renal/GU negative Renal ROS  negative genitourinary   Musculoskeletal   Abdominal   Peds  Hematology negative hematology ROS (+)   Anesthesia Other Findings Past Medical History: 2017: DDD (degenerative disc disease), lumbar No date: Diverticulitis No date: Dyspareunia in female No date: Herniated disc, cervical No date: Lumbar spinal stenosis 1994: Melanoma (HCC)     Comment:  Left leg. Level II, per patient 2016: Osteopenia No date: TMJ (dislocation of temporomandibular joint) No date: Vaginal dryness No date: Vulvar atrophy  Past Surgical History: 1994: ABDOMINAL HYSTERECTOMY     Comment:  with bladder tack No date: BLADDER SUSPENSION 03/24/2019: BREAST BIOPSY; Right     Comment:  [place] bx coil clip BENIGN BREAST TISSUE WITH               PSEUDO-ANGIOMATOUS STROMAL HYPERPLASIA, USUAL DUCTAL               HYPERPLASIA AND FOCAL FIBROADENOMATOID CHANGES 03/21/2021: BREAST BIOPSY; Right     Comment:  [place] bx mass, coil marker, dense fibrous stroma 03/21/2021: BREAST BIOPSY; Left     Comment:  stereo bx asymmetry, x marker, benign tissue 08/05/2017: CHOLECYSTECTOMY; N/A     Comment:  Procedure: LAPAROSCOPIC CHOLECYSTECTOMY;  Surgeon:                Ancil Linsey, MD;  Location: [facility] ORS;  Service:               General;  Laterality: N/A; No date: COLONOSCOPY     Comment:  2008, 2018 01/2014: CYSTOURETHROSCOPY     Comment:  urethrolysis transvaginal 1994: FLEXIBLE SIGMOIDOSCOPY 1974: KELOID EXCISION; Right 1994: LAPAROSCOPIC OVARIAN CYSTECTOMY; Right No date: MELANOMA EXCISION; Left     Comment:  leg 01/22/2021: TOTAL HIP ARTHROPLASTY; Right     Comment:  Procedure: TOTAL HIP ARTHROPLASTY ANTERIOR APPROACH;                Surgeon: Kennedy Bucker, MD;  Location: [facility] ORS;                Service: Orthopedics;  Laterality: Right; No date: TUBAL LIGATION     Reproductive/Obstetrics negative OB ROS                             Anesthesia Physical Anesthesia Plan  ASA: 2  Anesthesia Plan: General   Post-op Pain Management:    Induction: Intravenous  PONV Risk Score and Plan: Propofol infusion and TIVA  Airway Management Planned: Natural Airway and Nasal Cannula  Additional Equipment:   Intra-op Plan:   Post-operative Plan:   Informed Consent: I have reviewed the patients History and Physical, chart, labs and discussed the procedure including the risks, benefits and alternatives for  the proposed anesthesia with the patient or authorized representative who has indicated his/her understanding and acceptance.     Dental Advisory Given  Plan Discussed with: Anesthesiologist, CRNA and Surgeon  Anesthesia Plan Comments: (Patient consented for risks of anesthesia including but not limited to:  - adverse reactions to medications - risk of airway placement if required - damage to eyes, teeth, lips or other oral mucosa - nerve damage due to positioning  - sore throat or hoarseness - Damage to heart, brain, nerves, lungs, other parts of body or loss of life  Patient voiced understanding.)       Anesthesia Quick Evaluation

## 2023-01-15 NOTE — Transfer of Care (Signed)
Immediate Anesthesia Transfer of Care Note  Patient: Kathryn Hawkins  Procedure(s) Performed: CYSTOSCOPY WITH BLADDER BIOPSY  Patient Location: PACU  Anesthesia Type:General  Level of Consciousness: drowsy  Airway & Oxygen Therapy: Patient Spontanous Breathing and Patient connected to face mask oxygen  Post-op Assessment: Report given to RN and Post -op Vital signs reviewed and stable  Post vital signs: stable  Last Vitals:  Vitals Value Taken Time  BP 114/71 01/15/23 1337  Temp    Pulse 85 01/15/23 1339  Resp 18 01/15/23 1339  SpO2 99 % 01/15/23 1339  Vitals shown include unvalidated device data.  Last Pain:  Vitals:   01/15/23 0926  TempSrc: Oral  PainSc: 0-No pain         Complications: No notable events documented.

## 2023-01-15 NOTE — Discharge Instructions (Addendum)

## 2023-01-15 NOTE — Interval H&P Note (Signed)
History and Physical Interval Note:  01/15/2023 12:47 PM  Kathryn Hawkins  has presented today for surgery, with the diagnosis of Bladder Erythema.  The various methods of treatment have been discussed with the patient and family. After consideration of risks, benefits and other options for treatment, the patient has consented to  Procedure(s): CYSTOSCOPY WITH BLADDER BIOPSY (N/A) as a surgical intervention.  The patient's history has been reviewed, patient examined, no change in status, stable for surgery.  I have reviewed the patient's chart and labs.  Questions were answered to the patient's satisfaction.   RRR cTAB    [name]

## 2023-01-16 ENCOUNTER — Encounter: Payer: Self-pay | Admitting: Urology

## 2023-01-16 NOTE — Anesthesia Postprocedure Evaluation (Signed)
Anesthesia Post Note  Patient: Kathryn Hawkins  Procedure(s) Performed: CYSTOSCOPY WITH BLADDER BIOPSY  Patient location during evaluation: PACU Anesthesia Type: General Level of consciousness: awake and alert Pain management: pain level controlled Vital Signs Assessment: post-procedure vital signs reviewed and stable Respiratory status: spontaneous breathing, nonlabored ventilation, respiratory function stable and patient connected to nasal cannula oxygen Cardiovascular status: blood pressure returned to baseline and stable Postop Assessment: no apparent nausea or vomiting Anesthetic complications: no   No notable events documented.   Last Vitals:  Vitals:   01/15/23 1410 01/15/23 1434  BP:  110/76  Pulse: 77 77  Resp: 16 18  Temp:  36.6 C  SpO2: 100% 98%    Last Pain:  Vitals:   01/15/23 1434  TempSrc: Temporal  PainSc: 0-No pain                 Cleda Mccreedy Yuli Lanigan

## 2023-01-21 ENCOUNTER — Telehealth: Payer: Self-pay | Admitting: Urology

## 2023-01-21 NOTE — Telephone Encounter (Signed)
Left message to call back  

## 2023-01-21 NOTE — Telephone Encounter (Signed)
Surgical pathology was benign.  It just showed inflammatory changes which I suspected.  Recommend having your primary care recheck your urine next year, if there is still some microscopic blood, follow-up with [place].  If you have any further episodes of visible blood in your urine, return sooner.  [name], MD

## 2023-01-22 NOTE — Telephone Encounter (Signed)
Nothing to do to address inflammatory changes on bladder biopsy.  This could simply be a transient finding, especially given that the inflammatory changes seen on cystoscopy had resolved by the time of her procedure.

## 2023-01-22 NOTE — Telephone Encounter (Signed)
Patient advised. Patient was anxious about her results and I assured her at this time result was benign. Patient did want to know if there is something that causes the inflammatory changes? Is there anything patient should or should not do?

## 2023-01-22 NOTE — Telephone Encounter (Signed)
Patient advised.

## 2023-03-12 IMAGING — XA DG HIP (WITH PELVIS) OPERATIVE*R*
3 series · 12 of 12 positions shown · non-contrast
Comparison: None.

CLINICAL DATA: Right hip replacement.

EXAM:
OPERATIVE RIGHT HIP (WITH PELVIS IF PERFORMED)
TECHNIQUE: Fluoroscopic spot image(s) were submitted for interpretation
post-operatively.

[Series 1: ortho standard · 4 of 8 frames shown (1 of 3)]
[frame 1/8]
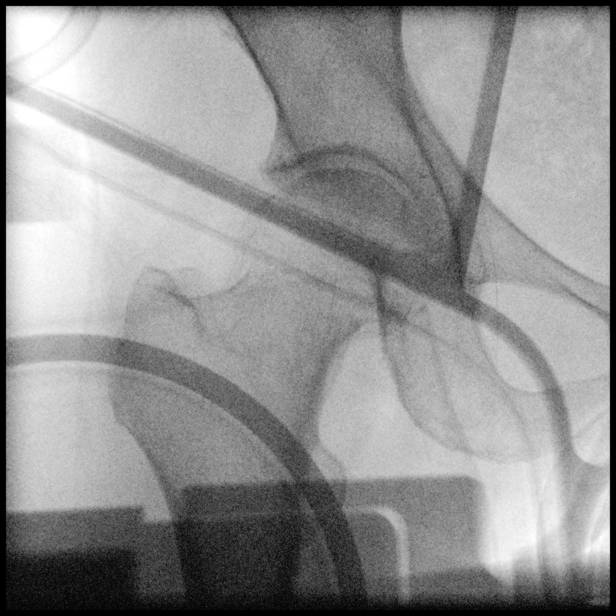
[frame 2/8]
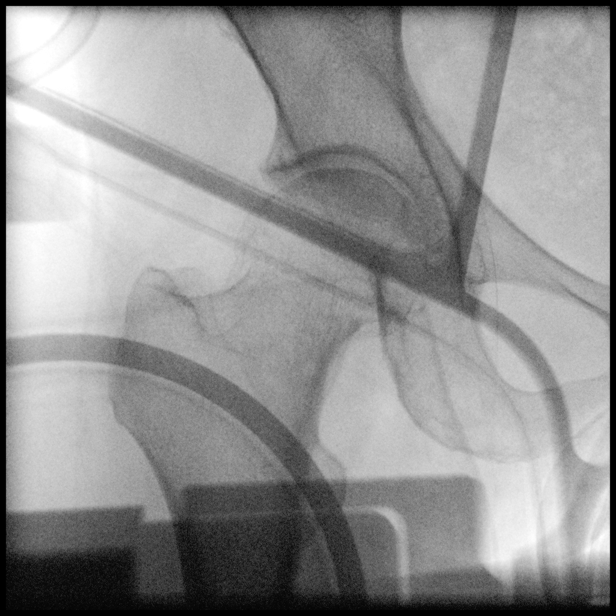
[frame 5/8]
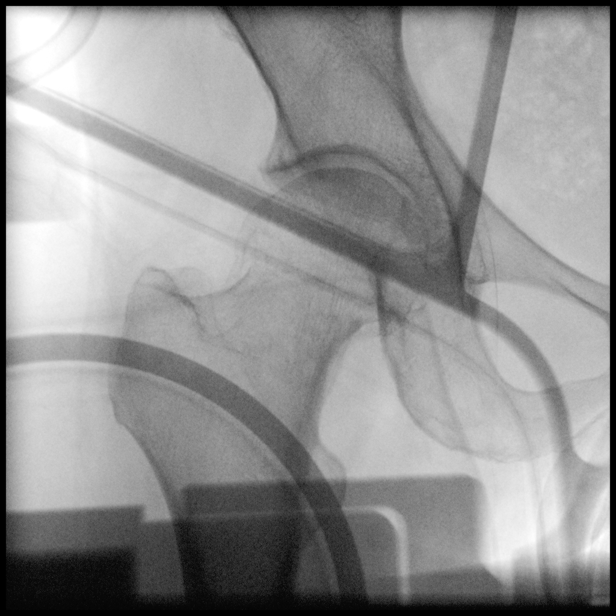
[frame 7/8]
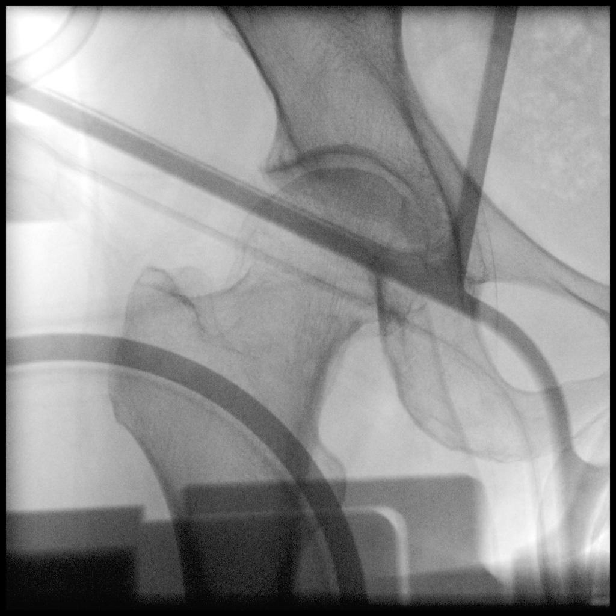

[Series 2: ortho standard · 4 of 9 frames shown (2 of 3)]
[frame 2/9]
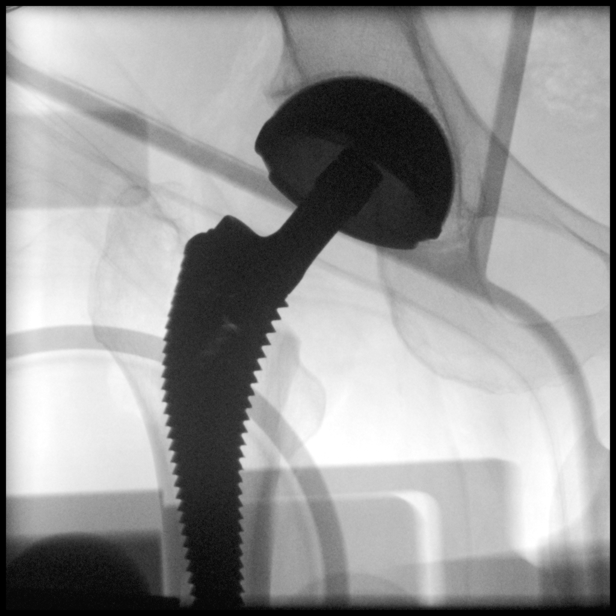
[frame 3/9]
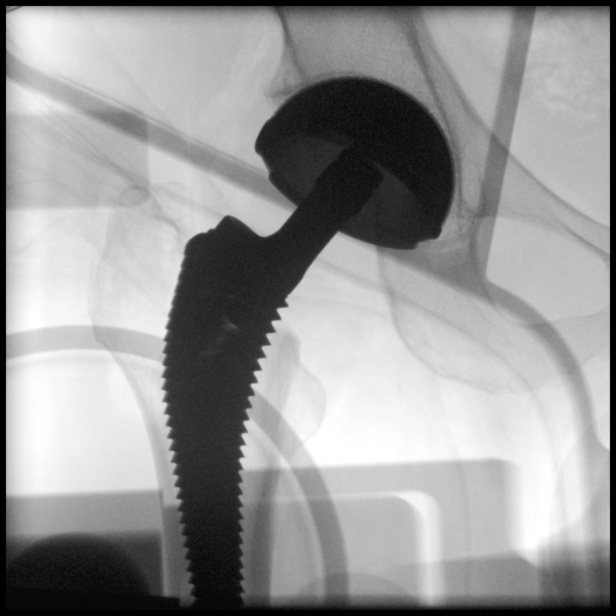
[frame 5/9]
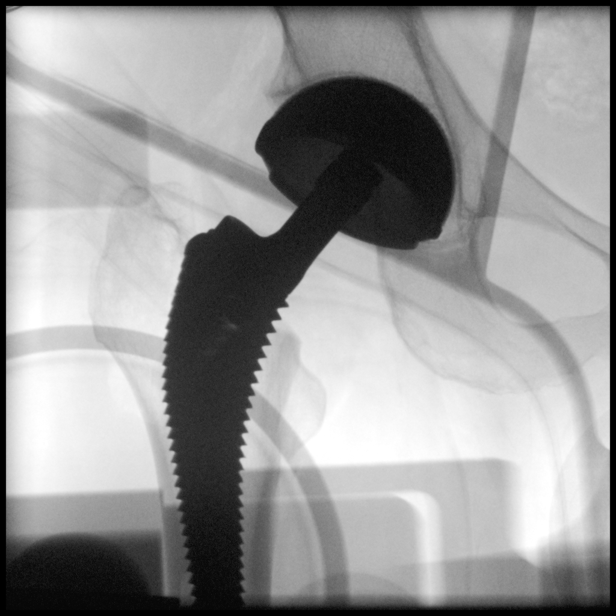
[frame 8/9]
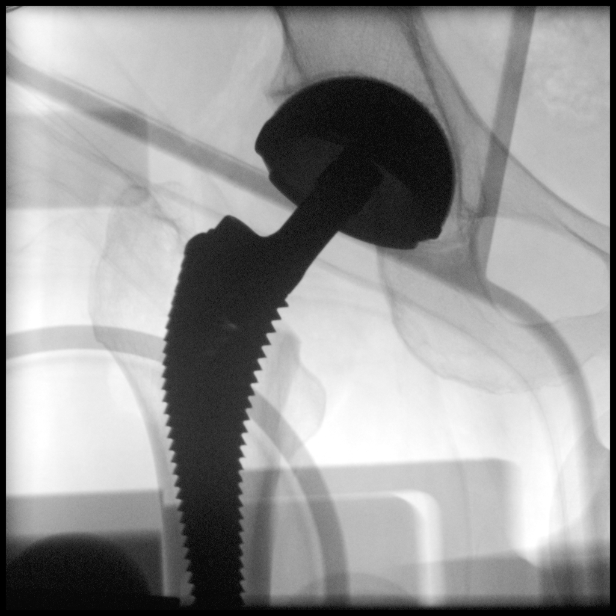

[Series 3: ortho standard · 4 of 9 frames shown (3 of 3)]
[frame 2/9]
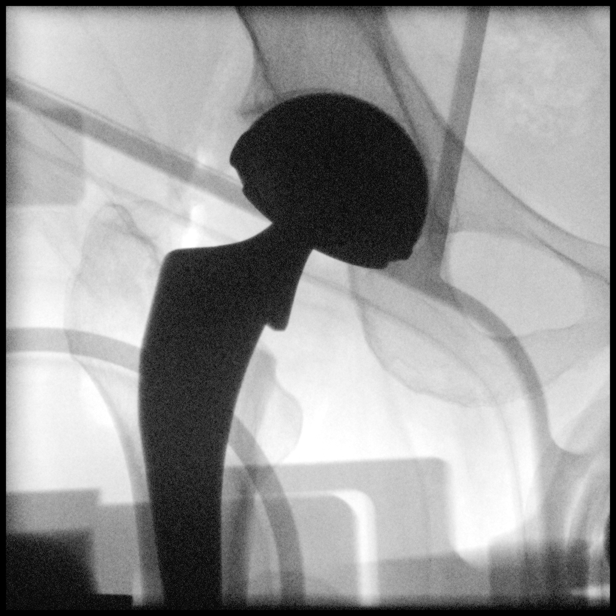
[frame 5/9]
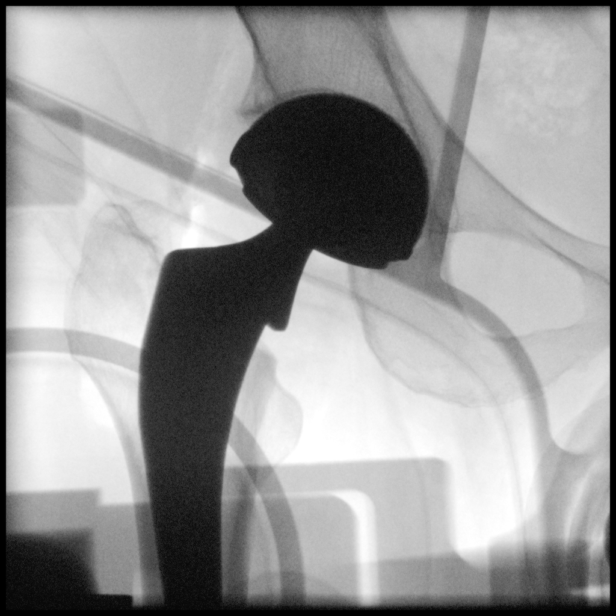
[frame 7/9]
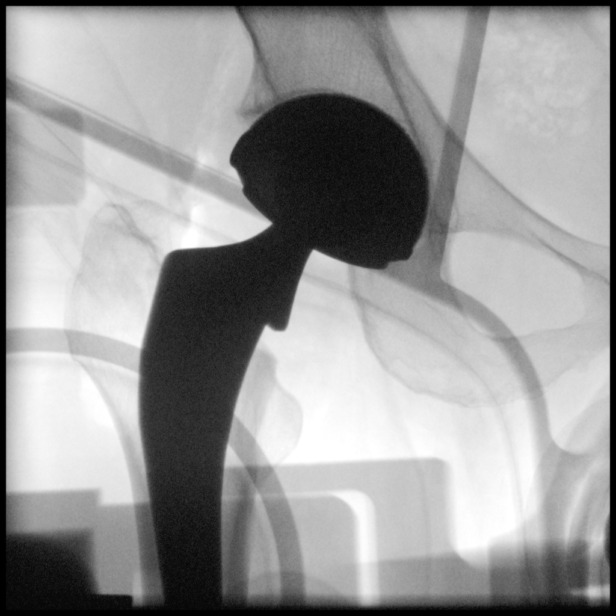
[frame 8/9]
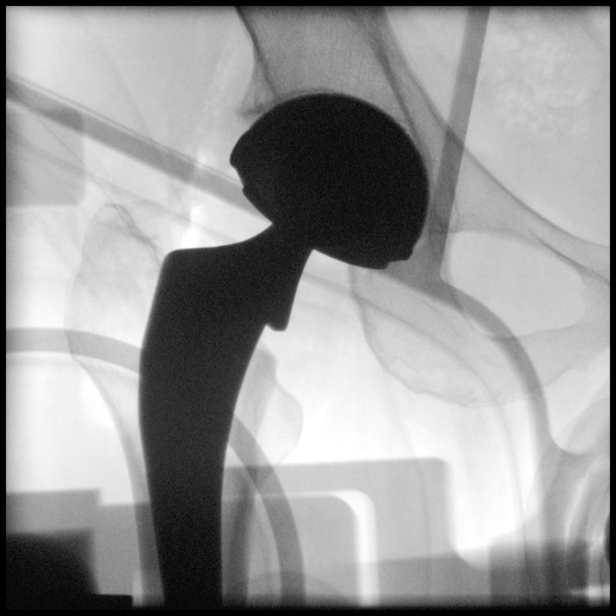

[12 of 12 positions shown; findings below may reference images not displayed]

FINDINGS: Three fluoroscopic spot views of the right hip in frontal projection
submitted during right hip arthroplasty. Fluoroscopy time 12
seconds.
IMPRESSION: Fluoroscopic spot views during right hip arthroplasty.

## 2023-03-19 ENCOUNTER — Other Ambulatory Visit: Payer: Self-pay | Admitting: Internal Medicine

## 2023-03-19 DIAGNOSIS — Z1231 Encounter for screening mammogram for malignant neoplasm of breast: Secondary | ICD-10-CM

## 2023-04-08 ENCOUNTER — Ambulatory Visit
Admission: RE | Admit: 2023-04-08 | Discharge: 2023-04-08 | Disposition: A | Discharge status: Home / Self Care | Payer: PPO | Source: Ambulatory Visit | Attending: Internal Medicine | Admitting: Internal Medicine

## 2023-04-08 DIAGNOSIS — Z1231 Encounter for screening mammogram for malignant neoplasm of breast: Secondary | ICD-10-CM | POA: Insufficient documentation

## 2023-04-13 ENCOUNTER — Other Ambulatory Visit: Payer: Self-pay | Admitting: Internal Medicine

## 2023-04-13 DIAGNOSIS — R928 Other abnormal and inconclusive findings on diagnostic imaging of breast: Secondary | ICD-10-CM

## 2023-04-15 ENCOUNTER — Ambulatory Visit
Admission: RE | Admit: 2023-04-15 | Discharge: 2023-04-15 | Disposition: A | Discharge status: Home / Self Care | Payer: PPO | Source: Ambulatory Visit | Attending: Internal Medicine | Admitting: Internal Medicine

## 2023-04-15 DIAGNOSIS — R928 Other abnormal and inconclusive findings on diagnostic imaging of breast: Secondary | ICD-10-CM

## 2023-04-15 DIAGNOSIS — R92321 Mammographic fibroglandular density, right breast: Secondary | ICD-10-CM | POA: Diagnosis not present

## 2023-10-07 ENCOUNTER — Ambulatory Visit: Payer: PPO | Admitting: Dermatology

## 2023-10-20 DIAGNOSIS — E782 Mixed hyperlipidemia: Secondary | ICD-10-CM | POA: Diagnosis not present

## 2023-10-27 DIAGNOSIS — R739 Hyperglycemia, unspecified: Secondary | ICD-10-CM | POA: Diagnosis not present

## 2023-10-27 DIAGNOSIS — Z780 Asymptomatic menopausal state: Secondary | ICD-10-CM | POA: Diagnosis not present

## 2023-10-27 DIAGNOSIS — E782 Mixed hyperlipidemia: Secondary | ICD-10-CM | POA: Diagnosis not present

## 2023-10-27 DIAGNOSIS — M810 Age-related osteoporosis without current pathological fracture: Secondary | ICD-10-CM | POA: Diagnosis not present

## 2023-10-27 DIAGNOSIS — Z0000 Encounter for general adult medical examination without abnormal findings: Secondary | ICD-10-CM | POA: Diagnosis not present

## 2023-12-08 ENCOUNTER — Ambulatory Visit: Payer: PPO | Admitting: Dermatology

## 2023-12-08 ENCOUNTER — Encounter: Payer: Self-pay | Admitting: Dermatology

## 2023-12-08 DIAGNOSIS — Z1283 Encounter for screening for malignant neoplasm of skin: Secondary | ICD-10-CM

## 2023-12-08 DIAGNOSIS — D229 Melanocytic nevi, unspecified: Secondary | ICD-10-CM | POA: Diagnosis not present

## 2023-12-08 DIAGNOSIS — L814 Other melanin hyperpigmentation: Secondary | ICD-10-CM

## 2023-12-08 DIAGNOSIS — D2239 Melanocytic nevi of other parts of face: Secondary | ICD-10-CM | POA: Diagnosis not present

## 2023-12-08 DIAGNOSIS — L578 Other skin changes due to chronic exposure to nonionizing radiation: Secondary | ICD-10-CM

## 2023-12-08 DIAGNOSIS — Z85820 Personal history of malignant melanoma of skin: Secondary | ICD-10-CM

## 2023-12-08 DIAGNOSIS — L821 Other seborrheic keratosis: Secondary | ICD-10-CM

## 2023-12-08 DIAGNOSIS — L570 Actinic keratosis: Secondary | ICD-10-CM | POA: Diagnosis not present

## 2023-12-08 DIAGNOSIS — W908XXA Exposure to other nonionizing radiation, initial encounter: Secondary | ICD-10-CM

## 2023-12-08 DIAGNOSIS — R21 Rash and other nonspecific skin eruption: Secondary | ICD-10-CM | POA: Diagnosis not present

## 2023-12-08 DIAGNOSIS — Z7189 Other specified counseling: Secondary | ICD-10-CM

## 2023-12-08 DIAGNOSIS — Z79899 Other long term (current) drug therapy: Secondary | ICD-10-CM

## 2023-12-08 DIAGNOSIS — L820 Inflamed seborrheic keratosis: Secondary | ICD-10-CM

## 2023-12-08 DIAGNOSIS — B958 Unspecified staphylococcus as the cause of diseases classified elsewhere: Secondary | ICD-10-CM

## 2023-12-08 DIAGNOSIS — L719 Rosacea, unspecified: Secondary | ICD-10-CM

## 2023-12-08 DIAGNOSIS — D492 Neoplasm of unspecified behavior of bone, soft tissue, and skin: Secondary | ICD-10-CM | POA: Diagnosis not present

## 2023-12-08 DIAGNOSIS — C4441 Basal cell carcinoma of skin of scalp and neck: Secondary | ICD-10-CM | POA: Diagnosis not present

## 2023-12-08 DIAGNOSIS — L219 Seborrheic dermatitis, unspecified: Secondary | ICD-10-CM

## 2023-12-08 DIAGNOSIS — D489 Neoplasm of uncertain behavior, unspecified: Secondary | ICD-10-CM

## 2023-12-08 MED ORDER — MUPIROCIN 2 % EX OINT
TOPICAL_OINTMENT | CUTANEOUS | 1 refills | Status: AC
Start: 2023-12-08 — End: ?

## 2023-12-08 NOTE — Patient Instructions (Addendum)
 If spot at left upper lip is not resolved in 2 months please call or send mychart  For spots at nose will continue to watch if change please call or send mychart  For painful bumps inside nose  Start mupirocin  2 % ointment - apply inside both nostrils twice daily for 2 weeks, then apply topically to both nostrils on weekends only at bedtime for another month. If resolved can stop.    Electrodesiccation and Curettage ("Scrape and Burn") Wound Care Instructions  Leave the original bandage on for 24 hours if possible.  If the bandage becomes soaked or soiled before that time, it is OK to remove it and examine the wound.  A small amount of post-operative bleeding is normal.  If excessive bleeding occurs, remove the bandage, place gauze over the site and apply continuous pressure (no peeking) over the area for 30 minutes. If this does not work, please call our clinic as soon as possible or page your doctor if it is after hours.   Once a day, cleanse the wound with soap and water . It is fine to shower. If a thick crust develops you may use a Q-tip dipped into dilute hydrogen peroxide (mix 1:1 with water ) to dissolve it.  Hydrogen peroxide can slow the healing process, so use it only as needed.    After washing, apply petroleum jelly (Vaseline) or an antibiotic ointment if your doctor prescribed one for you, followed by a bandage.    For best healing, the wound should be covered with a layer of ointment at all times. If you are not able to keep the area covered with a bandage to hold the ointment in place, this may mean re-applying the ointment several times a day.  Continue this wound care until the wound has healed and is no longer open. It may take several weeks for the wound to heal and close.  Itching and mild discomfort is normal during the healing process.  If you have any discomfort, you can take Tylenol  (acetaminophen ) or ibuprofen as directed on the bottle. (Please do not take these if you  have an allergy to them or cannot take them for another reason).  Some redness, tenderness and white or yellow material in the wound is normal healing.  If the area becomes very sore and red, or develops a thick yellow-green material (pus), it may be infected; please notify us .    Wound healing continues for up to one year following surgery. It is not unusual to experience pain in the scar from time to time during the interval.  If the pain becomes severe or the scar thickens, you should notify the office.    A slight amount of redness in a scar is expected for the first six months.  After six months, the redness will fade and the scar will soften and fade.  The color difference becomes less noticeable with time.  If there are any problems, return for a post-op surgery check at your earliest convenience.  To improve the appearance of the scar, you can use silicone scar gel, cream, or sheets (such as Mederma or Serica) every night for up to one year. These are available over the counter (without a prescription).  Please call our office at [phone] for any questions or concerns.  Biopsy Wound Care Instructions  Leave the original bandage on for 24 hours if possible.  If the bandage becomes soaked or soiled before that time, it is OK to remove it and examine  the wound.  A small amount of post-operative bleeding is normal.  If excessive bleeding occurs, remove the bandage, place gauze over the site and apply continuous pressure (no peeking) over the area for 30 minutes. If this does not work, please call our clinic as soon as possible or page your doctor if it is after hours.   Once a day, cleanse the wound with soap and water . It is fine to shower. If a thick crust develops you may use a Q-tip dipped into dilute hydrogen peroxide (mix 1:1 with water ) to dissolve it.  Hydrogen peroxide can slow the healing process, so use it only as needed.    After washing, apply petroleum jelly (Vaseline) or an  antibiotic ointment if your doctor prescribed one for you, followed by a bandage.    For best healing, the wound should be covered with a layer of ointment at all times. If you are not able to keep the area covered with a bandage to hold the ointment in place, this may mean re-applying the ointment several times a day.  Continue this wound care until the wound has healed and is no longer open.   Itching and mild discomfort is normal during the healing process. However, if you develop pain or severe itching, please call our office.   If you have any discomfort, you can take Tylenol  (acetaminophen ) or ibuprofen as directed on the bottle. (Please do not take these if you have an allergy to them or cannot take them for another reason).  Some redness, tenderness and white or yellow material in the wound is normal healing.  If the area becomes very sore and red, or develops a thick yellow-green material (pus), it may be infected; please notify us .    If you have stitches, return to clinic as directed to have the stitches removed. You will continue wound care for 2-3 days after the stitches are removed.   Wound healing continues for up to one year following surgery. It is not unusual to experience pain in the scar from time to time during the interval.  If the pain becomes severe or the scar thickens, you should notify the office.    A slight amount of redness in a scar is expected for the first six months.  After six months, the redness will fade and the scar will soften and fade.  The color difference becomes less noticeable with time.  If there are any problems, return for a post-op surgery check at your earliest convenience.  To improve the appearance of the scar, you can use silicone scar gel, cream, or sheets (such as Mederma or Serica) every night for up to one year. These are available over the counter (without a prescription).  Please call our office at [phone] for any questions or  concerns.       Seborrheic Keratosis  What causes seborrheic keratoses? Seborrheic keratoses are harmless, common skin growths that first appear during adult life.  As time goes by, more growths appear.  Some people may develop a large number of them.  Seborrheic keratoses appear on both covered and uncovered body parts.  They are not caused by sunlight.  The tendency to develop seborrheic keratoses can be inherited.  They vary in color from skin-colored to gray, brown, or even black.  They can be either smooth or have a rough, warty surface.   Seborrheic keratoses are superficial and look as if they were stuck on the skin.  Under the microscope this  type of keratosis looks like layers upon layers of skin.  That is why at times the top layer may seem to fall off, but the rest of the growth remains and re-grows.    Treatment Seborrheic keratoses do not need to be treated, but can easily be removed in the office.  Seborrheic keratoses often cause symptoms when they rub on clothing or jewelry.  Lesions can be in the way of shaving.  If they become inflamed, they can cause itching, soreness, or burning.  Removal of a seborrheic keratosis can be accomplished by freezing, burning, or surgery. If any spot bleeds, scabs, or grows rapidly, please return to have it checked, as these can be an indication of a skin cancer.   Cryotherapy Aftercare  Wash gently with soap and water  everyday.   Apply Vaseline and Band-Aid daily until healed.    Basic OTC daily skin care regimen to prevent photoaging:   Recommend facial moisturizer with sunscreen SPF 30 every morning (OTC brands include CeraVe AM, Neutrogena, Eucerin, Cetaphil, Aveeno, La Roche Posay).  Can also apply a topical Vit C serum which is an antioxidant (OTC brands include CeraVe, La Roche Posay, and The Ordinary) underneath sunscreen in morning. If you are outside during the day in the summer for extended periods, especially swimming and/or  sweating, make sure you apply a water  resistant facial sunscreen lotion spf 30 or higher.   At night recommend a cream with retinol (a vitamin A derivative which stimulates collagen production) like CeraVe skin renewing retinol serum or ROC retinol correxion cream or Neutrogena rapid wrinkle repair cream. Retinol may cause skin irritation in people with sensitive skin.  Can use it every other day and/or apply on top of a hyaluronic acid (HA) moisturizer/serum (Neutrogena Hydroboost water  cream) if better tolerated that way.  Retinol may also help with lightening brown spots.   Our office sells high quality, medically tested skin care lines such as Elta MD sunscreens (with Zinc), and Alastin skin care products, which are very effective in treating photoaging. The Alastin line includes cosmeceutical grade Vit.C serum, HA serum, Elastin stimulating moisturizers/serums, lightening serum, and sunscreens.  If you want prescription treatment, then you would need an appointment (Rx tretinoin and fade creams, Botox, filler injections, laser treatments, etc.) These prescriptions and procedures are not covered by insurance but work very well.     Melanoma ABCDEs  Melanoma is the most dangerous type of skin cancer, and is the leading cause of death from skin disease.  You are more likely to develop melanoma if you: Have light-colored skin, light-colored eyes, or red or blond hair Spend a lot of time in the sun Tan regularly, either outdoors or in a tanning bed Have had blistering sunburns, especially during childhood Have a close family member who has had a melanoma Have atypical moles or large birthmarks  Early detection of melanoma is key since treatment is typically straightforward and cure rates are extremely high if we catch it early.   The first sign of melanoma is often a change in a mole or a new dark spot.  The ABCDE system is a way of remembering the signs of melanoma.  A for asymmetry:  The  two halves do not match. B for border:  The edges of the growth are irregular. C for color:  A mixture of colors are present instead of an even brown color. D for diameter:  Melanomas are usually (but not always) greater than 6mm - the size of  a pencil eraser. E for evolution:  The spot keeps changing in size, shape, and color.  Please check your skin once per month between visits. You can use a small mirror in front and a large mirror behind you to keep an eye on the back side or your body.   If you see any new or changing lesions before your next follow-up, please call to schedule a visit.  Please continue daily skin protection including broad spectrum sunscreen SPF 30+ to sun-exposed areas, reapplying every 2 hours as needed when you're outdoors.   Staying in the shade or wearing long sleeves, sun glasses (UVA+UVB protection) and wide brim hats (4-inch brim around the entire circumference of the hat) are also recommended for sun protection.    Due to recent changes in healthcare laws, you may see results of your pathology and/or laboratory studies on MyChart before the doctors have had a chance to review them. We understand that in some cases there may be results that are confusing or concerning to you. Please understand that not all results are received at the same time and often the doctors may need to interpret multiple results in order to provide you with the best plan of care or course of treatment. Therefore, we ask that you please give us  2 business days to thoroughly review all your results before contacting the office for clarification. Should we see a critical lab result, you will be contacted sooner.   If You Need Anything After Your Visit  If you have any questions or concerns for your doctor, please call our main line at [phone] and press option 4 to reach your doctor's medical assistant. If no one answers, please leave a voicemail as directed and we will return your call as  soon as possible. Messages left after 4 pm will be answered the following business day.   You may also send us  a message via MyChart. We typically respond to MyChart messages within 1-2 business days.  For prescription refills, please ask your pharmacy to contact our office. Our fax number is [phone].  If you have an urgent issue when the clinic is closed that cannot wait until the next business day, you can page your doctor at the number below.    Please note that while we do our best to be available for urgent issues outside of office hours, we are not available 24/7.   If you have an urgent issue and are unable to reach us , you may choose to seek medical care at your doctor's office, retail clinic, urgent care center, or emergency room.  If you have a medical emergency, please immediately call 911 or go to the emergency department.  Pager Numbers  - Dr. Bary Likes: [phone]  - Dr. Annette Barters: [phone]  - Dr. Felipe Horton: [phone]   In the event of inclement weather, please call our main line at [phone] for an update on the status of any delays or closures.  Dermatology Medication Tips: Please keep the boxes that topical medications come in in order to help keep track of the instructions about where and how to use these. Pharmacies typically print the medication instructions only on the boxes and not directly on the medication tubes.   If your medication is too expensive, please contact our office at [phone] option 4 or send us  a message through MyChart.   We are unable to tell what your co-pay for medications will be in advance as this is different depending on your insurance coverage.  However, we may be able to find a substitute medication at lower cost or fill out paperwork to get insurance to cover a needed medication.   If a prior authorization is required to get your medication covered by your insurance company, please allow us  1-2 business days to complete this  process.  Drug prices often vary depending on where the prescription is filled and some pharmacies may offer cheaper prices.  The website www.goodrx.com contains coupons for medications through different pharmacies. The prices here do not account for what the cost may be with help from insurance (it may be cheaper with your insurance), but the website can give you the price if you did not use any insurance.  - You can print the associated coupon and take it with your prescription to the pharmacy.  - You may also stop by our office during regular business hours and pick up a GoodRx coupon card.  - If you need your prescription sent electronically to a different pharmacy, notify our office through [facility] or by phone at [phone] option 4.     Si Usted Necesita Algo Despus de Su Visita  Tambin puede enviarnos un mensaje a travs de Clinical cytogeneticist. Por lo general respondemos a los mensajes de MyChart en el transcurso de 1 a 2 das hbiles.  Para renovar recetas, por favor pida a su farmacia que se ponga en contacto con nuestra oficina. Franz Jacks de fax es [employer] [phone].  Si tiene un asunto urgente cuando la clnica est cerrada y que no puede esperar hasta el siguiente da hbil, puede llamar/localizar a su doctor(a) al nmero que aparece a continuacin.   Por favor, tenga en cuenta que aunque hacemos todo lo posible para estar disponibles para asuntos urgentes fuera del horario de [place], no estamos disponibles las 24 horas del da, los 7 [address]  [address] de la [place].   Si tiene un problema urgente y no puede comunicarse con nosotros, puede optar por buscar atencin mdica  en el consultorio de su doctor(a), en una clnica privada, en un centro de atencin urgente o en una sala de emergencias.  Si tiene Engineer, drilling, por favor llame inmediatamente al 911 o vaya a la sala de emergencias.  Nmeros de bper  - Dr. Bary Likes: [phone]  - Dra. Annette Barters: [phone]  - Dr.  Felipe Horton: [phone]   En caso de inclemencias del tiempo, por favor llame a Lajuan Pila principal al [phone] para una actualizacin sobre el [place] de cualquier retraso o cierre.  Consejos para la medicacin en dermatologa: Por favor, guarde las cajas en las que vienen los medicamentos de uso tpico para ayudarle a seguir las instrucciones sobre dnde y cmo usarlos. Las farmacias generalmente imprimen las instrucciones del medicamento slo en las cajas y no directamente en los tubos del [place].   Si su medicamento es muy caro, por favor, pngase en contacto con Bettyjane Brunet llamando al [phone] y presione la opcin 4 o envenos un mensaje a travs de Clinical cytogeneticist.   No podemos decirle cul ser su copago por los medicamentos por adelantado ya que esto es diferente dependiendo de la cobertura de su seguro. Sin embargo, es posible que podamos encontrar un medicamento sustituto a Audiological scientist un formulario para que el seguro cubra el medicamento que se considera necesario.   Si se requiere una autorizacin previa para que su compaa de seguros [place] su medicamento, por favor permtanos de 1 a 2 das hbiles para completar este proceso.  Los precios  de los medicamentos varan con frecuencia dependiendo del lugar de dnde se surte la receta y alguna farmacias pueden ofrecer precios ms baratos.  El sitio web www.goodrx.com tiene cupones para medicamentos de Health and safety inspector. Los precios aqu no tienen en cuenta lo que podra costar con la ayuda del seguro (puede ser ms barato con su seguro), pero el sitio web puede darle el precio si no utiliz Tourist information centre manager.  - Puede imprimir el cupn correspondiente y llevarlo con su receta a la farmacia.  - Tambin puede pasar por nuestra oficina durante el horario de atencin regular y Education officer, museum una tarjeta de cupones de GoodRx.  - Si necesita que su receta se enve electrnicamente a una farmacia diferente, informe a nuestra oficina a  travs de MyChart de [place] o por telfono llamando al [phone] y presione la opcin 4.

## 2023-12-08 NOTE — Progress Notes (Signed)
 Follow-Up Visit   Subjective  Kathryn Hawkins is a 68 y.o. female who presents for the following: Skin Cancer Screening and Full Body Skin Exam Spots at right scalp, spot at upper chest/neck, spot at upper left thigh  Pimples on inside of right nostril   The patient presents for Total-Body Skin Exam (TBSE) for skin cancer screening and mole check. The patient has spots, moles and lesions to be evaluated, some may be new or changing and the patient may have concern these could be cancer.  The following portions of the chart were reviewed this encounter and updated as appropriate: medications, allergies, medical history  Review of Systems:  No other skin or systemic complaints except as noted in HPI or Assessment and Plan.     Objective  Well appearing patient in no apparent distress; mood and affect are within normal limits.  A full examination was performed including scalp, head, eyes, ears, nose, lips, neck, chest, axillae, abdomen, back, buttocks, bilateral upper extremities, bilateral lower extremities, hands, feet, fingers, toes, fingernails, and toenails. All findings within normal limits unless otherwise noted below.   Relevant physical exam findings are noted in the Assessment and Plan.  right scalp x 1, left clavicle x 3, left thigh x 1 (5) Erythematous stuck-on, waxy papule or plaque anterior neck 1.2 cm pearly patch   left upper lip x 1 Erythematous thin papules/macules with gritty scale.    Assessment & Plan   SKIN CANCER SCREENING PERFORMED TODAY.  ACTINIC DAMAGE - Chronic condition, secondary to cumulative UV/sun exposure - diffuse scaly erythematous macules with underlying dyspigmentation - Recommend daily broad spectrum sunscreen SPF 30+ to sun-exposed areas, reapply every 2 hours as needed.  - Staying in the shade or wearing long sleeves, sun glasses (UVA+UVB protection) and wide brim hats (4-inch brim around the entire circumference of the hat) are also  recommended for sun protection.  - Call for new or changing lesions.  LENTIGINES, SEBORRHEIC KERATOSES, HEMANGIOMAS - Benign normal skin lesions - Benign-appearing - Call for any changes  Fibrous Papule at nasal tip and left lateral nose Exam: 2 mm fibrous papule at nasal tip and 3 mm fibrous papule at left lateral nose see photos  Will continue to watch, patient instructed to call or send mychart if she notices areas area growing larger.  Benign-appearing.  Observation.  Call clinic for new or changing lesions.    MELANOCYTIC NEVI - Tan-brown and/or pink-flesh-colored symmetric macules and papules - Benign appearing on exam today - Observation - Call clinic for new or changing moles - Recommend daily use of broad spectrum spf 30+ sunscreen to sun-exposed areas.   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  - Call if any new or changing lesions are noted between office visits  ROSACEA Exam Mid face erythema with telangiectasias Chronic condition with duration or expected duration over one year. Currently well-controlled. Rosacea is a chronic progressive skin condition usually affecting the face of adults, causing redness and/or acne bumps. It is treatable but not curable. It sometimes affects the eyes (ocular rosacea) as well. It may respond to topical and/or systemic medication and can flare with stress, sun exposure, alcohol, exercise, topical steroids (including hydrocortisone/cortisone 10) and some foods.  Daily application of broad spectrum spf 30+ sunscreen  to face is recommended to reduce flares. Patient denies grittiness of the eyes Treatment Plan Patient declined treatment Patient will call or send mychart if would like to restart skin medicinals rosacea  mix   SEBORRHEIC DERMATITIS Scalp , nasolabials  Exam: clear today at exam Chronic condition with duration or expected duration over one year. Currently well-controlled. Seborrheic Dermatitis is a chronic persistent rash characterized by pinkness and scaling most commonly of the mid face but also can occur on the scalp (dandruff), ears; mid chest, mid back and groin.  It tends to be exacerbated by stress and cooler weather.  People who have neurologic disease may experience new onset or exacerbation of existing seborrheic dermatitis.  The condition is not curable but treatable and can be controlled. Treatment Plan: Patient declined treatment  Will contact us  if she would like to restart ketoconazole  shampoo  Tinea pedis of both feet Chronic and persistent condition with duration or expected duration over one year. Condition is symptomatic / bothersome to patient. Not to goal. Tinea pedis/Tinea unguium Patient declined restarting treatment. Will call or send mychart if she would like to restart treatments.  Has used in the past Kerydin  apply to toenails at bedtime and Ketoconazole  cream nightly  Rash: HISTORY OF INTERMITTENT INTRANASAL PUSTULES (PROBABLE STAPHYLOCOCCUS NASAL COLONIZATION WITH INTERMITTENT FLARES)  Exam:  clear today. Treatment Plan: Start Mupirocin  2 % ointment - apply inside both nares twice daily for 2 weeks, then apply topically once daily to both nares at bedtime on weekends only for another month. If resolved patient instructed could stop.  If become flared again, patient instructed to restart treatment.   INFECTION, SKIN, STAPH   Related Medications mupirocin  ointment (BACTROBAN ) 2 % Apply topically twice daily to inside both nares. INFLAMED SEBORRHEIC KERATOSIS (5) right scalp x 1, left clavicle x 3, left thigh x 1 (5) Symptomatic, irritating, patient would like treated. Destruction of lesion - right scalp x 1, left clavicle x 3, left thigh x 1  (5) Complexity: simple   Destruction method: cryotherapy   Informed consent: discussed and consent obtained   Timeout:  patient name, date of birth, surgical site, and procedure verified Lesion destroyed using liquid nitrogen: Yes   Region frozen until ice ball extended beyond lesion: Yes   Outcome: patient tolerated procedure well with no complications   Post-procedure details: wound care instructions given   NEOPLASM OF UNCERTAIN BEHAVIOR anterior neck Epidermal / dermal shaving  Lesion diameter (cm):  1.2 Informed consent: discussed and consent obtained   Timeout: patient name, date of birth, surgical site, and procedure verified   Procedure prep:  Patient was prepped and draped in usual sterile fashion Prep type:  Isopropyl alcohol Anesthesia: the lesion was anesthetized in a standard fashion   Anesthetic:  1% lidocaine  w/ epinephrine  1-100,000 buffered w/ 8.4% NaHCO3 Instrument used: flexible razor blade   Hemostasis achieved with: pressure, aluminum chloride and electrodesiccation   Outcome: patient tolerated procedure well   Post-procedure details: sterile dressing applied and wound care instructions given   Dressing type: bandage and petrolatum    Destruction of lesion Complexity: extensive   Destruction method: electrodesiccation and curettage   Informed consent: discussed and consent obtained   Timeout:  patient name, date of birth, surgical site, and procedure verified Procedure prep:  Patient was prepped and draped in usual sterile fashion Prep type:  Isopropyl alcohol Anesthesia: the lesion was anesthetized in a standard fashion   Anesthetic:  1% lidocaine  w/ epinephrine  1-100,000 buffered w/ 8.4%  NaHCO3 Curettage performed in three different directions: Yes   Electrodesiccation performed over the curetted area: Yes   Lesion length (cm):  1.2 Lesion width (cm):  1.2 Margin per side (cm):  0.2 Final wound size (cm):  1.6 Hemostasis achieved with:  pressure,  aluminum chloride and electrodesiccation Outcome: patient tolerated procedure well with no complications   Post-procedure details: sterile dressing applied and wound care instructions given   Dressing type: bandage and petrolatum   Specimen 1 - Surgical pathology Differential Diagnosis: r/o bcc ED&C done   Check Margins: No R/o bcc  ACTINIC KERATOSIS left upper lip x 1 Patient advised to call or send mychart if area at left upper lip is not resolved in 2 months   Actinic keratoses are precancerous spots that appear secondary to cumulative UV radiation exposure/sun exposure over time. They are chronic with expected duration over 1 year. A portion of actinic keratoses will progress to squamous cell carcinoma of the skin. It is not possible to reliably predict which spots will progress to skin cancer and so treatment is recommended to prevent development of skin cancer.  Recommend daily broad spectrum sunscreen SPF 30+ to sun-exposed areas, reapply every 2 hours as needed.  Recommend staying in the shade or wearing long sleeves, sun glasses (UVA+UVB protection) and wide brim hats (4-inch brim around the entire circumference of the hat). Call for new or changing lesions. Destruction of lesion - left upper lip x 1 Complexity: simple   Destruction method: cryotherapy   Informed consent: discussed and consent obtained   Timeout:  patient name, date of birth, surgical site, and procedure verified Lesion destroyed using liquid nitrogen: Yes   Region frozen until ice ball extended beyond lesion: Yes   Outcome: patient tolerated procedure well with no complications   Post-procedure details: wound care instructions given   ACTINIC SKIN DAMAGE   HISTORY OF MALIGNANT MELANOMA   SKIN CANCER SCREENING   LENTIGO   MELANOCYTIC NEVUS, UNSPECIFIED LOCATION   ROSACEA   SEBORRHEIC DERMATITIS   RASH   COUNSELING AND COORDINATION OF CARE   MEDICATION MANAGEMENT   Return in about 1  year (around 12/07/2024) for TBSE.  IRandee Busing, CMA, am acting as scribe for Celine Collard, MD.   Documentation: I have reviewed the above documentation for accuracy and completeness, and I agree with the above.  Celine Collard, MD

## 2023-12-15 ENCOUNTER — Ambulatory Visit: Payer: Self-pay | Admitting: Dermatology

## 2023-12-15 ENCOUNTER — Encounter: Payer: Self-pay | Admitting: Dermatology

## 2023-12-15 DIAGNOSIS — C4491 Basal cell carcinoma of skin, unspecified: Secondary | ICD-10-CM

## 2023-12-15 HISTORY — DX: Basal cell carcinoma of skin, unspecified: C44.91

## 2023-12-15 LAB — SURGICAL PATHOLOGY

## 2023-12-15 NOTE — Telephone Encounter (Addendum)
 Called and discussed results with patient. She verbalized understanding and denied further questions. Will recheck at next followup  ----- Message from Celine Collard sent at 12/15/2023  5:00 PM EDT ----- FINAL DIAGNOSIS        1. Skin, anterior neck :       BASAL CELL CARCINOMA, SUPERFICIAL AND NODULAR PATTERNS   Cancer = BCC As suspected Already treated Recheck next visit

## 2024-03-04 ENCOUNTER — Other Ambulatory Visit: Payer: Self-pay | Admitting: Internal Medicine

## 2024-03-04 DIAGNOSIS — H43813 Vitreous degeneration, bilateral: Secondary | ICD-10-CM | POA: Diagnosis not present

## 2024-03-04 DIAGNOSIS — D3131 Benign neoplasm of right choroid: Secondary | ICD-10-CM | POA: Diagnosis not present

## 2024-03-04 DIAGNOSIS — Z1231 Encounter for screening mammogram for malignant neoplasm of breast: Secondary | ICD-10-CM

## 2024-03-04 DIAGNOSIS — H2513 Age-related nuclear cataract, bilateral: Secondary | ICD-10-CM | POA: Diagnosis not present

## 2024-03-16 DIAGNOSIS — K2100 Gastro-esophageal reflux disease with esophagitis, without bleeding: Secondary | ICD-10-CM | POA: Diagnosis not present

## 2024-03-16 DIAGNOSIS — R0789 Other chest pain: Secondary | ICD-10-CM | POA: Diagnosis not present

## 2024-03-16 DIAGNOSIS — K5792 Diverticulitis of intestine, part unspecified, without perforation or abscess without bleeding: Secondary | ICD-10-CM | POA: Diagnosis not present

## 2024-03-23 ENCOUNTER — Other Ambulatory Visit: Payer: Self-pay | Admitting: Internal Medicine

## 2024-03-23 ENCOUNTER — Ambulatory Visit
Admission: RE | Admit: 2024-03-23 | Discharge: 2024-03-23 | Disposition: A | Discharge status: Home / Self Care | Source: Ambulatory Visit | Attending: Internal Medicine | Admitting: Internal Medicine

## 2024-03-23 DIAGNOSIS — R1031 Right lower quadrant pain: Secondary | ICD-10-CM | POA: Insufficient documentation

## 2024-03-23 DIAGNOSIS — K2100 Gastro-esophageal reflux disease with esophagitis, without bleeding: Secondary | ICD-10-CM | POA: Diagnosis not present

## 2024-03-23 DIAGNOSIS — K5792 Diverticulitis of intestine, part unspecified, without perforation or abscess without bleeding: Secondary | ICD-10-CM | POA: Diagnosis not present

## 2024-03-23 NOTE — Progress Notes (Signed)
 Patient Profile:   Kathryn Hawkins  is a 68 y.o.  female Chief Complaint  Patient presents with  . Follow-up      PROBLEM LIST: Past Medical History:  Diagnosis Date  . Calculus of gallbladder with acute cholecystitis without obstruction 08/04/2017  . DDD (degenerative disc disease), lumbar 03/25/2016   MRI  Progressive spinal stenosis L3/L4  . Gluteal tendinitis, right hip 01/27/2018  . History of anesthesia reaction    slow to wake up  . History of bladder suspension procedure- persistent  10/13/2013  . L4-L5 disc bulge    MILD BULGE L2-L3, L5-S1  . Lumbar spinal stenosis 01/27/2018  . Osteopenia 05/18/2015   Formatting of this note might be different from the original. Last BDS 05/25/14  . Pain in limb 08/14/2008   Formatting of this note might be different from the original. Qualifier: Diagnosis of  By: Georgina CMA, [place]  . Vitamin D  deficiency 10/13/2019   27, 2018 45, 2019    Past Surgical History:  Procedure Laterality Date  . MALIGNANT MELONOMA REMOVAL Left 1994   LEVEL 2-MALIGNANT MELANOMA REMOVAL (LT LEG)  . OOPHORECTOMY  1994  . FLEXIBLE SIGMOIDOSCOPY  11/15/1992   Normal Flex Sig  . HYSTERECTOMY  2004   and bladder tack  . COLONOSCOPY  03/19/2007   Int Hemorrhoids, Diverticulosis: CBF 03/2017; Sch'ed 04/17/2017  . REVISION/REMOVAL PROSTHETIC VAGINAL GRAFT N/A 01/06/2014   Procedure: REVISION (INCLUDING REMOVAL) OF PROSTHETIC VAGINAL GRAFT; VAGINAL APPROACH;  Surgeon: Donald JAYSON Lowenstein, MD;  Location: [facility];  Service: Gynecology;  Laterality: N/A;  . URETHROLYSIS TRANSVAGINAL W/CYSTOURETHROSCOPY N/A 01/06/2014   Procedure: URETHROLYSIS, TRANSVAGINAL, SECONDARY, OPEN, INCLUDING CYSTOURETHROSCOPY (EG, POSTSURGICAL OBSTRUCTION, SCARRING);  Surgeon: Donald JAYSON Lowenstein, MD;  Location: [facility] OR;  Service: Gynecology;  Laterality: N/A;  . COLONOSCOPY  05/28/2017   Int Hemorrhoids, Diverticulosis: CBF 05/2027  . REPLACEMENT TOTAL HIP W/   RESURFACING IMPLANTS Right 01/22/2021   Kathryn Hawkins  . CYSTECTOMY OVARY LAPAROSCOPIC Right    1994  . KELOID Right 1974-75   REMOVAL OF KELOID RT FOREFINGER  . TUBAL LIGATION     Prior to 1991    ALLERGIES: Allergies  Allergen Reactions  . Cobalt Rash  . Nickel Rash  . Thimerosal Rash    CURRENT MEDICATIONS: Current Outpatient Medications  Medication Sig Dispense Refill  . amoxicillin-clavulanate (AUGMENTIN) 500-125 mg tablet Take 1 tablet (500 mg total) by mouth 3 (three) times daily for 7 days 21 tablet 0  . buPROPion  (WELLBUTRIN  XL) 150 MG XL tablet Take 1 tablet (150 mg total) by mouth once daily 90 tablet 3  . CALCIUM ORAL Take by mouth once daily With magnesium  & vitamin d       . cyclobenzaprine  (FLEXERIL ) 5 MG tablet TAKE 1 TABLET BY MOUTH 3 TIMES DAILY AS NEEDED FOR MUSCLE SPASMS 90 tablet 11  . estradioL  (ESTRACE ) 0.5 MG tablet Take 1 tablet (0.5 mg total) by mouth once daily 90 tablet 3  . etodolac (LODINE) 400 MG tablet TAKE ONE TABLET BY MOUTH DAILY WITH BREAKFAST 90 tablet 3  . Herbal Supplement Herbal Name: Glucosamine and chondrotin    . Lactobacillus acidophilus (PROBIOTIC ORAL) Take by mouth One daily    . montelukast (SINGULAIR) 10 mg tablet Take 1 tablet (10 mg total) by mouth every evening 90 tablet 2  . multivitamin with minerals tablet Take  1 tablet by mouth once daily    . pantoprazole  (PROTONIX ) 40 MG DR tablet Take 1 tablet (40 mg total) by mouth once daily 30 tablet 11  . progesterone  (PROMETRIUM ) 100 MG capsule Take 1 capsule (100 mg total) by mouth once daily 90 capsule 3  . vitamin D3/vitamin K2, MK4, (K2 PLUS D3 ORAL) Take by mouth    . sucralfate (CARAFATE) 1 gram tablet Take 1 tablet (1 g total) by mouth 4 (four) times daily before meals and nightly 120 tablet 0   No current facility-administered medications for this visit.      HPI   CLINICAL SUMMARY:  Patient with a 2-week history of right mid quadrant abdominal pain, also having a lot of  burping, belching, epigastric pain.  Placed on Protonix  and Augmentin 1 week ago.  Really not much better.  Bowels moving okay.  Lab work a week ago essentially normal, white count 7000.  Still having quite a bit of discomfort in the right periumbilical area.  ROS: Review of systems is unremarkable for any active cardiac, respiratory, GI, GU, hematologic, neurologic, dermatologic, HEENT, or psychiatric symptoms except as noted above, 10 systems reviewed.  No fevers, chills, or constitutional symptoms.   PHYSICAL EXAM  Vital signs:  BP 118/66 (BP Location: Left upper arm, Patient Position: Sitting, BP Cuff Size: Adult)   Pulse 96   Ht 163.8 cm (5' 4.5)   Wt 59.7 kg (131 lb 9.6 oz)   LMP  (LMP Unknown)   SpO2 99%   BMI 22.24 kg/m  Body mass index is 22.24 kg/m.   Wt Readings from Last 3 Encounters:  03/23/24 59.7 kg (131 lb 9.6 oz)  03/16/24 60.6 kg (133 lb 9.6 oz)  10/27/23 59.6 kg (131 lb 6.4 oz)     BP Readings from Last 3 Encounters:  03/23/24 118/66  03/16/24 120/80  10/27/23 118/80    Constitutional: Acute distress Neck: supple, no thyromegaly, good ROM Respiratory:clear to auscultation, no rales or wheezes Cardiovascular:RRR, no murmur or gallop Abdominal:soft, reduced bowel sounds, mild to moderate right lower quadrant tenderness with questionable rebound, moderate epigastric tenderness Ext: no edema, good peripheral pulses Neuro: alert and oriented X 3, grossly nonfocal     ASSESSMENT/PLAN   Progressive/persistent abdominal pain-really not any better despite Augmentin and Protonix .  Patient reports postcholecystectomy.  CT abdomen today to look for appendicitis.  Not better with Augmentin. Epigastric pain-Protonix  added, no better, add Carafate before every meal nightly  Overall diet has been very bland  Dispo:   No follow-ups on file.

## 2024-03-31 DIAGNOSIS — R1013 Epigastric pain: Secondary | ICD-10-CM | POA: Diagnosis not present

## 2024-03-31 DIAGNOSIS — R1084 Generalized abdominal pain: Secondary | ICD-10-CM | POA: Diagnosis not present

## 2024-04-07 DIAGNOSIS — R1013 Epigastric pain: Secondary | ICD-10-CM | POA: Diagnosis not present

## 2024-04-07 DIAGNOSIS — M5116 Intervertebral disc disorders with radiculopathy, lumbar region: Secondary | ICD-10-CM | POA: Diagnosis not present

## 2024-04-08 ENCOUNTER — Ambulatory Visit
Admission: RE | Admit: 2024-04-08 | Discharge: 2024-04-08 | Disposition: A | Discharge status: Home / Self Care | Source: Ambulatory Visit | Attending: Internal Medicine | Admitting: Internal Medicine

## 2024-04-08 DIAGNOSIS — Z1231 Encounter for screening mammogram for malignant neoplasm of breast: Secondary | ICD-10-CM | POA: Diagnosis not present

## 2024-05-12 DIAGNOSIS — R1013 Epigastric pain: Secondary | ICD-10-CM | POA: Diagnosis not present

## 2024-05-12 DIAGNOSIS — K219 Gastro-esophageal reflux disease without esophagitis: Secondary | ICD-10-CM | POA: Diagnosis not present

## 2024-05-16 ENCOUNTER — Encounter
Admission: RE | Disposition: A | Discharge status: Home / Self Care | Payer: Self-pay | Source: Home / Self Care | Attending: Gastroenterology

## 2024-05-16 ENCOUNTER — Ambulatory Visit: Admitting: Anesthesiology

## 2024-05-16 ENCOUNTER — Encounter: Payer: Self-pay | Admitting: Gastroenterology

## 2024-05-16 ENCOUNTER — Ambulatory Visit
Admission: RE | Admit: 2024-05-16 | Discharge: 2024-05-16 | Disposition: A | Discharge status: Home / Self Care | Attending: Gastroenterology | Admitting: Gastroenterology

## 2024-05-16 DIAGNOSIS — R1013 Epigastric pain: Secondary | ICD-10-CM | POA: Diagnosis not present

## 2024-05-16 DIAGNOSIS — Z87891 Personal history of nicotine dependence: Secondary | ICD-10-CM | POA: Insufficient documentation

## 2024-05-16 DIAGNOSIS — R109 Unspecified abdominal pain: Secondary | ICD-10-CM | POA: Diagnosis not present

## 2024-05-16 DIAGNOSIS — K219 Gastro-esophageal reflux disease without esophagitis: Secondary | ICD-10-CM | POA: Insufficient documentation

## 2024-05-16 HISTORY — PX: ESOPHAGOGASTRODUODENOSCOPY: SHX5428

## 2024-05-16 SURGERY — EGD (ESOPHAGOGASTRODUODENOSCOPY)
Anesthesia: General

## 2024-05-16 MED ORDER — GLYCOPYRROLATE 0.2 MG/ML IJ SOLN
INTRAMUSCULAR | Status: DC | PRN
  Administered 2024-05-16: .2 mg via INTRAVENOUS

## 2024-05-16 MED ORDER — PHENYLEPHRINE 80 MCG/ML (10ML) SYRINGE FOR IV PUSH (FOR BLOOD PRESSURE SUPPORT)
PREFILLED_SYRINGE | INTRAVENOUS | Status: AC
  Filled 2024-05-16: qty 10

## 2024-05-16 MED ORDER — PROPOFOL 10 MG/ML IV BOLUS
INTRAVENOUS | Status: AC
  Filled 2024-05-16: qty 20

## 2024-05-16 MED ORDER — GLYCOPYRROLATE 0.2 MG/ML IJ SOLN
INTRAMUSCULAR | Status: AC
  Filled 2024-05-16: qty 1

## 2024-05-16 MED ORDER — LIDOCAINE HCL (CARDIAC) PF 100 MG/5ML IV SOSY
PREFILLED_SYRINGE | INTRAVENOUS | Status: DC | PRN
Start: 2024-05-16 — End: 2024-05-16
  Administered 2024-05-16: 60 mg via INTRAVENOUS

## 2024-05-16 MED ORDER — PROPOFOL 10 MG/ML IV BOLUS
INTRAVENOUS | Status: DC | PRN
  Administered 2024-05-16 (×2): 50 mg via INTRAVENOUS

## 2024-05-16 MED ORDER — LIDOCAINE HCL (PF) 2 % IJ SOLN
INTRAMUSCULAR | Status: AC
  Filled 2024-05-16: qty 5

## 2024-05-16 MED ORDER — PROPOFOL 500 MG/50ML IV EMUL
INTRAVENOUS | Status: DC | PRN
  Administered 2024-05-16: 75 ug/kg/min via INTRAVENOUS

## 2024-05-16 MED ORDER — SODIUM CHLORIDE 0.9 % IV SOLN
INTRAVENOUS | Status: DC
  Administered 2024-05-16: 20 mL/h via INTRAVENOUS

## 2024-05-16 MED ORDER — EPHEDRINE 5 MG/ML INJ
INTRAVENOUS | Status: AC
  Filled 2024-05-16: qty 5

## 2024-05-16 NOTE — H&P (Signed)
 Ruel Kung , MD [address], [place], [place], [address] Phone: [phone] Fax: [phone]  Primary Care Physician:  Cleotilde Oneil FALCON, MD   Pre-Procedure History & Physical: HPI:  Kathryn Hawkins is a 68 y.o. female is here for an endoscopy    Past Medical History:  Diagnosis Date   BCC (basal cell carcinoma of skin) 12/15/2023   anterior neck - treated with [facility]   DDD (degenerative disc disease), lumbar 2017   Diverticulitis    Dyspareunia in female    Herniated disc, cervical    Lumbar spinal stenosis    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, per patient   Osteopenia 2016   TMJ (dislocation of temporomandibular joint)    Vaginal dryness    Vulvar atrophy     Past Surgical History:  Procedure Laterality Date   ABDOMINAL HYSTERECTOMY  1994   with bladder tack   BLADDER SUSPENSION     BREAST BIOPSY Right 03/24/2019   us  bx coil clip BENIGN BREAST TISSUE WITH PSEUDO-ANGIOMATOUS STROMAL HYPERPLASIA, USUAL DUCTAL HYPERPLASIA AND FOCAL FIBROADENOMATOID CHANGES   BREAST BIOPSY Right 03/21/2021   us  bx mass, coil marker, dense fibrous stroma   BREAST BIOPSY Left 03/21/2021   stereo bx asymmetry, x marker, benign tissue   CHOLECYSTECTOMY N/A 08/05/2017   Procedure: LAPAROSCOPIC CHOLECYSTECTOMY;  Surgeon: Nicholaus Selinda Birmingham, MD;  Location: [facility] ORS;  Service: General;  Laterality: N/A;   COLONOSCOPY     2008, 2018   CYSTOSCOPY WITH BIOPSY N/A 01/15/2023   Procedure: CYSTOSCOPY WITH BLADDER BIOPSY;  Surgeon: Penne Knee, MD;  Location: [facility] ORS;  Service: Urology;  Laterality: N/A;   CYSTOURETHROSCOPY  01/2014   urethrolysis transvaginal   FLEXIBLE SIGMOIDOSCOPY  1994   KELOID EXCISION Right 1974   LAPAROSCOPIC OVARIAN CYSTECTOMY Right 1994   MELANOMA EXCISION Left    leg   TOTAL HIP ARTHROPLASTY Right 01/22/2021   Procedure: TOTAL HIP ARTHROPLASTY ANTERIOR APPROACH;  Surgeon: [name] Sharper, MD;  Location: [facility] ORS;  Service: Orthopedics;   Laterality: Right;   TUBAL LIGATION      Prior to Admission medications   Medication Sig Start Date End Date Taking? Authorizing Provider  CALCIUM-MAGNESIUM -VITAMIN D  PO Take 3 tablets by mouth daily.   Yes [provider]  cyclobenzaprine  (FLEXERIL ) 5 MG tablet Take 5 mg by mouth 3 (three) times daily as needed for muscle spasms.   Yes [provider]  ETODOLAC PO Take 400-800 mg by mouth daily.   Yes [provider]  Glucosamine-Chondroit-Vit C-Mn (GLUCOSAMINE CHONDR 1500 COMPLX PO) Take 2 tablets by mouth daily.   Yes [provider]  montelukast (SINGULAIR) 10 MG tablet Take 10 mg by mouth at bedtime as needed.   Yes [provider]  Multiple Vitamins-Minerals (MULTIVITAMIN WITH MINERALS) tablet Take 1 tablet by mouth daily.   Yes [provider]  mupirocin  ointment (BACTROBAN ) 2 % Apply topically twice daily to inside both nares. 12/08/23  Yes Hester Alm BROCKS, MD  Probiotic Product (PROBIOTIC DAILY PO) Take 1 capsule by mouth daily. 50 Billion   Yes [provider]  estradiol  (ESTRACE  VAGINAL) 0.1 MG/GM vaginal cream Place 1 Applicatorful vaginally at bedtime. Apply 1 gm per vagina every night for 2 weeks, then apply three times weekly. Patient not taking: Reported on 01/08/2023 01/10/19   Shambley, Melody N, CNM  estradiol  (ESTRACE ) 0.5 MG tablet TAKE ONE TABLET BY MOUTH EVERY DAY for 21 days then take none for  7 days Patient not taking: Reported on 01/15/2023 06/13/19   Sebastian Sham, CNM  progesterone  (PROMETRIUM ) 200 MG capsule TAKE 1 CAPSULE EVERY DAY Patient not taking: Reported on 01/15/2023 05/16/19   Lenon Delaine SAILOR, CNM    Allergies as of 05/12/2024 - Review Complete 12/08/2023  Allergen Reaction Noted   Cobalt  01/31/2020   Thimerosal Rash 12/12/2022   Nickel  01/31/2020   Penicillins Other (See Comments) 05/14/2015   Thimerosal (thiomersal)  01/31/2020    Family History  Problem Relation Age of Onset    Breast cancer Paternal Grandmother 95    Social History   Socioeconomic History   Marital status: Married    Spouse name: Elsie Cramp   Number of children: 2   Years of education: Not on file   Highest education level: Not on file  Occupational History   Not on file  Tobacco Use   Smoking status: Former    Current packs/day: 0.00    Types: Cigarettes    Start date: 26    Quit date: 1977    Years since quitting: 48.8    Passive exposure: Past   Smokeless tobacco: Never  Vaping Use   Vaping status: Never Used  Substance and Sexual Activity   Alcohol use: Yes    Comment: occasional   Drug use: No   Sexual activity: Yes  Other Topics Concern   Not on file  Social History Narrative   Not on file   Social Drivers of Health   Financial Resource Strain: Low Risk  (10/27/2023)   Received from [facility]   Overall Financial Resource Strain (CARDIA)    Difficulty of Paying Living Expenses: Not hard at all  Food Insecurity: No Food Insecurity (10/27/2023)   Received from [facility]   Hunger Vital Sign    Within the past 12 months, you worried that your food would run out before you got the money to buy more.: Never true    Within the past 12 months, the food you bought just didn't last and you didn't have money to get more.: Never true  Transportation Needs: No Transportation Needs (10/27/2023)   Received from [facility] - Transportation    In the past 12 months, has lack of transportation kept you from medical appointments or from getting medications?: No    Lack of Transportation (Non-Medical): No  Physical Activity: Not on file  Stress: Not on file  Social Connections: Not on file  Intimate Partner Violence: Not on file    Review of Systems: See HPI, otherwise negative ROS  Physical Exam: BP (!) 87/62   Pulse 72   Temp (!) 96 F (35.6 C) (Temporal)   Resp 20   Wt 60.2 kg   SpO2 100%   BMI 22.10  kg/m  General:   Alert,  pleasant and cooperative in NAD Head:  Normocephalic and atraumatic. Neck:  Supple; no masses or thyromegaly. Lungs:  Clear throughout to auscultation, normal respiratory effort.    Heart:  +S1, +S2, Regular rate and rhythm, No edema. Abdomen:  Soft, nontender and nondistended. Normal bowel sounds, without guarding, and without rebound.   Neurologic:  Alert and  oriented x4;  grossly normal neurologically.  Impression/Plan: Kathryn Hawkins is here for an endoscopy  to be performed for  evaluation of epigastric pain and reflux    Risks, benefits, limitations, and alternatives regarding endoscopy have been reviewed with the patient.  Questions  have been answered.  All parties agreeable.   Ruel Kung, MD  05/16/2024, 10:40 AM

## 2024-05-16 NOTE — Anesthesia Postprocedure Evaluation (Signed)
 Anesthesia Post Note  Patient: Kathryn Hawkins  Procedure(s) Performed: EGD (ESOPHAGOGASTRODUODENOSCOPY)  Patient location during evaluation: Endoscopy Anesthesia Type: General Level of consciousness: awake and alert Pain management: pain level controlled Vital Signs Assessment: post-procedure vital signs reviewed and stable Respiratory status: spontaneous breathing, nonlabored ventilation, respiratory function stable and patient connected to nasal cannula oxygen Cardiovascular status: blood pressure returned to baseline and stable Postop Assessment: no apparent nausea or vomiting Anesthetic complications: no   No notable events documented.   Last Vitals:  Vitals:   05/16/24 1107 05/16/24 1117  BP: (!) 105/58 (!) 83/45  Pulse: 69 76  Resp: 16 17  Temp:    SpO2: 99% 97%    Last Pain:  Vitals:   05/16/24 1117  TempSrc:   PainSc: 0-No pain                 Lendia LITTIE Mae

## 2024-05-16 NOTE — Transfer of Care (Signed)
 Immediate Anesthesia Transfer of Care Note  Patient: Kathryn Hawkins  Procedure(s) Performed: EGD (ESOPHAGOGASTRODUODENOSCOPY)  Patient Location: PACU  Anesthesia Type:General  Level of Consciousness: sedated  Airway & Oxygen Therapy: Patient Spontanous Breathing  Post-op Assessment: Report given to RN and Post -op Vital signs reviewed and stable  Post vital signs: Reviewed and stable  Last Vitals:  Vitals Value Taken Time  BP 120/89 05/16/24 11:00  Temp 35.9 C 05/16/24 10:57  Pulse 56 05/16/24 11:03  Resp 18 05/16/24 11:03  SpO2 98 % 05/16/24 11:03  Vitals shown include unfiled device data.  Last Pain:  Vitals:   05/16/24 1057  TempSrc: Temporal  PainSc: Asleep         Complications: No notable events documented.

## 2024-05-16 NOTE — Op Note (Signed)
 [facility] Gastroenterology Patient Name: Kathryn Hawkins Procedure Date: 05/16/2024 10:45 AM MRN: [id number] Account #: [id number] Date of Birth: [birth year] Admit Type: Outpatient Age: 68 Room: [facility] ENDO ROOM 1 Gender: Female Note Status: Finalized Instrument Name: Upper GI Scope [id number] Procedure:             Upper GI endoscopy Indications:           Abdominal pain, Follow-up of gastro-esophageal reflux                         disease Providers:             Ruel Kung MD, MD Referring MD:          Oneil PHEBE Pinal, MD (Referring MD) Medicines:             Monitored Anesthesia Care Complications:         No immediate complications. Procedure:             Pre-Anesthesia Assessment:                        - Prior to the procedure, a History and Physical was                         performed, and patient medications, allergies and                         sensitivities were reviewed. The patient's tolerance                         of previous anesthesia was reviewed.                        - The risks and benefits of the procedure and the                         sedation options and risks were discussed with the                         patient. All questions were answered and informed                         consent was obtained.                        - ASA Grade Assessment: II - A patient with mild                         systemic disease.                        After obtaining informed consent, the endoscope was                         passed under direct vision. Throughout the procedure,                         the patient's blood pressure, pulse, and oxygen                         saturations  were monitored continuously. The Endoscope                         was introduced through the mouth, and advanced to the                         third part of duodenum. The upper GI endoscopy was                         accomplished with ease. The patient tolerated the                          procedure well. Findings:      The esophagus was normal.      The stomach was normal.      The examined duodenum was normal.      The entire examined stomach was normal. Biopsies were taken with a cold       forceps for histology.      The cardia and gastric fundus were normal on retroflexion. Impression:            - Normal esophagus.                        - Normal stomach.                        - Normal examined duodenum.                        - Normal stomach. Biopsied. Recommendation:        - Await pathology results.                        - Discharge patient to home (with escort).                        - Resume previous diet.                        - Return to GI clinic as previously scheduled. Procedure Code(s):     --- Professional ---                        [phone], Esophagogastroduodenoscopy, flexible,                         transoral; with biopsy, single or multiple Diagnosis Code(s):     --- Professional ---                        R10.9, Unspecified abdominal pain                        K21.9, Gastro-esophageal reflux disease without                         esophagitis CPT copyright 2022 [employer]. All rights reserved. The codes documented in this report are preliminary and upon coder review may  be revised to meet current compliance requirements. Ruel Kung, MD Ruel Kung MD, MD 05/16/2024 10:54:57 AM This report has been signed electronically. Number of Addenda: 0 Note Initiated On:  05/16/2024 10:45 AM Estimated Blood Loss:  Estimated blood loss: none.      [facility]

## 2024-05-16 NOTE — Anesthesia Preprocedure Evaluation (Addendum)
 Anesthesia Evaluation  Patient identified by MRN, date of birth, ID band Patient awake    Reviewed: Allergy & Precautions, NPO status , Patient's Chart, lab work & pertinent test results  History of Anesthesia Complications Negative for: history of anesthetic complications  Airway Mallampati: I  TM Distance: >3 FB Neck ROM: full    Dental no notable dental hx.    Pulmonary former smoker   Pulmonary exam normal        Cardiovascular negative cardio ROS Normal cardiovascular exam     Neuro/Psych  Neuromuscular disease  negative psych ROS   GI/Hepatic negative GI ROS, Neg liver ROS,,,  Endo/Other  negative endocrine ROS    Renal/GU negative Renal ROS  negative genitourinary   Musculoskeletal   Abdominal   Peds  Hematology negative hematology ROS (+)   Anesthesia Other Findings Past Medical History: 12/15/2023: BCC (basal cell carcinoma of skin)     Comment:  anterior neck - treated with ED&C 2017: DDD (degenerative disc disease), lumbar No date: Diverticulitis No date: Dyspareunia in female No date: Herniated disc, cervical No date: Lumbar spinal stenosis 1994: Melanoma (HCC)     Comment:  Left leg. Level II, per patient 2016: Osteopenia No date: TMJ (dislocation of temporomandibular joint) No date: Vaginal dryness No date: Vulvar atrophy  Past Surgical History: 1994: ABDOMINAL HYSTERECTOMY     Comment:  with bladder tack No date: BLADDER SUSPENSION 03/24/2019: BREAST BIOPSY; Right     Comment:  us  bx coil clip BENIGN BREAST TISSUE WITH               PSEUDO-ANGIOMATOUS STROMAL HYPERPLASIA, USUAL DUCTAL               HYPERPLASIA AND FOCAL FIBROADENOMATOID CHANGES 03/21/2021: BREAST BIOPSY; Right     Comment:  us  bx mass, coil marker, dense fibrous stroma 03/21/2021: BREAST BIOPSY; Left     Comment:  stereo bx asymmetry, x marker, benign tissue 08/05/2017: CHOLECYSTECTOMY; N/A     Comment:  Procedure:  LAPAROSCOPIC CHOLECYSTECTOMY;  Surgeon:               Nicholaus Selinda Birmingham, MD;  Location: [facility] ORS;  Service:               General;  Laterality: N/A; No date: COLONOSCOPY     Comment:  2008, 2018 01/15/2023: CYSTOSCOPY WITH BIOPSY; N/A     Comment:  Procedure: CYSTOSCOPY WITH BLADDER BIOPSY;  Surgeon:               Penne Knee, MD;  Location: [facility] ORS;  Service:               Urology;  Laterality: N/A; 01/2014: CYSTOURETHROSCOPY     Comment:  urethrolysis transvaginal 1994: FLEXIBLE SIGMOIDOSCOPY 1974: KELOID EXCISION; Right 1994: LAPAROSCOPIC OVARIAN CYSTECTOMY; Right No date: MELANOMA EXCISION; Left     Comment:  leg 01/22/2021: TOTAL HIP ARTHROPLASTY; Right     Comment:  Procedure: TOTAL HIP ARTHROPLASTY ANTERIOR APPROACH;                Surgeon: [name] Sharper, MD;  Location: [facility] ORS;                Service: Orthopedics;  Laterality: Right; No date: TUBAL LIGATION  BMI    Body Mass Index: 22.10 kg/m      Reproductive/Obstetrics negative OB ROS  Anesthesia Physical Anesthesia Plan  ASA: 2  Anesthesia Plan: General   Post-op Pain Management: Minimal or no pain anticipated   Induction: Intravenous  PONV Risk Score and Plan: 2 and Propofol  infusion and TIVA  Airway Management Planned: Natural Airway and Nasal Cannula  Additional Equipment:   Intra-op Plan:   Post-operative Plan:   Informed Consent: I have reviewed the patients History and Physical, chart, labs and discussed the procedure including the risks, benefits and alternatives for the proposed anesthesia with the patient or authorized representative who has indicated his/her understanding and acceptance.     Dental Advisory Given  Plan Discussed with: Anesthesiologist, CRNA and Surgeon  Anesthesia Plan Comments: (Patient consented for risks of anesthesia including but not limited to:  - adverse reactions to medications - risk of airway placement  if required - damage to eyes, teeth, lips or other oral mucosa - nerve damage due to positioning  - sore throat or hoarseness - Damage to heart, brain, nerves, lungs, other parts of body or loss of life  Patient voiced understanding and assent.)         Anesthesia Quick Evaluation

## 2024-05-17 LAB — SURGICAL PATHOLOGY

## 2024-05-19 DIAGNOSIS — K3184 Gastroparesis: Secondary | ICD-10-CM | POA: Diagnosis not present

## 2024-05-19 DIAGNOSIS — Z23 Encounter for immunization: Secondary | ICD-10-CM | POA: Diagnosis not present

## 2024-05-19 DIAGNOSIS — K219 Gastro-esophageal reflux disease without esophagitis: Secondary | ICD-10-CM | POA: Diagnosis not present

## 2024-05-19 DIAGNOSIS — M48062 Spinal stenosis, lumbar region with neurogenic claudication: Secondary | ICD-10-CM | POA: Diagnosis not present

## 2024-08-31 ENCOUNTER — Other Ambulatory Visit: Payer: Self-pay | Admitting: Internal Medicine

## 2024-08-31 DIAGNOSIS — G959 Disease of spinal cord, unspecified: Secondary | ICD-10-CM

## 2024-09-01 ENCOUNTER — Ambulatory Visit
Admission: RE | Admit: 2024-09-01 | Discharge: 2024-09-01 | Disposition: A | Discharge status: Home / Self Care | Source: Ambulatory Visit | Attending: Internal Medicine | Admitting: Internal Medicine

## 2024-09-01 DIAGNOSIS — G959 Disease of spinal cord, unspecified: Secondary | ICD-10-CM | POA: Diagnosis present

## 2024-12-07 ENCOUNTER — Ambulatory Visit: Admitting: Dermatology
